# Patient Record
Sex: Female | Born: 1950 | Race: White | Hispanic: No | Marital: Married | Smoking: Former smoker
Health system: Southern US, Community
[De-identification: ages and names within clinical notes are randomized; demographics above are authoritative.]

## PROBLEM LIST (undated history)

## (undated) DIAGNOSIS — K219 Gastro-esophageal reflux disease without esophagitis: Secondary | ICD-10-CM

## (undated) DIAGNOSIS — H269 Unspecified cataract: Secondary | ICD-10-CM

## (undated) DIAGNOSIS — K635 Polyp of colon: Secondary | ICD-10-CM

## (undated) DIAGNOSIS — J329 Chronic sinusitis, unspecified: Secondary | ICD-10-CM

## (undated) DIAGNOSIS — N952 Postmenopausal atrophic vaginitis: Secondary | ICD-10-CM

## (undated) DIAGNOSIS — G5600 Carpal tunnel syndrome, unspecified upper limb: Secondary | ICD-10-CM

## (undated) DIAGNOSIS — T8859XA Other complications of anesthesia, initial encounter: Secondary | ICD-10-CM

## (undated) DIAGNOSIS — Z8719 Personal history of other diseases of the digestive system: Secondary | ICD-10-CM

## (undated) DIAGNOSIS — K439 Ventral hernia without obstruction or gangrene: Secondary | ICD-10-CM

## (undated) DIAGNOSIS — T753XXA Motion sickness, initial encounter: Secondary | ICD-10-CM

## (undated) DIAGNOSIS — E785 Hyperlipidemia, unspecified: Secondary | ICD-10-CM

## (undated) DIAGNOSIS — F419 Anxiety disorder, unspecified: Secondary | ICD-10-CM

## (undated) DIAGNOSIS — K5790 Diverticulosis of intestine, part unspecified, without perforation or abscess without bleeding: Secondary | ICD-10-CM

## (undated) DIAGNOSIS — Z9889 Other specified postprocedural states: Secondary | ICD-10-CM

## (undated) DIAGNOSIS — N6019 Diffuse cystic mastopathy of unspecified breast: Secondary | ICD-10-CM

## (undated) DIAGNOSIS — M1990 Unspecified osteoarthritis, unspecified site: Secondary | ICD-10-CM

## (undated) DIAGNOSIS — K2900 Acute gastritis without bleeding: Secondary | ICD-10-CM

## (undated) DIAGNOSIS — Z780 Asymptomatic menopausal state: Secondary | ICD-10-CM

## (undated) DIAGNOSIS — R112 Nausea with vomiting, unspecified: Secondary | ICD-10-CM

## (undated) DIAGNOSIS — F32A Depression, unspecified: Secondary | ICD-10-CM

## (undated) HISTORY — PX: COSMETIC SURGERY: SHX468

## (undated) HISTORY — DX: Ventral hernia without obstruction or gangrene: K43.9

## (undated) HISTORY — DX: Polyp of colon: K63.5

## (undated) HISTORY — PX: OTHER SURGICAL HISTORY: SHX169

## (undated) HISTORY — DX: Diffuse cystic mastopathy of unspecified breast: N60.19

## (undated) HISTORY — PX: TUBAL LIGATION: SHX77

## (undated) HISTORY — PX: APPENDECTOMY: SHX54

## (undated) HISTORY — DX: Unspecified cataract: H26.9

## (undated) HISTORY — DX: Postmenopausal atrophic vaginitis: N95.2

## (undated) HISTORY — PX: CATARACT EXTRACTION, BILATERAL: SHX1313

## (undated) HISTORY — DX: Anxiety disorder, unspecified: F41.9

## (undated) HISTORY — DX: Diverticulosis of intestine, part unspecified, without perforation or abscess without bleeding: K57.90

## (undated) HISTORY — DX: Chronic sinusitis, unspecified: J32.9

## (undated) HISTORY — PX: HERNIA REPAIR: SHX51

## (undated) HISTORY — DX: Gastro-esophageal reflux disease without esophagitis: K21.9

## (undated) HISTORY — PX: RENAL CYST EXCISION: SUR506

## (undated) HISTORY — DX: Hyperlipidemia, unspecified: E78.5

## (undated) HISTORY — DX: Asymptomatic menopausal state: Z78.0

## (undated) HISTORY — PX: BLEPHAROPLASTY: SUR158

## (undated) HISTORY — PX: EXCISION MORTON'S NEUROMA: SHX5013

## (undated) HISTORY — DX: Acute gastritis without bleeding: K29.00

## (undated) HISTORY — DX: Depression, unspecified: F32.A

## (undated) HISTORY — DX: Unspecified osteoarthritis, unspecified site: M19.90

---

## 2003-02-09 HISTORY — PX: ABDOMINAL HYSTERECTOMY: SHX81

## 2003-09-20 LAB — HM COLONOSCOPY: HM Colonoscopy: NORMAL

## 2004-03-12 ENCOUNTER — Ambulatory Visit: Payer: Self-pay | Admitting: Specialist

## 2006-08-24 ENCOUNTER — Ambulatory Visit: Payer: Self-pay | Admitting: Specialist

## 2008-09-19 ENCOUNTER — Ambulatory Visit: Payer: Self-pay | Admitting: Unknown Physician Specialty

## 2009-01-06 ENCOUNTER — Ambulatory Visit: Payer: Self-pay | Admitting: Unknown Physician Specialty

## 2010-12-08 LAB — HM MAMMOGRAPHY

## 2011-07-21 LAB — CBC AND DIFFERENTIAL
HCT: 42 % (ref 36–46)
Hemoglobin: 14.5 g/dL (ref 12.0–16.0)
Platelets: 226 10*3/uL (ref 150–399)
WBC: 7.3 10^3/mL

## 2011-07-21 LAB — LIPID PANEL: Triglycerides: 307 mg/dL — AB (ref 40–160)

## 2011-07-21 LAB — TSH: TSH: 2.39 u[IU]/mL (ref <=5.90)

## 2011-07-21 LAB — HEPATIC FUNCTION PANEL: AST: 49 U/L — AB (ref 13–35)

## 2011-07-30 LAB — HM PAP SMEAR

## 2011-11-16 ENCOUNTER — Other Ambulatory Visit: Payer: Self-pay | Admitting: *Deleted

## 2011-11-16 MED ORDER — ALPRAZOLAM 0.25 MG PO TABS: 0.2500 mg | ORAL_TABLET | Freq: Two times a day (BID) | ORAL | Status: DC | PRN

## 2011-11-16 NOTE — Telephone Encounter (Signed)
Rx was faxed on 11/12/2011 to pharmacy.

## 2011-11-18 NOTE — Telephone Encounter (Signed)
Pharmacy did not receive RX.  Verbal called to [facility] at [facility].

## 2012-01-05 ENCOUNTER — Telehealth: Payer: Self-pay | Admitting: Internal Medicine

## 2012-01-05 MED ORDER — ESTRADIOL 0.075 MG/24HR TD PTTW: 1.0000 | MEDICATED_PATCH | TRANSDERMAL | Status: DC

## 2012-01-05 NOTE — Telephone Encounter (Signed)
Refill request for vivelle dot 0.075 mg 24 hr. Pat #8 Sig: apply one patch twice a week as directed Patient has an appt on 02/29/12

## 2012-01-05 NOTE — Telephone Encounter (Signed)
Refilled script

## 2012-01-20 ENCOUNTER — Telehealth: Payer: Self-pay | Admitting: Internal Medicine

## 2012-01-20 NOTE — Telephone Encounter (Signed)
Refill request for burpropion xl 150 mg ter #90   Sig: take three tablets daily Patient has an appt. On 02/29/12

## 2012-01-20 NOTE — Telephone Encounter (Signed)
Please confirm with pt this is how she has been taking these meds and refill until her appt.

## 2012-01-20 NOTE — Telephone Encounter (Signed)
Refill request for alprazolam 0.25 mg tab #60 Sig: take 1 tablet twice a day as needed. Patient does have an appt. On 02/29/12

## 2012-01-21 NOTE — Telephone Encounter (Signed)
[facility] pharmacy . Patient confirmed those are the directions on he medication bottles.

## 2012-01-24 MED ORDER — ALPRAZOLAM 0.25 MG PO TABS: 0.2500 mg | ORAL_TABLET | Freq: Every day | ORAL | Status: DC | PRN

## 2012-01-24 MED ORDER — BUPROPION HCL ER (SR) 150 MG PO TB12: 150.0000 mg | ORAL_TABLET | Freq: Three times a day (TID) | ORAL | Status: DC

## 2012-01-24 NOTE — Telephone Encounter (Signed)
Sent in to pharmacy.  

## 2012-01-24 NOTE — Telephone Encounter (Signed)
Directions clarified with pharmacy.  (3/day on wellbutrin - not tid)

## 2012-01-27 ENCOUNTER — Other Ambulatory Visit: Payer: Self-pay | Admitting: *Deleted

## 2012-02-21 ENCOUNTER — Other Ambulatory Visit: Payer: Self-pay | Admitting: Internal Medicine

## 2012-02-21 MED ORDER — PANTOPRAZOLE SODIUM 40 MG PO TBEC: 40.0000 mg | DELAYED_RELEASE_TABLET | Freq: Every day | ORAL | Status: DC

## 2012-02-21 NOTE — Telephone Encounter (Signed)
Pantoprazole Sodium 40 mg  # 30

## 2012-02-21 NOTE — Telephone Encounter (Signed)
Sent in to pharmacy.  

## 2012-02-29 ENCOUNTER — Encounter: Payer: Self-pay | Admitting: Internal Medicine

## 2012-02-29 ENCOUNTER — Encounter: Payer: Self-pay | Admitting: *Deleted

## 2012-02-29 ENCOUNTER — Ambulatory Visit (INDEPENDENT_AMBULATORY_CARE_PROVIDER_SITE_OTHER): Payer: BC Managed Care – PPO | Admitting: Internal Medicine

## 2012-02-29 VITALS — BP 140/88 | HR 77 | Temp 98.4°F | Ht 61.0 in | Wt 151.5 lb

## 2012-02-29 DIAGNOSIS — R7989 Other specified abnormal findings of blood chemistry: Secondary | ICD-10-CM

## 2012-02-29 DIAGNOSIS — R945 Abnormal results of liver function studies: Secondary | ICD-10-CM

## 2012-02-29 DIAGNOSIS — E7800 Pure hypercholesterolemia, unspecified: Secondary | ICD-10-CM

## 2012-02-29 DIAGNOSIS — IMO0001 Reserved for inherently not codable concepts without codable children: Secondary | ICD-10-CM

## 2012-02-29 DIAGNOSIS — R030 Elevated blood-pressure reading, without diagnosis of hypertension: Secondary | ICD-10-CM

## 2012-03-05 ENCOUNTER — Encounter: Payer: Self-pay | Admitting: Internal Medicine

## 2012-03-05 DIAGNOSIS — R7989 Other specified abnormal findings of blood chemistry: Secondary | ICD-10-CM | POA: Insufficient documentation

## 2012-03-05 DIAGNOSIS — R945 Abnormal results of liver function studies: Secondary | ICD-10-CM | POA: Insufficient documentation

## 2012-03-05 DIAGNOSIS — E7800 Pure hypercholesterolemia, unspecified: Secondary | ICD-10-CM | POA: Insufficient documentation

## 2012-03-05 MED ORDER — ESTRADIOL 0.05 MG/24HR TD PTTW: 1.0000 | MEDICATED_PATCH | TRANSDERMAL | Status: DC

## 2012-03-05 NOTE — Assessment & Plan Note (Signed)
Recheck liver panel with next labs.   

## 2012-03-05 NOTE — Assessment & Plan Note (Signed)
Low cholesterol diet and exercise.  Check lipid panel.   

## 2012-03-05 NOTE — Progress Notes (Signed)
Subjective:    [name], female    DOB: 1950/04/17, 62 y.o.   MRN: [id number]  HPI 62 year old female with past history of abnormal liver function, diverticulosis and GERD who comes in today for a scheduled follow up.  She states she has been doing relatively well.  Has had problems with her left index finger.  Seeing Dr Deeann Saint.  Xray yesterday - ganglion joint, bone spur.  Has been on keflex and bactrim for two months.  Has been off abx x 1 week.  Still taking her wellbutrin.  Takes her xanax prn.  (averages 4-5x/week).  Seeing her therapist.  She has started exercising.  Working with a Psychologist, educational two days/week.  Has noticed some intermittent nipple soreness.  States this has occurred previously.  No breast tenderness.  No nipple discharge.  No palpable lumps or nodules.   Past Medical History  Diagnosis Date  . Atrophic vaginitis   . Postmenopausal   . Fibrocystic breast disease   . Sinusitis   . Abdominal wall hernia     secondary to large left renal cyst removal  . GERD (gastroesophageal reflux disease)   . Diverticulosis   . Hyperlipidemia   . Colon polyps     Current Outpatient Prescriptions on File Prior to Visit  Medication Sig Dispense Refill  . ALPRAZolam (XANAX) 0.25 MG tablet Take 0.25 mg by mouth 2 (two) times daily as needed.      Marland Kitchen buPROPion (WELLBUTRIN SR) 150 MG 12 hr tablet Take 1 tablet (150 mg total) by mouth 3 (three) times daily.  90 tablet  1  . estradiol (VIVELLE-DOT) 0.075 MG/24HR Place 1 patch onto the skin 2 (two) times a week.  8 patch  3  . fluticasone (FLONASE) 50 MCG/ACT nasal spray Place 2 sprays into the nose daily.      . pantoprazole (PROTONIX) 40 MG tablet Take 1 tablet (40 mg total) by mouth daily.  30 tablet  0    Review of Systems Patient denies any headache, lightheadedness or dizziness.  No significant sinus or allergy symptoms.   No chest pain, tightness or palpitations.  No increased shortness of breath, cough or congestion.   No nausea or vomiting.  No abdominal pain or cramping.  No bowel change, such as diarrhea, constipation, BRBPR or melana.  No urine change.        Objective:   Physical Exam Filed Vitals:   02/29/12 1534  BP: 140/88  Pulse: 77  Temp: 98.4 F (17.54 C)   62  year old female in no acute distress.   HEENT:  Nares- clear.  Oropharynx - without lesions. NECK:  Supple.  Nontender.  No audible bruit.  HEART:  Appears to be regular. LUNGS:  No crackles or wheezing audible.  Respirations even and unlabored.  RADIAL PULSE:  Equal bilaterally.    BREASTS:  She declined.   ABDOMEN:  Soft, nontender.  Bowel sounds present and normal.  No audible abdominal bruit.    EXTREMITIES:  No increased edema present.  DP pulses palpable and equal bilaterally.      FINGER:  Left index.  Increased fullness of the joint.  No evidence of infection.  Tender to palpation.      Assessment & Plan:  MSK.  Finger changes as outlined.  Seeing Dr Deeann Saint.  Follow.   With increased abx usage - probiotic.  Off abx now.  Follow.    NIPPLE SORENESS.  Discussed at length with  her today.  She declined breast exam.  States no change.  Will decrease the estrogen patch and try to wean her off.  Follow.  Last mammo - ok.   INCREASED PSYCHOSOCIAL STRESSORS.  Seeing Dr Dawayne Cirri.  On Wellbutrin and xanax prn.  Stable.  Follow.    ETOH ABUSE.  Discussed the need to decrease the amount.  Follow.  ELEVATED BLOOD PRESSURE WITHOUT DIAGNOSIS OF HYPERTENSION.  Blood pressure as outlined.  Have her spot check her readings.  Follow.   Check metabolic panel.    HEALTH MAINTENANCE.  Physical 06/09/11.  Colonoscopy 09/19/08 - internal hemorrhoids and diverticulosis.  Recommended follow up colonoscopy five years.  Obtain mammogram results for 2013.

## 2012-03-14 ENCOUNTER — Encounter: Payer: Self-pay | Admitting: Internal Medicine

## 2012-03-20 ENCOUNTER — Other Ambulatory Visit: Payer: Self-pay | Admitting: *Deleted

## 2012-03-21 MED ORDER — PANTOPRAZOLE SODIUM 40 MG PO TBEC: 40.0000 mg | DELAYED_RELEASE_TABLET | Freq: Every day | ORAL | Status: DC

## 2012-03-21 MED ORDER — BUPROPION HCL ER (SR) 150 MG PO TB12: 150.0000 mg | ORAL_TABLET | Freq: Three times a day (TID) | ORAL | Status: DC

## 2012-03-21 NOTE — Telephone Encounter (Signed)
Sent in to pharmacy.  

## 2012-03-22 MED ORDER — BUPROPION HCL ER (SR) 150 MG PO TB12: ORAL_TABLET | ORAL | Status: DC

## 2012-03-22 MED ORDER — ESTRADIOL 0.05 MG/24HR TD PTTW: MEDICATED_PATCH | TRANSDERMAL | Status: DC

## 2012-03-22 NOTE — Telephone Encounter (Signed)
Clarified wellbutrin directions.  Refill vivelle dot

## 2012-03-25 ENCOUNTER — Other Ambulatory Visit: Payer: Self-pay

## 2012-04-05 ENCOUNTER — Other Ambulatory Visit: Payer: Self-pay | Admitting: Internal Medicine

## 2012-04-05 MED ORDER — ALPRAZOLAM 0.25 MG PO TABS: 0.2500 mg | ORAL_TABLET | Freq: Two times a day (BID) | ORAL | Status: DC | PRN

## 2012-04-05 NOTE — Telephone Encounter (Signed)
Order placed for refill xanax x 1

## 2012-05-12 ENCOUNTER — Encounter: Payer: Self-pay | Admitting: Specialist

## 2012-05-19 ENCOUNTER — Other Ambulatory Visit: Payer: Self-pay | Admitting: *Deleted

## 2012-05-19 MED ORDER — BUPROPION HCL ER (SR) 150 MG PO TB12: ORAL_TABLET | ORAL | Status: DC

## 2012-05-19 NOTE — Telephone Encounter (Signed)
Please advise... Refill on Wellbutrin... Patient hasnt had labs done yet but is schedule for labs in May.

## 2012-05-23 ENCOUNTER — Other Ambulatory Visit: Payer: Self-pay | Admitting: Internal Medicine

## 2012-05-23 ENCOUNTER — Telehealth: Payer: Self-pay | Admitting: Internal Medicine

## 2012-05-23 MED ORDER — ALPRAZOLAM 0.25 MG PO TABS: 0.2500 mg | ORAL_TABLET | Freq: Two times a day (BID) | ORAL | Status: DC | PRN

## 2012-05-23 NOTE — Telephone Encounter (Signed)
Called in xanax .25mg  #60 with no refills and wellbutrin #90 with 3 refills.  To [facility].  Can notify pt.

## 2012-05-23 NOTE — Telephone Encounter (Signed)
Called pharmacy and clarified dosing and refills  See other phone message

## 2012-05-23 NOTE — Telephone Encounter (Signed)
Patient is leaving town tomorrow needing her prescriptions  buPROPion (WELLBUTRIN SR) 150 MG 12 hr tablet  #90  ALPRAZolam (XANAX) 0.25 MG tablet  #60

## 2012-05-23 NOTE — Telephone Encounter (Signed)
Please advise..... Patient wants refill on his prescriptions because he is leaving town tomorrow.  Bupropion (Wellbutrin SR) 150 mg 12 hr  tablet # 90  Alprazolom (Xanax) 0.25 mg tablet  [facility] in [place]

## 2012-05-23 NOTE — Telephone Encounter (Signed)
Pt notified that her prescriptions were to the pharmacy.

## 2012-06-23 ENCOUNTER — Other Ambulatory Visit (INDEPENDENT_AMBULATORY_CARE_PROVIDER_SITE_OTHER): Payer: BC Managed Care – PPO

## 2012-06-23 DIAGNOSIS — IMO0001 Reserved for inherently not codable concepts without codable children: Secondary | ICD-10-CM

## 2012-06-23 DIAGNOSIS — R030 Elevated blood-pressure reading, without diagnosis of hypertension: Secondary | ICD-10-CM

## 2012-06-23 DIAGNOSIS — E7800 Pure hypercholesterolemia, unspecified: Secondary | ICD-10-CM

## 2012-06-23 DIAGNOSIS — R7989 Other specified abnormal findings of blood chemistry: Secondary | ICD-10-CM

## 2012-06-23 DIAGNOSIS — R945 Abnormal results of liver function studies: Secondary | ICD-10-CM

## 2012-06-23 LAB — BASIC METABOLIC PANEL
BUN: 14 mg/dL (ref 6–23)
CO2: 23 mEq/L (ref 19–32)
GFR: 66.02 mL/min (ref >60.00)
Glucose, Bld: 94 mg/dL (ref 70–99)
Potassium: 4.3 mEq/L (ref 3.5–5.1)

## 2012-06-23 LAB — LIPID PANEL
Total CHOL/HDL Ratio: 4
VLDL: 53.2 mg/dL — ABNORMAL HIGH (ref 0.0–40.0)

## 2012-06-23 LAB — HEPATIC FUNCTION PANEL
Alkaline Phosphatase: 50 U/L (ref 39–117)
Bilirubin, Direct: 0.1 mg/dL (ref 0.0–0.3)
Total Bilirubin: 0.6 mg/dL (ref 0.3–1.2)

## 2012-06-27 ENCOUNTER — Ambulatory Visit (INDEPENDENT_AMBULATORY_CARE_PROVIDER_SITE_OTHER): Payer: BC Managed Care – PPO | Admitting: Internal Medicine

## 2012-06-27 ENCOUNTER — Encounter: Payer: Self-pay | Admitting: Internal Medicine

## 2012-06-27 VITALS — BP 110/70 | HR 79 | Temp 98.2°F | Ht 61.5 in | Wt 150.5 lb

## 2012-06-27 DIAGNOSIS — Z1211 Encounter for screening for malignant neoplasm of colon: Secondary | ICD-10-CM

## 2012-06-27 DIAGNOSIS — R945 Abnormal results of liver function studies: Secondary | ICD-10-CM

## 2012-06-27 DIAGNOSIS — Q619 Cystic kidney disease, unspecified: Secondary | ICD-10-CM

## 2012-06-27 DIAGNOSIS — E7800 Pure hypercholesterolemia, unspecified: Secondary | ICD-10-CM

## 2012-06-27 DIAGNOSIS — N281 Cyst of kidney, acquired: Secondary | ICD-10-CM

## 2012-06-27 DIAGNOSIS — Z0000 Encounter for general adult medical examination without abnormal findings: Secondary | ICD-10-CM

## 2012-06-27 DIAGNOSIS — R197 Diarrhea, unspecified: Secondary | ICD-10-CM

## 2012-06-27 DIAGNOSIS — R7989 Other specified abnormal findings of blood chemistry: Secondary | ICD-10-CM

## 2012-06-30 ENCOUNTER — Encounter: Payer: BC Managed Care – PPO | Admitting: Internal Medicine

## 2012-07-03 ENCOUNTER — Encounter: Payer: Self-pay | Admitting: Internal Medicine

## 2012-07-03 DIAGNOSIS — N281 Cyst of kidney, acquired: Secondary | ICD-10-CM | POA: Insufficient documentation

## 2012-07-03 NOTE — Assessment & Plan Note (Signed)
Low cholesterol diet and exercise.  Check lipid panel.   

## 2012-07-03 NOTE — Progress Notes (Signed)
Subjective:    [name], female    DOB: 07-22-1950, 62 y.o.   MRN: [id number]  HPI 62 year old female with past history of abnormal liver function, diverticulosis and GERD who comes in today to follow up on these issues as well as for a complete physical exam.  She states she has been doing relatively well.  Has had problems with her left index finger.  Has been seeing Dr Deeann Saint.  Told she had a ganglion joint and bone spur.  Was on keflex and bactrim for two months.  Just recently followed up with Dr Hyacinth Meeker.  He is referring her to a hand specialist and [facility] (Dr Victorino December).  Still taking her wellbutrin.  Takes her xanax prn.  (averages 4-5x/week).   Had noticed some intermittent nipple soreness last visit.  This has resolved.  Overall she feels things are stable.  Is having some persistent loose stool.     Past Medical History  Diagnosis Date  . Atrophic vaginitis   . Postmenopausal   . Fibrocystic breast disease   . Sinusitis   . Abdominal wall hernia     secondary to large left renal cyst removal  . GERD (gastroesophageal reflux disease)   . Diverticulosis   . Hyperlipidemia   . Colon polyps     Current Outpatient Prescriptions on File Prior to Visit  Medication Sig Dispense Refill  . ALPRAZolam (XANAX) 0.25 MG tablet Take 1 tablet (0.25 mg total) by mouth 2 (two) times daily as needed.  60 tablet  0  . buPROPion (WELLBUTRIN SR) 150 MG 12 hr tablet Take three tablets per day  90 tablet  1  . estradiol (VIVELLE-DOT) 0.05 MG/24HR Change one patch 2x/week as directed.  8 patch  3  . pantoprazole (PROTONIX) 40 MG tablet Take 1 tablet (40 mg total) by mouth daily.  30 tablet  5  . fluticasone (FLONASE) 50 MCG/ACT nasal spray Place 2 sprays into the nose daily.       No current facility-administered medications on file prior to visit.    Review of Systems Patient denies any headache, lightheadedness or dizziness.  No significant sinus or allergy symptoms.   No chest  pain, tightness or palpitations.  No increased shortness of breath, cough or congestion.  No nausea or vomiting.  No acid reflux.  No abdominal pain or cramping.  No bowel change such as constipation, BRBPR or melana.  Does report some persistent loose stool.  Had been on an increased amount of abx recently.  No urine change.  Nipple soreness has resolved.  Planning to f/u with urology 07/20/12.  Sees Dr Creed Copper at [facility]       Objective:   Physical Exam  Filed Vitals:   06/27/12 0833  BP: 110/70  Pulse: 79  Temp: 98.2 F (84.51 C)   62 year old female in no acute distress.   HEENT:  Nares- clear.  Oropharynx - without lesions. NECK:  Supple.  Nontender.  No audible bruit.  HEART:  Appears to be regular. LUNGS:  No crackles or wheezing audible.  Respirations even and unlabored.  RADIAL PULSE:  Equal bilaterally.    BREASTS:  No nipple discharge or nipple retraction present.  Could not appreciate any distinct nodules or axillary adenopathy.  ABDOMEN:  Soft, nontender.  Bowel sounds present and normal.  No audible abdominal bruit.  GU:  Normal external genitalia.  Vaginal vault without lesions.  S/p hysterectomy.  Could not appreciate any  adnexal masses or tenderness.   RECTAL:  Heme negative.   EXTREMITIES:  No increased edema present.  DP pulses palpable and equal bilaterally.          Assessment & Plan:  MSK.  Finger issues as outlined.  Has been seeing Dr Deeann Saint.  Planning to see a hand specialist at [facility].    NIPPLE SORENESS.  Resolved.     INCREASED PSYCHOSOCIAL STRESSORS.  Has been seeing Dr Dawayne Cirri.  On Wellbutrin and xanax prn.  Stable.  Follow.    ETOH ABUSE.  Again discussed the need to decrease the amount.  Follow.  ELEVATED BLOOD PRESSURE WITHOUT DIAGNOSIS OF HYPERTENSION.  Have her spot check her readings.  Follow.   Follow metabolic panel.    GI.  Persistent loose stool.  Given recent increased abx usage, will check stool for C. Diff.    HEALTH  MAINTENANCE.  Physical today.  Colonoscopy 09/19/08 - internal hemorrhoids and diverticulosis.  Recommended follow up colonoscopy five years.  IFOB today.  Mammogram 12/24/11 - Birads II.

## 2012-07-03 NOTE — Assessment & Plan Note (Signed)
Followed by urology.  Planning to f/u with urology [facility] - Dr Creed Copper) 6/121/4.  States will do ultrasound then.  Plans to get abdominal ultrasound as well so can evaluate kidney and liver - given increased LFTs.

## 2012-07-03 NOTE — Assessment & Plan Note (Signed)
Recheck liver panel with next labs.  Discussed with her my desire for further w/up including a f/u ultrasound.  Will obtain ultrasound from [facility].  She also informs me that her urologist will do an ultrasound at her f/u 07/20/12.  Will get them to also look at liver then.  Follow.

## 2012-08-12 ENCOUNTER — Other Ambulatory Visit: Payer: Self-pay | Admitting: Internal Medicine

## 2012-08-14 ENCOUNTER — Other Ambulatory Visit: Payer: Self-pay | Admitting: *Deleted

## 2012-08-14 NOTE — Telephone Encounter (Signed)
Okay to refill? (Pt aware to check with pharmacy on Wednesday)

## 2012-08-15 ENCOUNTER — Telehealth: Payer: Self-pay | Admitting: *Deleted

## 2012-08-15 MED ORDER — ALPRAZOLAM 0.25 MG PO TABS: 0.2500 mg | ORAL_TABLET | Freq: Two times a day (BID) | ORAL | Status: DC | PRN

## 2012-08-15 NOTE — Telephone Encounter (Signed)
**  Correction** Phoned in Xanax RX to [facility]

## 2012-08-15 NOTE — Telephone Encounter (Signed)
Refilled xanax #60 with no refills. Ok to call in.

## 2012-08-15 NOTE — Telephone Encounter (Signed)
Called in to [facility] 

## 2012-08-17 ENCOUNTER — Other Ambulatory Visit: Payer: Self-pay | Admitting: *Deleted

## 2012-09-15 ENCOUNTER — Other Ambulatory Visit: Payer: Self-pay | Admitting: *Deleted

## 2012-09-15 MED ORDER — BUPROPION HCL ER (SR) 150 MG PO TB12: ORAL_TABLET | ORAL | Status: DC

## 2012-09-21 ENCOUNTER — Other Ambulatory Visit: Payer: Self-pay | Admitting: *Deleted

## 2012-09-21 MED ORDER — PANTOPRAZOLE SODIUM 40 MG PO TBEC: 40.0000 mg | DELAYED_RELEASE_TABLET | Freq: Every day | ORAL | Status: DC

## 2012-10-17 ENCOUNTER — Other Ambulatory Visit: Payer: Self-pay | Admitting: *Deleted

## 2012-10-17 ENCOUNTER — Encounter: Payer: Self-pay | Admitting: Internal Medicine

## 2012-10-17 MED ORDER — ALPRAZOLAM 0.25 MG PO TABS: 0.2500 mg | ORAL_TABLET | Freq: Two times a day (BID) | ORAL | Status: DC | PRN

## 2012-10-17 MED ORDER — BUPROPION HCL ER (SR) 150 MG PO TB12: ORAL_TABLET | ORAL | Status: DC

## 2012-10-17 NOTE — Telephone Encounter (Signed)
Okay to refill? Has appt later this month (9/26) Can sign controlled substance contract at that time

## 2012-10-17 NOTE — Telephone Encounter (Signed)
Refilled wellbutrin #90 with no refills and xanax #69 with no refills.

## 2012-10-30 ENCOUNTER — Telehealth: Payer: Self-pay | Admitting: *Deleted

## 2012-10-30 ENCOUNTER — Other Ambulatory Visit (INDEPENDENT_AMBULATORY_CARE_PROVIDER_SITE_OTHER): Payer: BC Managed Care – PPO

## 2012-10-30 DIAGNOSIS — N281 Cyst of kidney, acquired: Secondary | ICD-10-CM

## 2012-10-30 DIAGNOSIS — R945 Abnormal results of liver function studies: Secondary | ICD-10-CM

## 2012-10-30 DIAGNOSIS — R7989 Other specified abnormal findings of blood chemistry: Secondary | ICD-10-CM

## 2012-10-30 DIAGNOSIS — E7800 Pure hypercholesterolemia, unspecified: Secondary | ICD-10-CM

## 2012-10-30 DIAGNOSIS — Q619 Cystic kidney disease, unspecified: Secondary | ICD-10-CM

## 2012-10-30 LAB — HEPATIC FUNCTION PANEL
ALT: 87 U/L — ABNORMAL HIGH (ref 0–35)
AST: 63 U/L — ABNORMAL HIGH (ref 0–37)
Alkaline Phosphatase: 57 U/L (ref 39–117)
Bilirubin, Direct: 0.1 mg/dL (ref 0.0–0.3)
Total Bilirubin: 1 mg/dL (ref 0.3–1.2)
Total Protein: 7.1 g/dL (ref 6.0–8.3)

## 2012-10-30 LAB — CBC WITH DIFFERENTIAL/PLATELET
Basophils Relative: 0.6 % (ref 0.0–3.0)
Eosinophils Relative: 2.1 % (ref 0.0–5.0)
Hemoglobin: 13.9 g/dL (ref 12.0–15.0)
Lymphocytes Relative: 20.5 % (ref 12.0–46.0)
MCHC: 33.8 g/dL (ref 30.0–36.0)
MCV: 97.8 fl (ref 78.0–100.0)
Neutro Abs: 5.2 10*3/uL (ref 1.4–7.7)
Neutrophils Relative %: 69.4 % (ref 43.0–77.0)
RBC: 4.2 Mil/uL (ref 3.87–5.11)
WBC: 7.4 10*3/uL (ref 4.5–10.5)

## 2012-10-30 LAB — LIPID PANEL
Total CHOL/HDL Ratio: 4
VLDL: 77.8 mg/dL — ABNORMAL HIGH (ref 0.0–40.0)

## 2012-10-30 LAB — BASIC METABOLIC PANEL
BUN: 14 mg/dL (ref 6–23)
Chloride: 102 mEq/L (ref 96–112)
Creatinine, Ser: 0.9 mg/dL (ref 0.4–1.2)
Glucose, Bld: 92 mg/dL (ref 70–99)
Potassium: 4.3 mEq/L (ref 3.5–5.1)

## 2012-10-30 LAB — LDL CHOLESTEROL, DIRECT: Direct LDL: 116.1 mg/dL

## 2012-10-30 NOTE — Telephone Encounter (Signed)
Orders placed for labs

## 2012-10-30 NOTE — Telephone Encounter (Signed)
What labs and dx?  

## 2012-10-31 ENCOUNTER — Encounter: Payer: Self-pay | Admitting: Internal Medicine

## 2012-11-02 ENCOUNTER — Encounter: Payer: Self-pay | Admitting: *Deleted

## 2012-11-03 ENCOUNTER — Encounter: Payer: Self-pay | Admitting: Internal Medicine

## 2012-11-03 ENCOUNTER — Ambulatory Visit (INDEPENDENT_AMBULATORY_CARE_PROVIDER_SITE_OTHER): Payer: BC Managed Care – PPO | Admitting: Internal Medicine

## 2012-11-03 VITALS — BP 118/78 | HR 80 | Temp 98.4°F | Ht 61.5 in | Wt 152.5 lb

## 2012-11-03 DIAGNOSIS — M7989 Other specified soft tissue disorders: Secondary | ICD-10-CM

## 2012-11-03 DIAGNOSIS — Q619 Cystic kidney disease, unspecified: Secondary | ICD-10-CM

## 2012-11-03 DIAGNOSIS — N951 Menopausal and female climacteric states: Secondary | ICD-10-CM

## 2012-11-03 DIAGNOSIS — R7989 Other specified abnormal findings of blood chemistry: Secondary | ICD-10-CM

## 2012-11-03 DIAGNOSIS — Z23 Encounter for immunization: Secondary | ICD-10-CM

## 2012-11-03 DIAGNOSIS — R945 Abnormal results of liver function studies: Secondary | ICD-10-CM

## 2012-11-03 DIAGNOSIS — E7800 Pure hypercholesterolemia, unspecified: Secondary | ICD-10-CM

## 2012-11-03 DIAGNOSIS — N281 Cyst of kidney, acquired: Secondary | ICD-10-CM

## 2012-11-03 DIAGNOSIS — R232 Flushing: Secondary | ICD-10-CM

## 2012-11-03 LAB — URINALYSIS, ROUTINE W REFLEX MICROSCOPIC
Bilirubin Urine: NEGATIVE
Ketones, ur: NEGATIVE
Nitrite: NEGATIVE
Specific Gravity, Urine: 1.015 (ref 1.000–1.030)
Total Protein, Urine: NEGATIVE
Urine Glucose: NEGATIVE
pH: 6 (ref 5.0–8.0)

## 2012-11-03 MED ORDER — ALPRAZOLAM 0.25 MG PO TABS: 0.2500 mg | ORAL_TABLET | Freq: Two times a day (BID) | ORAL | Status: DC | PRN

## 2012-11-05 ENCOUNTER — Encounter: Payer: Self-pay | Admitting: Internal Medicine

## 2012-11-05 DIAGNOSIS — R232 Flushing: Secondary | ICD-10-CM | POA: Insufficient documentation

## 2012-11-05 NOTE — Progress Notes (Signed)
Subjective:    [name], female    DOB: 1950/03/29, 62 y.o.   MRN: [id number]  HPI 62 year old female with past history of abnormal liver function, diverticulosis and GERD who comes in today for a scheduled follow up.  She states she has been doing relatively well.  Has had problems with her left index finger.  Has been seeing Dr Deeann Saint.  Told she had a ganglion joint and bone spur.  Was on keflex and bactrim for two months.  She was referred to a hand specialist at [facility] (Dr Victorino December).  He felt no further w/up or intervention warranted.  She has noticed that she has started getting a lesion on her third finger as well.  She feels related to arthritis.  Request referral to rheumatology for evaluation.  Still taking her wellbutrin.  Takes her xanax prn.  (averages 4-5x/week).   Overall she feels things are stable.  Is having some persistent loose stool.  She feels related to nerves.  Stools more formed now.  Completely off estrogen.  Some hot flashes.  Discussed vitamin E supplements.  Sees urology regularly.  They do periodic ultrasounds.  Fatty liver found on ultrasounds. Liver function elevated.  Discussed further w/up.  Discussed referral to GI.  She declined at this time.     Past Medical History  Diagnosis Date  . Atrophic vaginitis   . Postmenopausal   . Fibrocystic breast disease   . Sinusitis   . Abdominal wall hernia     secondary to large left renal cyst removal  . GERD (gastroesophageal reflux disease)   . Diverticulosis   . Hyperlipidemia   . Colon polyps     Current Outpatient Prescriptions on File Prior to Visit  Medication Sig Dispense Refill  . buPROPion (WELLBUTRIN SR) 150 MG 12 hr tablet Take three tablets per day  90 tablet  0  . pantoprazole (PROTONIX) 40 MG tablet Take 1 tablet (40 mg total) by mouth daily.  30 tablet  5   No current facility-administered medications on file prior to visit.    Review of Systems Patient denies any headache,  lightheadedness or dizziness.  No significant sinus or allergy symptoms.   No chest pain, tightness or palpitations.  No increased shortness of breath, cough or congestion.  No nausea or vomiting.  No acid reflux.  No abdominal pain or cramping.  No bowel change such as constipation, BRBPR or melana.  Does report some persistent loose stool.  More formed now.  She feels related to nerves.   No urine change.   Sees Dr Creed Copper at [facility].  Need ultrasound results.  Off estrogen.  Hot flashes.       Objective:   Physical Exam  Filed Vitals:   11/03/12 0936  BP: 118/78  Pulse: 80  Temp: 98.4 F (58.23 C)   62 year old female in no acute distress.   HEENT:  Nares- clear.  Oropharynx - without lesions. NECK:  Supple.  Nontender.  No audible bruit.  HEART:  Appears to be regular. LUNGS:  No crackles or wheezing audible.  Respirations even and unlabored.  RADIAL PULSE:  Equal bilaterally.   ABDOMEN:  Soft, nontender.  Bowel sounds present and normal.  No audible abdominal bruit.    EXTREMITIES:  No increased edema present.  DP pulses palpable and equal bilaterally.          Assessment & Plan:  MSK.  Finger issues as outlined.  Had  been seeing Dr Deeann Saint.  Saw a hand specialist at [facility].  No further w/up felt warranted by the surgeon.  She request referral to rheumatology for evaluation.    INCREASED PSYCHOSOCIAL STRESSORS.  Has been seeing Dr Dawayne Cirri.  On Wellbutrin and xanax prn.  Stable.  Follow.    ETOH ABUSE.  Again discussed the need to decrease the amount.  Follow.  She has decreased her intake.    ELEVATED BLOOD PRESSURE WITHOUT DIAGNOSIS OF HYPERTENSION. Blood pressures ok. Have her to continue to spot check her readings.  Follow.   Follow metabolic panel.    GI.  Persistent loose stool.  More formed now. She desires no further w/up at this time.  Probiotic daily.     HEALTH MAINTENANCE.  Physical 06/27/12.   Colonoscopy 09/19/08 - internal hemorrhoids and diverticulosis.   Recommended follow up colonoscopy five years.   Mammogram 12/24/11 - Birads II.

## 2012-11-05 NOTE — Assessment & Plan Note (Signed)
Vitamin E as directed.  Follow.

## 2012-11-05 NOTE — Assessment & Plan Note (Signed)
Low cholesterol diet and exercise.  Follow lipid panel.   

## 2012-11-05 NOTE — Assessment & Plan Note (Signed)
Recheck liver panel with next labs.  Recent ast 63 and alt 87.  Obtain results of abdominal ultrasound from [facility].  She states have reported fatty liver on multiple ultrasounds she has had at [facility].  In reviewing her 2010 ultrasound from [facility] - no fatty liver noted.  Wanted to refer to GI for further w/up and evaluation.  She declined.  Will follow.  Wants to work on diet and exercise.  Start vitamin E.

## 2012-11-05 NOTE — Assessment & Plan Note (Signed)
Followed by urology.  Is evaluated at [facility] - Dr Creed Copper.  6/121/4.  Felt things were stable regarding her renal cysts.  Obtain ultrasound results.

## 2012-11-20 ENCOUNTER — Encounter: Payer: Self-pay | Admitting: Internal Medicine

## 2012-12-21 ENCOUNTER — Other Ambulatory Visit: Payer: Self-pay | Admitting: Internal Medicine

## 2012-12-29 ENCOUNTER — Encounter: Payer: Self-pay | Admitting: Internal Medicine

## 2013-03-02 ENCOUNTER — Telehealth: Payer: Self-pay | Admitting: Emergency Medicine

## 2013-03-02 NOTE — Telephone Encounter (Signed)
Pt.notified

## 2013-03-02 NOTE — Telephone Encounter (Signed)
Patient was around friend earlier part of the week. Patients friend now has the flu. Pt is not feeling well herself, sore throat, nasal congestion, no fever, no chills. The patient is wondering if she should start tamiflu or be seen? Please advise

## 2013-03-02 NOTE — Telephone Encounter (Signed)
Duplicate- see other message

## 2013-03-02 NOTE — Telephone Encounter (Signed)
With no fever and typical flu symptoms - I would not just assume influenza.  I would recommend an evaluation to determine etiology and then can treat appropriately.  Either acute care today or [facility] tomorrow.

## 2013-03-02 NOTE — Telephone Encounter (Signed)
Please advise 

## 2013-03-05 ENCOUNTER — Other Ambulatory Visit: Payer: Self-pay | Admitting: Internal Medicine

## 2013-03-05 ENCOUNTER — Ambulatory Visit (INDEPENDENT_AMBULATORY_CARE_PROVIDER_SITE_OTHER): Payer: BC Managed Care – PPO | Admitting: Internal Medicine

## 2013-03-05 ENCOUNTER — Encounter: Payer: Self-pay | Admitting: Internal Medicine

## 2013-03-05 VITALS — BP 130/80 | HR 84 | Temp 98.1°F | Ht 61.5 in | Wt 152.5 lb

## 2013-03-05 DIAGNOSIS — R7989 Other specified abnormal findings of blood chemistry: Secondary | ICD-10-CM

## 2013-03-05 DIAGNOSIS — N281 Cyst of kidney, acquired: Secondary | ICD-10-CM

## 2013-03-05 DIAGNOSIS — Q619 Cystic kidney disease, unspecified: Secondary | ICD-10-CM

## 2013-03-05 DIAGNOSIS — N951 Menopausal and female climacteric states: Secondary | ICD-10-CM

## 2013-03-05 DIAGNOSIS — E7800 Pure hypercholesterolemia, unspecified: Secondary | ICD-10-CM

## 2013-03-05 DIAGNOSIS — R945 Abnormal results of liver function studies: Secondary | ICD-10-CM

## 2013-03-05 DIAGNOSIS — R232 Flushing: Secondary | ICD-10-CM

## 2013-03-05 DIAGNOSIS — F439 Reaction to severe stress, unspecified: Secondary | ICD-10-CM | POA: Insufficient documentation

## 2013-03-05 DIAGNOSIS — J329 Chronic sinusitis, unspecified: Secondary | ICD-10-CM

## 2013-03-05 MED ORDER — PANTOPRAZOLE SODIUM 40 MG PO TBEC: 40.0000 mg | DELAYED_RELEASE_TABLET | Freq: Every day | ORAL | Status: DC

## 2013-03-05 MED ORDER — ALPRAZOLAM 0.25 MG PO TABS: 0.2500 mg | ORAL_TABLET | Freq: Two times a day (BID) | ORAL | Status: DC | PRN

## 2013-03-05 MED ORDER — BUPROPION HCL ER (XL) 150 MG PO TB24
ORAL_TABLET | ORAL | Status: DC
Start: 2013-03-05 — End: 2013-07-16

## 2013-03-05 MED ORDER — FLUTICASONE PROPIONATE 50 MCG/ACT NA SUSP: 2.0000 | Freq: Every day | NASAL | Status: DC

## 2013-03-05 NOTE — Assessment & Plan Note (Signed)
Recheck liver panel with next labs. Has abdominal ultrasounds at [facility].  She states have reported fatty liver on multiple ultrasounds she has had at [facility].  In reviewing her 2010 ultrasound from [facility] - no fatty liver noted.  Wanted to refer to GI for further w/up and evaluation.  She declined.  Will follow.  Wants to work on diet and exercise.  On vitamin E.   Recheck liver panel.

## 2013-03-05 NOTE — Progress Notes (Signed)
Pre-visit discussion using our clinic review tool. No additional management support is needed unless otherwise documented below in the visit note.  

## 2013-03-05 NOTE — Assessment & Plan Note (Signed)
Low cholesterol diet and exercise.  Follow lipid panel.   

## 2013-03-05 NOTE — Patient Instructions (Signed)
Saline nasal spray - flush nose at least 2-3x/day.  Flonase nasal spray - 2 sprays each nostril one time per day.  (do this in the evening).  Mucinex DM in the am and Robitussin DM in the evening.

## 2013-03-05 NOTE — Progress Notes (Signed)
Order placed for f/u labs.  

## 2013-03-05 NOTE — Assessment & Plan Note (Signed)
Followed by urology.  Is evaluated at [facility] - Dr Hemel.  6/121/4.  Felt things were stable regarding her renal cysts.  Continues f/u at [facility].    

## 2013-03-05 NOTE — Assessment & Plan Note (Signed)
On amoxicillin and prednisone taper.  flonase nasal spray and saline nasal spray as outlined.  mucinex DM and robitussin DM as outlined.  Follow.

## 2013-03-05 NOTE — Assessment & Plan Note (Signed)
Discussed at length with her today.  Discussed treatment options.  She will monitor for now.  Follow.  On vitamin E.

## 2013-03-05 NOTE — Progress Notes (Signed)
Subjective:    [name], female    DOB: October 31, 1950, 63 y.o.   MRN: [id number]  HPI 63 year old female with past history of abnormal liver function, diverticulosis and GERD who comes in today for a scheduled follow up.  She states she has been doing relatively well.   Still taking her wellbutrin.  Takes her xanax prn.  (averages 4-5x/week).   Overall she feels things are stable.  Still seeing her therapist.  [name] this is going well.  Is completely off estrogen.  Some hot flashes.  Taking vitamin E supplements.  Still with persistent hot flashes.  discussed other treatment options.  She will follow for now.  Sees urology regularly.  They do periodic ultrasounds.  Fatty liver found on ultrasounds. Liver function previously elevated.  Last check wnl.  Will follow.   Have discussed further w/up.  Have discussed referral to GI.  She declined.  She is currently being treated for sinus infection.  Was seen two days ago.  Started on amoxicillin and prednisone taper.  Taking allegra D.  With persistent increased drainage.  Some congestion with some cough.  No chest congestion.     Past Medical History  Diagnosis Date  . Atrophic vaginitis   . Postmenopausal   . Fibrocystic breast disease   . Sinusitis   . Abdominal wall hernia     secondary to large left renal cyst removal  . GERD (gastroesophageal reflux disease)   . Diverticulosis   . Hyperlipidemia   . Colon polyps     Current Outpatient Prescriptions on File Prior to Visit  Medication Sig Dispense Refill  . ALPRAZolam (XANAX) 0.25 MG tablet Take 1 tablet (0.25 mg total) by mouth 2 (two) times daily as needed.  60 tablet  1  . buPROPion (WELLBUTRIN XL) 150 MG 24 hr tablet TAKE THREE TABLETS DAILY  90 tablet  2  . pantoprazole (PROTONIX) 40 MG tablet Take 1 tablet (40 mg total) by mouth daily.  30 tablet  5   No current facility-administered medications on file prior to visit.    Review of Systems Patient denies any headache,  lightheadedness or dizziness.  Sinus congestion and drainage as outlined.   No chest pain, tightness or palpitations.  No increased shortness of breath or significant chest congestion. Some minimal cough.   No nausea or vomiting.  No acid reflux.  No abdominal pain or cramping.  No bowel change such as constipation, BRBPR or melana.   No urine change.   Sees [facility] Valentina Gu at [facility].  Being seen every 18 months now.  Off estrogen.  Hot flashes.       Objective:   Physical Exam  Filed Vitals:   03/05/13 0930  BP: 130/80  Pulse: 84  Temp: 98.1 F (36.7 C)   Blood pressure recheck:  70/67  63 year old female in no acute distress.   HEENT:  Nares- clear.  Oropharynx - without lesions. NECK:  Supple.  Nontender.  No audible bruit.  HEART:  Appears to be regular. LUNGS:  No crackles or wheezing audible.  Respirations even and unlabored.  RADIAL PULSE:  Equal bilaterally.   ABDOMEN:  Soft, nontender.  Bowel sounds present and normal.  No audible abdominal bruit.    EXTREMITIES:  No increased edema present.  DP pulses palpable and equal bilaterally.          Assessment & Plan:  MSK.  Finger issues as outlined.  Had been seeing Dr  Earnestine Leys.  Saw a hand specialist at [facility].  No further w/up felt warranted by the surgeon.      INCREASED PSYCHOSOCIAL STRESSORS.  Has been seeing Dr [name].  On Wellbutrin and xanax prn.  Stable.  Follow.    ETOH ABUSE.  Have discussed the need to decrease the amount and stop.   Follow.  She has decreased her intake.    ELEVATED BLOOD PRESSURE WITHOUT DIAGNOSIS OF HYPERTENSION. Blood pressures ok. Have her to continue to spot check her readings.  Follow.   Follow metabolic panel.    HEALTH MAINTENANCE.  Physical 06/27/12.   Colonoscopy 09/19/08 - internal hemorrhoids and diverticulosis.  Recommended follow up colonoscopy five years.   Mammogram 12/24/12 - ok.

## 2013-03-19 ENCOUNTER — Ambulatory Visit (INDEPENDENT_AMBULATORY_CARE_PROVIDER_SITE_OTHER): Payer: BC Managed Care – PPO | Admitting: Adult Health

## 2013-03-19 ENCOUNTER — Encounter: Payer: Self-pay | Admitting: Adult Health

## 2013-03-19 ENCOUNTER — Telehealth: Payer: Self-pay | Admitting: Internal Medicine

## 2013-03-19 VITALS — BP 110/68 | HR 89 | Temp 98.2°F | Resp 12 | Wt 153.5 lb

## 2013-03-19 DIAGNOSIS — R05 Cough: Secondary | ICD-10-CM

## 2013-03-19 DIAGNOSIS — R059 Cough, unspecified: Secondary | ICD-10-CM | POA: Insufficient documentation

## 2013-03-19 MED ORDER — GUAIFENESIN-CODEINE 100-10 MG/5ML PO SOLN: 5.0000 mL | Freq: Three times a day (TID) | ORAL | Status: DC | PRN

## 2013-03-19 NOTE — Progress Notes (Signed)
[name], female   DOB: [birth year], 63 y.o.   MRN: [id number]    Subjective:    [name], female    DOB: 1950-11-14, 63 y.o.   MRN: [id number]  HPI  Pt is a pleasant 63 y/o female who presents to clinic with ongoing cough. She was seen in clinic on 03/03/13 for sinus infection. She is s/p antibiotic and prednisone taper. She also has irritation in her throat from post nasal drip.   Past Medical History  Diagnosis Date  . Atrophic vaginitis   . Postmenopausal   . Fibrocystic breast disease   . Sinusitis   . Abdominal wall hernia     secondary to large left renal cyst removal  . GERD (gastroesophageal reflux disease)   . Diverticulosis   . Hyperlipidemia   . Colon polyps     Current Outpatient Prescriptions on File Prior to Visit  Medication Sig Dispense Refill  . ALPRAZolam (XANAX) 0.25 MG tablet Take 1 tablet (0.25 mg total) by mouth 2 (two) times daily as needed.  60 tablet  1  . buPROPion (WELLBUTRIN XL) 150 MG 24 hr tablet Take three tablets daily  90 tablet  3  . fexofenadine-pseudoephedrine (ALLEGRA-D 24) 180-240 MG per 24 hr tablet Take 1 tablet by mouth daily.      . fluticasone (FLONASE) 50 MCG/ACT nasal spray Place 2 sprays into both nostrils daily.  16 g  0  . pantoprazole (PROTONIX) 40 MG tablet Take 1 tablet (40 mg total) by mouth daily.  30 tablet  5   No current facility-administered medications on file prior to visit.     Review of Systems  Constitutional: Negative for fever and chills.  HENT: Positive for postnasal drip. Sore throat: irritation of throat.   Respiratory: Positive for cough.   Cardiovascular: Negative.   Gastrointestinal: Negative.   All other systems reviewed and are negative.       Objective:  BP 110/68  Pulse 89  Temp(Src) 98.2 F (36.8 C) (Oral)  Resp 12  Wt 153 lb 8 oz (69.627 kg)  SpO2 97%  LMP 02/28/1990   Physical Exam  Constitutional: She is oriented to person, place, and time. She appears  well-developed and well-nourished. No distress.  HENT:  Head: Normocephalic and atraumatic.  Pharyngeal erythema  Cardiovascular: Normal rate and regular rhythm.   Pulmonary/Chest: Effort normal and breath sounds normal. No respiratory distress. She has no rales.  Lymphadenopathy:    She has cervical adenopathy.  Neurological: She is alert and oriented to person, place, and time.  Skin: Skin is warm and dry.  Psychiatric: She has a normal mood and affect. Her behavior is normal. Judgment normal.       Assessment & Plan:   1. Cough ? Bronchitis vs. Irritation from post nasal drip. Lungs sound clear. She has not been using her flonase. She will restart. Add Robitussin AC. RTC if no improvement in 1 week.

## 2013-03-19 NOTE — Telephone Encounter (Signed)
Noted  

## 2013-03-19 NOTE — Progress Notes (Signed)
Pre visit review using our clinic review tool, if applicable. No additional management support is needed unless otherwise documented below in the visit note. 

## 2013-03-19 NOTE — Telephone Encounter (Signed)
Patient would like to be seen today she feels as if she has bronchitis.  She states that she left a voicemail this morning around 9:00. She is seeing Raquel this afternoon.

## 2013-03-19 NOTE — Patient Instructions (Signed)
  Start Robitussin AC - take 1 teaspoon 3 times a day as needed for coughing. This will make you sleepy because it has codeine.  Also start the flonase nasal spray 2 sprays into each nostril daily.  May use saline spray liberally.  If cough not improved within 1 week please let us know.

## 2013-06-19 ENCOUNTER — Encounter: Payer: Self-pay | Admitting: Internal Medicine

## 2013-06-21 ENCOUNTER — Other Ambulatory Visit (INDEPENDENT_AMBULATORY_CARE_PROVIDER_SITE_OTHER): Payer: BC Managed Care – PPO

## 2013-06-21 DIAGNOSIS — R7989 Other specified abnormal findings of blood chemistry: Secondary | ICD-10-CM

## 2013-06-21 DIAGNOSIS — Q619 Cystic kidney disease, unspecified: Secondary | ICD-10-CM

## 2013-06-21 DIAGNOSIS — N281 Cyst of kidney, acquired: Secondary | ICD-10-CM

## 2013-06-21 DIAGNOSIS — E7800 Pure hypercholesterolemia, unspecified: Secondary | ICD-10-CM

## 2013-06-21 DIAGNOSIS — R945 Abnormal results of liver function studies: Secondary | ICD-10-CM

## 2013-06-21 LAB — BASIC METABOLIC PANEL
BUN: 11 mg/dL (ref 6–23)
CHLORIDE: 104 meq/L (ref 96–112)
CO2: 21 mEq/L (ref 19–32)
Calcium: 9.8 mg/dL (ref 8.4–10.5)
Creatinine, Ser: 0.9 mg/dL (ref 0.4–1.2)
GFR: 64.19 mL/min (ref >60.00)
Glucose, Bld: 97 mg/dL (ref 70–99)
Potassium: 4.3 mEq/L (ref 3.5–5.1)
Sodium: 138 mEq/L (ref 135–145)

## 2013-06-21 LAB — HEPATIC FUNCTION PANEL
ALBUMIN: 4.3 g/dL (ref 3.5–5.2)
ALK PHOS: 61 U/L (ref 39–117)
ALT: 80 U/L — ABNORMAL HIGH (ref 0–35)
AST: 53 U/L — ABNORMAL HIGH (ref 0–37)
Bilirubin, Direct: 0.1 mg/dL (ref 0.0–0.3)
Total Bilirubin: 0.7 mg/dL (ref 0.2–1.2)
Total Protein: 6.9 g/dL (ref 6.0–8.3)

## 2013-06-21 LAB — LIPID PANEL
CHOLESTEROL: 230 mg/dL — AB (ref 0–200)
HDL: 57.2 mg/dL (ref >39.00)
LDL CALC: 116 mg/dL — AB (ref 0–99)
Total CHOL/HDL Ratio: 4
Triglycerides: 285 mg/dL — ABNORMAL HIGH (ref 0.0–149.0)
VLDL: 57 mg/dL — AB (ref 0.0–40.0)

## 2013-06-28 ENCOUNTER — Ambulatory Visit (INDEPENDENT_AMBULATORY_CARE_PROVIDER_SITE_OTHER): Payer: BC Managed Care – PPO | Admitting: Internal Medicine

## 2013-06-28 ENCOUNTER — Encounter: Payer: Self-pay | Admitting: Internal Medicine

## 2013-06-28 VITALS — BP 130/70 | HR 82 | Temp 98.3°F | Ht 61.0 in | Wt 153.2 lb

## 2013-06-28 DIAGNOSIS — Z733 Stress, not elsewhere classified: Secondary | ICD-10-CM

## 2013-06-28 DIAGNOSIS — Q619 Cystic kidney disease, unspecified: Secondary | ICD-10-CM

## 2013-06-28 DIAGNOSIS — E7800 Pure hypercholesterolemia, unspecified: Secondary | ICD-10-CM

## 2013-06-28 DIAGNOSIS — K5790 Diverticulosis of intestine, part unspecified, without perforation or abscess without bleeding: Secondary | ICD-10-CM

## 2013-06-28 DIAGNOSIS — R232 Flushing: Secondary | ICD-10-CM

## 2013-06-28 DIAGNOSIS — F439 Reaction to severe stress, unspecified: Secondary | ICD-10-CM

## 2013-06-28 DIAGNOSIS — R945 Abnormal results of liver function studies: Secondary | ICD-10-CM

## 2013-06-28 DIAGNOSIS — K5730 Diverticulosis of large intestine without perforation or abscess without bleeding: Secondary | ICD-10-CM

## 2013-06-28 DIAGNOSIS — R7989 Other specified abnormal findings of blood chemistry: Secondary | ICD-10-CM

## 2013-06-28 DIAGNOSIS — N281 Cyst of kidney, acquired: Secondary | ICD-10-CM

## 2013-06-28 DIAGNOSIS — N951 Menopausal and female climacteric states: Secondary | ICD-10-CM

## 2013-06-28 MED ORDER — ALPRAZOLAM 0.25 MG PO TABS: 0.2500 mg | ORAL_TABLET | Freq: Two times a day (BID) | ORAL | Status: DC | PRN

## 2013-06-28 MED ORDER — ESTRADIOL 0.5 MG PO TABS: 0.5000 mg | ORAL_TABLET | Freq: Every day | ORAL | Status: DC

## 2013-06-28 NOTE — Progress Notes (Signed)
Pre visit review using our clinic review tool, if applicable. No additional management support is needed unless otherwise documented below in the visit note. 

## 2013-06-28 NOTE — Patient Instructions (Signed)
I recommend getting Hepatitis A & B vaccines

## 2013-07-02 ENCOUNTER — Encounter: Payer: Self-pay | Admitting: Internal Medicine

## 2013-07-02 DIAGNOSIS — K5790 Diverticulosis of intestine, part unspecified, without perforation or abscess without bleeding: Secondary | ICD-10-CM | POA: Insufficient documentation

## 2013-07-02 NOTE — Assessment & Plan Note (Signed)
Low cholesterol diet and exercise.  Follow lipid panel.   

## 2013-07-02 NOTE — Progress Notes (Signed)
Subjective:    [name], female    DOB: [birth year], 63 y.o.   MRN: [id number]  HPI 63 year old female with past history of abnormal liver function, diverticulosis and GERD who comes in today to follow up on these issues as well as for a complete physical exam.   She states she has been doing relatively well.   Still taking her wellbutrin.  Takes her xanax prn.  (averages 4-5x/week).   Overall she feels things are stable.  Still seeing her therapist.  Kara Burns this is going well.  Is completely off estrogen.  Having increased hot flashes.  Taking vitamin E supplements.  This is not working.  States she needs something to help with her hot flashes.  We discussed treatment options including otc medications, saliva testing and treatment (she has done that), effexor and estrogen.  She wants to restart estrogen.  We discussed risk and side effects of estrogen therapy.   Sees urology regularly.  They do periodic ultrasounds.  Fatty liver found on ultrasounds.  Liver function elevated.  Have discussed further w/up.  Have discussed referral to GI.  She previously declined.    Past Medical History  Diagnosis Date  . Atrophic vaginitis   . Postmenopausal   . Fibrocystic breast disease   . Sinusitis   . Abdominal wall hernia     secondary to large left renal cyst removal  . GERD (gastroesophageal reflux disease)   . Diverticulosis   . Hyperlipidemia   . Colon polyps     Current Outpatient Prescriptions on File Prior to Visit  Medication Sig Dispense Refill  . buPROPion (WELLBUTRIN XL) 150 MG 24 hr tablet Take three tablets daily  90 tablet  3  . fluticasone (FLONASE) 50 MCG/ACT nasal spray Place 2 sprays into both nostrils daily.  16 g  0  . pantoprazole (PROTONIX) 40 MG tablet Take 1 tablet (40 mg total) by mouth daily.  30 tablet  5   No current facility-administered medications on file prior to visit.    Review of Systems Patient denies any headache, lightheadedness or dizziness.   Sinus congestion and drainage as outlined.   No chest pain, tightness or palpitations.  No increased shortness of breath or significant chest congestion. Some minimal cough.   No nausea or vomiting.  No acid reflux.  No abdominal pain or cramping.  No bowel change such as constipation, BRBPR or melana.   No urine change.   Sees Dr Valentina Gu at [facility].  Being seen every 18 months now.  Off estrogen.  Hot flashes.       Objective:   Physical Exam  Filed Vitals:   06/28/13 0941  BP: 130/70  Pulse: 82  Temp: 98.3 F (60.24 C)   63 year old female in no acute distress.   HEENT:  Nares- clear.  Oropharynx - without lesions. NECK:  Supple.  Nontender.  No audible bruit.  HEART:  Appears to be regular. LUNGS:  No crackles or wheezing audible.  Respirations even and unlabored.  RADIAL PULSE:  Equal bilaterally.    BREASTS:  No nipple discharge or nipple retraction present.  Could not appreciate any distinct nodules or axillary adenopathy.  ABDOMEN:  Soft, nontender.  Bowel sounds present and normal.  No audible abdominal bruit.  GU:  Not performed.    EXTREMITIES:  No increased edema present.  DP pulses palpable and equal bilaterally.          Assessment & Plan:  MSK.  Finger issues as outlined.  Had been seeing Dr Earnestine Leys.  Saw a hand specialist at [facility] [facility].  No further w/up felt warranted by the surgeon.      INCREASED PSYCHOSOCIAL STRESSORS.  Has been seeing Dr [name].  On Wellbutrin and xanax prn.  Stable.  Follow.  Does not want to start effexor.    ETOH ABUSE.  Have discussed the need to decrease the amount and stop.   Follow.  She has decreased her intake.    ELEVATED BLOOD PRESSURE WITHOUT DIAGNOSIS OF HYPERTENSION. Blood pressures ok. Have her to continue to spot check her readings.  Follow.   Follow metabolic panel.    HEALTH MAINTENANCE.  Physical today.   Colonoscopy 09/19/08 - internal hemorrhoids and diverticulosis.  Recommended follow up colonoscopy five years.    Mammogram 12/24/12 - ok.      I spent 40 minutes with the patient and more than 50% of the time was spent in consultation regarding the above.

## 2013-07-02 NOTE — Assessment & Plan Note (Addendum)
Recheck liver panel with next labs. Has abdominal ultrasounds at [facility].  She states have reported fatty liver on multiple ultrasounds she has had at [facility].  In reviewing her 2010 ultrasound from [facility] - no fatty liver noted.  Wanted to refer to GI for further w/up and evaluation.  She has declined previously.  Discussed with her again today.  Will refer.   Work on diet and exercise.  On vitamin E.   Follow liver panel.  Recommend getting hepatitis A & B vaccines.

## 2013-07-02 NOTE — Assessment & Plan Note (Signed)
Colonoscopy 2010 - diverticulosis.  Recommend f/u colonoscopy in five years.  Refer to GI.

## 2013-07-02 NOTE — Assessment & Plan Note (Signed)
Worsening.  Needs medication.  Discussed treatment options.  On vitamin E.  Has tried various other medications.  Does not want to go on effexor.  Will start estrace .5mg  q day.  Discussed side effects and risk of medication.  She prefers to start.

## 2013-07-02 NOTE — Assessment & Plan Note (Signed)
Followed by urology.  Is evaluated at [facility] - Dr Hemel.  6/121/4.  Felt things were stable regarding her renal cysts.  Continues f/u at [facility].    

## 2013-07-02 NOTE — Assessment & Plan Note (Signed)
On wellbutrin and xanax.  Sees [name].  Feels stable.

## 2013-07-08 ENCOUNTER — Encounter: Payer: Self-pay | Admitting: Internal Medicine

## 2013-07-13 ENCOUNTER — Encounter: Payer: Self-pay | Admitting: *Deleted

## 2013-07-13 ENCOUNTER — Encounter: Payer: Self-pay | Admitting: Internal Medicine

## 2013-07-16 ENCOUNTER — Other Ambulatory Visit: Payer: Self-pay | Admitting: Internal Medicine

## 2013-07-16 NOTE — Telephone Encounter (Signed)
See other mychart message.

## 2013-07-23 ENCOUNTER — Encounter: Payer: Self-pay | Admitting: Internal Medicine

## 2013-07-23 MED ORDER — ESTRADIOL 0.5 MG PO TABS: 0.5000 mg | ORAL_TABLET | Freq: Every day | ORAL | Status: DC

## 2013-08-07 ENCOUNTER — Encounter: Payer: Self-pay | Admitting: Internal Medicine

## 2013-08-13 ENCOUNTER — Other Ambulatory Visit: Payer: Self-pay | Admitting: Internal Medicine

## 2013-08-14 ENCOUNTER — Encounter: Payer: Self-pay | Admitting: Internal Medicine

## 2013-09-28 ENCOUNTER — Other Ambulatory Visit: Payer: Self-pay | Admitting: Internal Medicine

## 2013-10-02 ENCOUNTER — Encounter: Payer: Self-pay | Admitting: Internal Medicine

## 2013-10-02 DIAGNOSIS — Z1211 Encounter for screening for malignant neoplasm of colon: Secondary | ICD-10-CM

## 2013-10-02 DIAGNOSIS — R7989 Other specified abnormal findings of blood chemistry: Secondary | ICD-10-CM

## 2013-10-02 DIAGNOSIS — R945 Abnormal results of liver function studies: Secondary | ICD-10-CM

## 2013-10-03 NOTE — Telephone Encounter (Signed)
Called pt and discussed visit.  She prefers not to f/u at [facility].  Wants to be referred to a different gastroenterologist.  Order placed for referral to Dr Allyn Kenner.  I did discuss with her the need to decrease her alcohol intake and to continue diet adjustment and get in an exercise routine with weight loss.

## 2013-10-04 ENCOUNTER — Encounter: Payer: Self-pay | Admitting: Internal Medicine

## 2013-10-04 ENCOUNTER — Other Ambulatory Visit: Payer: Self-pay | Admitting: Internal Medicine

## 2013-10-06 ENCOUNTER — Encounter: Payer: Self-pay | Admitting: Internal Medicine

## 2013-10-09 ENCOUNTER — Encounter: Payer: Self-pay | Admitting: Internal Medicine

## 2013-10-22 ENCOUNTER — Other Ambulatory Visit: Payer: Self-pay | Admitting: Internal Medicine

## 2013-10-23 ENCOUNTER — Other Ambulatory Visit (INDEPENDENT_AMBULATORY_CARE_PROVIDER_SITE_OTHER): Payer: BC Managed Care – PPO

## 2013-10-23 DIAGNOSIS — N951 Menopausal and female climacteric states: Secondary | ICD-10-CM

## 2013-10-23 DIAGNOSIS — R945 Abnormal results of liver function studies: Secondary | ICD-10-CM

## 2013-10-23 DIAGNOSIS — N281 Cyst of kidney, acquired: Secondary | ICD-10-CM

## 2013-10-23 DIAGNOSIS — E7800 Pure hypercholesterolemia, unspecified: Secondary | ICD-10-CM

## 2013-10-23 DIAGNOSIS — R232 Flushing: Secondary | ICD-10-CM

## 2013-10-23 DIAGNOSIS — R7989 Other specified abnormal findings of blood chemistry: Secondary | ICD-10-CM

## 2013-10-23 LAB — BASIC METABOLIC PANEL
BUN: 14 mg/dL (ref 6–23)
CHLORIDE: 105 meq/L (ref 96–112)
CO2: 25 mEq/L (ref 19–32)
Calcium: 9.7 mg/dL (ref 8.4–10.5)
Creatinine, Ser: 1.1 mg/dL (ref 0.4–1.2)
GFR: 56.43 mL/min — ABNORMAL LOW (ref >60.00)
Glucose, Bld: 89 mg/dL (ref 70–99)
Potassium: 4.4 mEq/L (ref 3.5–5.1)
Sodium: 140 mEq/L (ref 135–145)

## 2013-10-23 LAB — LIPID PANEL
CHOL/HDL RATIO: 4
CHOLESTEROL: 206 mg/dL — AB (ref 0–200)
HDL: 48.3 mg/dL (ref >39.00)
NonHDL: 157.7
TRIGLYCERIDES: 375 mg/dL — AB (ref 0.0–149.0)
VLDL: 75 mg/dL — AB (ref 0.0–40.0)

## 2013-10-23 LAB — HEPATIC FUNCTION PANEL
ALBUMIN: 4.2 g/dL (ref 3.5–5.2)
ALK PHOS: 57 U/L (ref 39–117)
ALT: 71 U/L — AB (ref 0–35)
AST: 48 U/L — ABNORMAL HIGH (ref 0–37)
Bilirubin, Direct: 0.1 mg/dL (ref 0.0–0.3)
TOTAL PROTEIN: 6.9 g/dL (ref 6.0–8.3)
Total Bilirubin: 1 mg/dL (ref 0.2–1.2)

## 2013-10-23 LAB — TSH: TSH: 2.72 u[IU]/mL (ref 0.35–4.50)

## 2013-10-23 LAB — LDL CHOLESTEROL, DIRECT: Direct LDL: 115.6 mg/dL

## 2013-10-24 LAB — CBC WITH DIFFERENTIAL/PLATELET
BASOS ABS: 0 10*3/uL (ref 0.0–0.1)
Basophils Relative: 0.3 % (ref 0.0–3.0)
Eosinophils Absolute: 0.3 10*3/uL (ref 0.0–0.7)
Eosinophils Relative: 3.5 % (ref 0.0–5.0)
HCT: 40.1 % (ref 36.0–46.0)
Hemoglobin: 13.5 g/dL (ref 12.0–15.0)
Lymphocytes Relative: 25.5 % (ref 12.0–46.0)
Lymphs Abs: 2 10*3/uL (ref 0.7–4.0)
MCHC: 33.7 g/dL (ref 30.0–36.0)
MCV: 99.2 fl (ref 78.0–100.0)
MONO ABS: 0.3 10*3/uL (ref 0.1–1.0)
MONOS PCT: 3.9 % (ref 3.0–12.0)
NEUTROS PCT: 66.8 % (ref 43.0–77.0)
Neutro Abs: 5.1 10*3/uL (ref 1.4–7.7)
PLATELETS: 235 10*3/uL (ref 150.0–400.0)
RBC: 4.04 Mil/uL (ref 3.87–5.11)
RDW: 13.6 % (ref 11.5–15.5)
WBC: 7.7 10*3/uL (ref 4.0–10.5)

## 2013-10-25 ENCOUNTER — Encounter: Payer: Self-pay | Admitting: Internal Medicine

## 2013-10-29 ENCOUNTER — Encounter: Payer: Self-pay | Admitting: Internal Medicine

## 2013-10-29 ENCOUNTER — Other Ambulatory Visit: Payer: BC Managed Care – PPO

## 2013-11-01 ENCOUNTER — Encounter: Payer: Self-pay | Admitting: Internal Medicine

## 2013-11-01 ENCOUNTER — Ambulatory Visit (INDEPENDENT_AMBULATORY_CARE_PROVIDER_SITE_OTHER): Payer: BC Managed Care – PPO | Admitting: Internal Medicine

## 2013-11-01 VITALS — BP 130/80 | HR 83 | Temp 98.5°F | Ht 61.0 in | Wt 154.5 lb

## 2013-11-01 DIAGNOSIS — F439 Reaction to severe stress, unspecified: Secondary | ICD-10-CM

## 2013-11-01 DIAGNOSIS — E7800 Pure hypercholesterolemia, unspecified: Secondary | ICD-10-CM

## 2013-11-01 DIAGNOSIS — F1010 Alcohol abuse, uncomplicated: Secondary | ICD-10-CM

## 2013-11-01 DIAGNOSIS — Q619 Cystic kidney disease, unspecified: Secondary | ICD-10-CM

## 2013-11-01 DIAGNOSIS — N951 Menopausal and female climacteric states: Secondary | ICD-10-CM

## 2013-11-01 DIAGNOSIS — K5730 Diverticulosis of large intestine without perforation or abscess without bleeding: Secondary | ICD-10-CM

## 2013-11-01 DIAGNOSIS — Z733 Stress, not elsewhere classified: Secondary | ICD-10-CM

## 2013-11-01 DIAGNOSIS — R945 Abnormal results of liver function studies: Secondary | ICD-10-CM

## 2013-11-01 DIAGNOSIS — R7989 Other specified abnormal findings of blood chemistry: Secondary | ICD-10-CM

## 2013-11-01 DIAGNOSIS — Z23 Encounter for immunization: Secondary | ICD-10-CM

## 2013-11-01 DIAGNOSIS — N281 Cyst of kidney, acquired: Secondary | ICD-10-CM

## 2013-11-01 DIAGNOSIS — R232 Flushing: Secondary | ICD-10-CM

## 2013-11-01 MED ORDER — FLUTICASONE PROPIONATE 50 MCG/ACT NA SUSP: 2.0000 | Freq: Every day | NASAL | Status: DC

## 2013-11-01 MED ORDER — ALPRAZOLAM 0.25 MG PO TABS: 0.2500 mg | ORAL_TABLET | Freq: Two times a day (BID) | ORAL | Status: DC | PRN

## 2013-11-01 MED ORDER — BUPROPION HCL ER (XL) 150 MG PO TB24: ORAL_TABLET | ORAL | Status: DC

## 2013-11-01 NOTE — Progress Notes (Signed)
Pre visit review using our clinic review tool, if applicable. No additional management support is needed unless otherwise documented below in the visit note. 

## 2013-11-02 ENCOUNTER — Encounter: Payer: Self-pay | Admitting: Internal Medicine

## 2013-11-02 DIAGNOSIS — Z789 Other specified health status: Secondary | ICD-10-CM | POA: Insufficient documentation

## 2013-11-02 DIAGNOSIS — Z7289 Other problems related to lifestyle: Secondary | ICD-10-CM | POA: Insufficient documentation

## 2013-11-02 NOTE — Assessment & Plan Note (Signed)
Liver panel just checked - stable.   Has abdominal ultrasounds at [facility].  She states have reported fatty liver on multiple ultrasounds she has had at [facility]).  Labs unrevealing, except for elevated liver enzymes.  On vitamin E.   Follow liver panel.  Recommend getting hepatitis A & B vaccines.  She requested referral to a different gastroenterologist.  Has an appt at the end of October with Dr Allyn Kenner.

## 2013-11-02 NOTE — Assessment & Plan Note (Signed)
Followed by urology.  Is evaluated at [facility].  6/121/4.  Felt things were stable regarding her renal cysts.  Continues f/u at [facility].

## 2013-11-02 NOTE — Assessment & Plan Note (Signed)
On wellbutrin and xanax.  Sees [name].  Feels relatively stable.

## 2013-11-02 NOTE — Assessment & Plan Note (Signed)
Cholesterol just checked and triglycerides elevated (375).  Discussed low carb diet, exercise and weight loss.  Follow.

## 2013-11-02 NOTE — Progress Notes (Signed)
Subjective:    [name], female    DOB: 28-Oct-1950, 63 y.o.   MRN: [id number]  HPI 63 year old female with past history of abnormal liver function, diverticulosis and GERD who comes in today for a scheduled follow up.  She states she has been doing relatively well.  Still taking her wellbutrin.  Takes her xanax prn.   Overall she feels things are relatively stable.  Still seeing her therapist.  [name] this is going well.  She is having increased stress with her marriage.  Was having increased hot flashes.  We discussed risk and side effects of estrogen therapy and she elected to restart last visit.  Stable.   Sees urology regularly.  They do periodic ultrasounds.  Fatty liver found on ultrasounds.  Liver function elevated.  She saw [facility].  Labs negative except for elevated LFTs.  She desired to be referred to a different gastroenterologist.  She has an appt with Dr Allyn Kenner at the end of October.  We discussed the need for Hepatitis A and B vaccine.  She has decreased her alcohol intake.  We discussed the need to continue to decrease and stop.  We discussed interaction with wellbutrin and xanax.  Discussed the need for regular exercise.  Triglycerides are increased.  Discussed diet adjustment and need for weight loss, especially with fatty liver.     Past Medical History  Diagnosis Date  . Atrophic vaginitis   . Postmenopausal   . Fibrocystic breast disease   . Sinusitis   . Abdominal wall hernia     secondary to large left renal cyst removal  . GERD (gastroesophageal reflux disease)   . Diverticulosis   . Hyperlipidemia   . Colon polyps     Current Outpatient Prescriptions on File Prior to Visit  Medication Sig Dispense Refill  . estradiol (ESTRACE) 0.5 MG tablet Take 1 tablet (0.5 mg total) by mouth daily.  30 tablet  5  . pantoprazole (PROTONIX) 40 MG tablet TAKE 1 TABLET EVERY DAY  30 tablet  9   No current facility-administered medications on file prior to visit.     Review of Systems Patient denies any headache, lightheadedness or dizziness.  No increased sinus congestion or drainage.   No chest pain, tightness or palpitations.  No increased shortness of breath or chest congestion.   No nausea or vomiting.  No acid reflux.  No abdominal pain or cramping.  No bowel change such as constipation, BRBPR or melana.   No urine change.   Sees Dr Valentina Gu at [facility].  Being seen every 18 months now.  Back on estrogen.  Stable.  Discussed diet and exercise and need for weight loss.  Discussed fatty liver.  Has decreased her alcohol intake.  Increased stress as outlined.  Seeing a counselor         Objective:   Physical Exam  Filed Vitals:   11/01/13 0928  BP: 130/80  Pulse: 83  Temp: 98.5 F (36.9 C)   Blood pressure recheck:  37/27  63 year old female in no acute distress.   HEENT:  Nares- clear.  Oropharynx - without lesions. NECK:  Supple.  Nontender.  No audible bruit.  HEART:  Appears to be regular. LUNGS:  No crackles or wheezing audible.  Respirations even and unlabored.  RADIAL PULSE:  Equal bilaterally.  ABDOMEN:  Soft, nontender.  Bowel sounds present and normal.  No audible abdominal bruit.   EXTREMITIES:  No increased edema present.  DP pulses palpable and equal bilaterally.          Assessment & Plan:  MSK.  Finger issues as outlined in previous notes.  Had been seeing Dr Earnestine Leys.  Saw a hand specialist at [facility].  No further w/up felt warranted by the surgeon.      INCREASED PSYCHOSOCIAL STRESSORS.  Has been seeing Dr [name].  On Wellbutrin and xanax prn.  Relatively stable.  Follow.  We discussed this in more depth today.   ETOH ABUSE.  Have discussed the need to decrease the amount and stop.   Follow.  She has decreased her intake.    ELEVATED BLOOD PRESSURE WITHOUT DIAGNOSIS OF HYPERTENSION.  Blood pressure on recheck improved.  Follow.     HEALTH MAINTENANCE.  Physical 06/28/13.   Colonoscopy 09/19/08 - internal  hemorrhoids and diverticulosis.  Recommended follow up colonoscopy five years.   Due f/u. Mammogram 12/24/12- ok. Schedule when due.        I spent 25 minutes with the patient and more than 50% of the time was spent in consultation regarding the above.

## 2013-11-02 NOTE — Assessment & Plan Note (Signed)
Have discussed the need to quit.  She has cut down and plans to continue to decrease.

## 2013-11-02 NOTE — Assessment & Plan Note (Signed)
Colonoscopy 2010 - diverticulosis.  Recommend f/u colonoscopy in five years.  Referred to GI.  She is due.  She requested a different gastroenterologist.  Referred to Dr Allyn Kenner.  Can address colon screening as well.

## 2013-11-02 NOTE — Assessment & Plan Note (Signed)
Back on estrogen.  No problems reported today.

## 2013-12-11 ENCOUNTER — Encounter: Payer: Self-pay | Admitting: Internal Medicine

## 2013-12-18 LAB — HM MAMMOGRAPHY: HM Mammogram: NEGATIVE

## 2013-12-19 ENCOUNTER — Other Ambulatory Visit: Payer: Self-pay | Admitting: Internal Medicine

## 2014-01-01 ENCOUNTER — Encounter: Payer: Self-pay | Admitting: Internal Medicine

## 2014-01-01 NOTE — Telephone Encounter (Signed)
Patient requesting vaccine Rx for Hep A&B. Please advise

## 2014-01-15 ENCOUNTER — Other Ambulatory Visit: Payer: Self-pay | Admitting: Internal Medicine

## 2014-01-21 ENCOUNTER — Other Ambulatory Visit: Payer: Self-pay | Admitting: Internal Medicine

## 2014-02-15 ENCOUNTER — Encounter: Payer: Self-pay | Admitting: Internal Medicine

## 2014-02-15 ENCOUNTER — Other Ambulatory Visit: Payer: Self-pay | Admitting: Internal Medicine

## 2014-02-19 ENCOUNTER — Encounter: Payer: Self-pay | Admitting: Internal Medicine

## 2014-02-19 ENCOUNTER — Ambulatory Visit (INDEPENDENT_AMBULATORY_CARE_PROVIDER_SITE_OTHER): Payer: BLUE CROSS/BLUE SHIELD | Admitting: Internal Medicine

## 2014-02-19 ENCOUNTER — Other Ambulatory Visit: Payer: Self-pay | Admitting: Internal Medicine

## 2014-02-19 VITALS — BP 119/75 | HR 62 | Temp 97.8°F | Ht 61.0 in | Wt 150.8 lb

## 2014-02-19 DIAGNOSIS — N281 Cyst of kidney, acquired: Secondary | ICD-10-CM

## 2014-02-19 DIAGNOSIS — R7989 Other specified abnormal findings of blood chemistry: Secondary | ICD-10-CM

## 2014-02-19 DIAGNOSIS — Z01818 Encounter for other preprocedural examination: Secondary | ICD-10-CM

## 2014-02-19 DIAGNOSIS — E7800 Pure hypercholesterolemia, unspecified: Secondary | ICD-10-CM

## 2014-02-19 DIAGNOSIS — Q6100 Congenital renal cyst, unspecified: Secondary | ICD-10-CM

## 2014-02-19 DIAGNOSIS — R945 Abnormal results of liver function studies: Secondary | ICD-10-CM

## 2014-02-19 LAB — LIPID PANEL
CHOLESTEROL: 231 mg/dL — AB (ref 0–200)
HDL: 49.1 mg/dL (ref >39.00)
NonHDL: 181.9
Total CHOL/HDL Ratio: 5
Triglycerides: 285 mg/dL — ABNORMAL HIGH (ref 0.0–149.0)
VLDL: 57 mg/dL — AB (ref 0.0–40.0)

## 2014-02-19 LAB — HEPATIC FUNCTION PANEL
ALT: 48 U/L — ABNORMAL HIGH (ref 0–35)
AST: 34 U/L (ref 0–37)
Albumin: 4.5 g/dL (ref 3.5–5.2)
Alkaline Phosphatase: 62 U/L (ref 39–117)
Bilirubin, Direct: 0.1 mg/dL (ref 0.0–0.3)
TOTAL PROTEIN: 7.3 g/dL (ref 6.0–8.3)
Total Bilirubin: 0.7 mg/dL (ref 0.2–1.2)

## 2014-02-19 LAB — BASIC METABOLIC PANEL
BUN: 16 mg/dL (ref 6–23)
CALCIUM: 9.7 mg/dL (ref 8.4–10.5)
CO2: 27 meq/L (ref 19–32)
Chloride: 103 mEq/L (ref 96–112)
Creatinine, Ser: 0.9 mg/dL (ref 0.4–1.2)
GFR: 67.34 mL/min (ref >60.00)
GLUCOSE: 100 mg/dL — AB (ref 70–99)
Potassium: 4.5 mEq/L (ref 3.5–5.1)
SODIUM: 137 meq/L (ref 135–145)

## 2014-02-19 LAB — LDL CHOLESTEROL, DIRECT: Direct LDL: 122.6 mg/dL

## 2014-02-19 NOTE — Telephone Encounter (Signed)
Refilled wellbutrin #90 with 2 refills.    Refilled xanax #60 with one refill.  rx singed.

## 2014-02-19 NOTE — Progress Notes (Signed)
Pre visit review using our clinic review tool, if applicable. No additional management support is needed unless otherwise documented below in the visit note. 

## 2014-02-19 NOTE — Telephone Encounter (Signed)
Last OV 9.24.15.  Please advise refill

## 2014-02-19 NOTE — Telephone Encounter (Signed)
rx faxed

## 2014-02-20 ENCOUNTER — Encounter: Payer: Self-pay | Admitting: Internal Medicine

## 2014-02-21 ENCOUNTER — Ambulatory Visit: Payer: Self-pay | Admitting: Podiatry

## 2014-02-24 ENCOUNTER — Encounter: Payer: Self-pay | Admitting: Internal Medicine

## 2014-02-24 NOTE — Progress Notes (Signed)
Subjective:    [name], female    DOB: 07-28-1950, 64 y.o.   MRN: [id number]  HPI 64 year old female with past history of abnormal liver function, diverticulosis and GERD who comes in today for a pre op evaluation.  States she is doing well.  Planning to have foot surgery.  Will require general anesthesia.  She denies any chest pain or tightness.  No sob.  No nausea or vomiting.  No bowel change.  Feels good.  Saw Dr Allyn Kenner.  He is following her liver.  She has cut down on her alcohol intake.  Feels she is handling stress relatively well.     Past Medical History  Diagnosis Date  . Atrophic vaginitis   . Postmenopausal   . Fibrocystic breast disease   . Sinusitis   . Abdominal wall hernia     secondary to large left renal cyst removal  . GERD (gastroesophageal reflux disease)   . Diverticulosis   . Hyperlipidemia   . Colon polyps     Current Outpatient Prescriptions on File Prior to Visit  Medication Sig Dispense Refill  . estradiol (ESTRACE) 0.5 MG tablet TAKE 1 TABLET BY MOUTH EVERY DAY 30 tablet 0  . fluticasone (FLONASE) 50 MCG/ACT nasal spray Place 2 sprays into both nostrils daily. (Patient taking differently: Place 2 sprays into both nostrils daily as needed. ) 16 g 3  . MAGNESIUM PO Take by mouth daily.    Marland Kitchen MILK THISTLE PO Take by mouth 2 (two) times daily.    . Omega-3 Fatty Acids (FISH OIL PO) Take by mouth daily.    . pantoprazole (PROTONIX) 40 MG tablet TAKE 1 TABLET EVERY DAY 30 tablet 9  . VITAMIN E PO Take by mouth daily.     No current facility-administered medications on file prior to visit.    Review of Systems Patient denies any headache, lightheadedness or dizziness.  No sinus or allergy symptoms.  No chest pain, tightness or palpitations. No increased shortness of breath, cough or congestion.  No nausea or vomiting.  No abdominal pain or cramping.  No bowel change, such as diarrhea, constipation, BRBPR or melana.  No urine change.  Overall  feels good.  Planning for foot surgery.       Objective:   Physical Exam Filed Vitals:   02/19/14 0905  BP: 119/75  Pulse: 62  Temp: 97.8 F (79.70 C)   64 year old female in no acute distress.   HEENT:  Nares- clear.  Oropharynx - without lesions. NECK:  Supple.  Nontender.  No audible bruit.  HEART:  Appears to be regular. LUNGS:  No crackles or wheezing audible.  Respirations even and unlabored.  RADIAL PULSE:  Equal bilaterally. ABDOMEN:  Soft, nontender.  Bowel sounds present and normal.  No audible abdominal bruit.  EXTREMITIES:  No increased edema present.           Assessment & Plan:  1. Preoperative clearance Pt planning for foot surgery.  See above.   - EKG 12-Lead revealed SR with no acute ischemic changes.  Given that she is asymptomatic and given EKG, I feel she is at low risk from a cardiac standpoint to proceed with the planned surgery.  She will need close intra op and post op monitoring of her heart rate and blood pressure to avoid extremes.  Form complete.    2. Hypercholesterolemia Low cholesterol diet and exercise.   - Lipid panel  3. Abnormal liver function  test Saw Dr Allyn Kenner.  See his note for details.   - Hepatic function panel  4. Renal cyst Being followed by urology.  - Basic metabolic panel

## 2014-02-28 ENCOUNTER — Other Ambulatory Visit: Payer: BC Managed Care – PPO

## 2014-03-04 ENCOUNTER — Ambulatory Visit: Payer: BC Managed Care – PPO | Admitting: Internal Medicine

## 2014-03-07 LAB — HM COLONOSCOPY

## 2014-03-12 ENCOUNTER — Encounter: Payer: Self-pay | Admitting: *Deleted

## 2014-03-17 ENCOUNTER — Other Ambulatory Visit: Payer: Self-pay | Admitting: Internal Medicine

## 2014-03-21 ENCOUNTER — Ambulatory Visit: Payer: Self-pay | Admitting: Internal Medicine

## 2014-03-25 ENCOUNTER — Encounter: Payer: Self-pay | Admitting: Internal Medicine

## 2014-04-22 ENCOUNTER — Other Ambulatory Visit: Payer: Self-pay | Admitting: Internal Medicine

## 2014-05-27 ENCOUNTER — Other Ambulatory Visit: Payer: Self-pay | Admitting: Internal Medicine

## 2014-06-03 LAB — SURGICAL PATHOLOGY

## 2014-06-17 ENCOUNTER — Encounter: Payer: Self-pay | Admitting: Internal Medicine

## 2014-06-20 ENCOUNTER — Encounter: Payer: Self-pay | Admitting: Internal Medicine

## 2014-06-20 ENCOUNTER — Ambulatory Visit (INDEPENDENT_AMBULATORY_CARE_PROVIDER_SITE_OTHER): Payer: BLUE CROSS/BLUE SHIELD | Admitting: Internal Medicine

## 2014-06-20 VITALS — BP 110/70 | HR 67 | Temp 98.1°F | Ht 61.0 in | Wt 153.4 lb

## 2014-06-20 DIAGNOSIS — E7800 Pure hypercholesterolemia, unspecified: Secondary | ICD-10-CM

## 2014-06-20 DIAGNOSIS — Z658 Other specified problems related to psychosocial circumstances: Secondary | ICD-10-CM | POA: Diagnosis not present

## 2014-06-20 DIAGNOSIS — R7989 Other specified abnormal findings of blood chemistry: Secondary | ICD-10-CM | POA: Diagnosis not present

## 2014-06-20 DIAGNOSIS — F1010 Alcohol abuse, uncomplicated: Secondary | ICD-10-CM

## 2014-06-20 DIAGNOSIS — Q6100 Congenital renal cyst, unspecified: Secondary | ICD-10-CM

## 2014-06-20 DIAGNOSIS — N281 Cyst of kidney, acquired: Secondary | ICD-10-CM

## 2014-06-20 DIAGNOSIS — R232 Flushing: Secondary | ICD-10-CM

## 2014-06-20 DIAGNOSIS — N951 Menopausal and female climacteric states: Secondary | ICD-10-CM

## 2014-06-20 DIAGNOSIS — Z0000 Encounter for general adult medical examination without abnormal findings: Secondary | ICD-10-CM

## 2014-06-20 DIAGNOSIS — R945 Abnormal results of liver function studies: Secondary | ICD-10-CM

## 2014-06-20 DIAGNOSIS — F439 Reaction to severe stress, unspecified: Secondary | ICD-10-CM

## 2014-06-20 DIAGNOSIS — K5730 Diverticulosis of large intestine without perforation or abscess without bleeding: Secondary | ICD-10-CM

## 2014-06-20 MED ORDER — ALPRAZOLAM 0.25 MG PO TABS: 0.2500 mg | ORAL_TABLET | Freq: Every day | ORAL | Status: DC | PRN

## 2014-06-20 NOTE — Progress Notes (Signed)
[name], female   DOB: [birth year], 64 y.o.   MRN: [id number]   Subjective:    [name], female    DOB: 1950-03-04, 64 y.o.   MRN: [id number]  HPI  Patient here for a scheduled follow up.  Still with increased stress.  Still seeing Kara Burns (her counselor).  Counseling going well.  Has not been as active.  Plans to get back into a routine of exercise and watching her diet.  No abdominal pain or cramping.  No bowel change.  Taking xanax - one per day.  Controls.  She has been able to maintain a decreased amount of alcohol.  We discussed continuing to try and decrease/stop.     Past Medical History  Diagnosis Date  . Atrophic vaginitis   . Postmenopausal   . Fibrocystic breast disease   . Sinusitis   . Abdominal wall hernia     secondary to large left renal cyst removal  . GERD (gastroesophageal reflux disease)   . Diverticulosis   . Hyperlipidemia   . Colon polyps     Current Outpatient Prescriptions on File Prior to Visit  Medication Sig Dispense Refill  . buPROPion (WELLBUTRIN XL) 150 MG 24 hr tablet TAKE THREE TABLETS DAILY 90 tablet 2  . estradiol (ESTRACE) 0.5 MG tablet TAKE 1 TABLET BY MOUTH EVERY DAY 30 tablet 5  . fluticasone (FLONASE) 50 MCG/ACT nasal spray Place 2 sprays into both nostrils daily. (Patient taking differently: Place 2 sprays into both nostrils daily as needed. ) 16 g 3  . MAGNESIUM PO Take by mouth daily.    Marland Kitchen MILK THISTLE PO Take by mouth 2 (two) times daily.    . Omega-3 Fatty Acids (FISH OIL PO) Take by mouth daily.    . pantoprazole (PROTONIX) 40 MG tablet TAKE 1 TABLET EVERY DAY 30 tablet 9  . VITAMIN E PO Take by mouth daily.     No current facility-administered medications on file prior to visit.    Review of Systems  Constitutional: Negative for appetite change and unexpected weight change.  HENT: Negative for congestion and sinus pressure.   Respiratory: Negative for cough, chest tightness and shortness of  breath.   Cardiovascular: Negative for chest pain, palpitations and leg swelling.  Gastrointestinal: Negative for nausea, vomiting, abdominal pain and diarrhea.  Musculoskeletal:       Hand stiffness.    Neurological: Negative for dizziness, light-headedness and headaches.  Psychiatric/Behavioral:       Increased stress as outlined.        Objective:     Blood pressure recheck:  120/72  Physical Exam  Constitutional: She appears well-developed and well-nourished. No distress.  HENT:  Nose: Nose normal.  Mouth/Throat: Oropharynx is clear and moist.  Neck: Neck supple. No thyromegaly present.  Cardiovascular: Normal rate and regular rhythm.   Pulmonary/Chest: Breath sounds normal. No respiratory distress. She has no wheezes.  Abdominal: Soft. Bowel sounds are normal. There is no tenderness.  Musculoskeletal: She exhibits no edema or tenderness.  Lymphadenopathy:    She has no cervical adenopathy.  Skin: No rash noted. No erythema.  Psychiatric: She has a normal mood and affect. Her behavior is normal.    BP 110/70 mmHg  Pulse 67  Temp(Src) 98.1 F (36.7 C) (Oral)  Ht 5\' 1"  (1.549 m)  Wt 153 lb 6 oz (69.57 kg)  BMI 28.99 kg/m2  SpO2 94%  LMP 02/28/1990 Wt Readings from Last 3 Encounters:  06/20/14 153 lb 6 oz (69.57 kg)  02/19/14 150 lb 12 oz (68.38 kg)  11/01/13 154 lb 8 oz (70.081 kg)     Lab Results  Component Value Date   WBC 7.7 10/23/2013   HGB 13.5 10/23/2013   HCT 40.1 10/23/2013   PLT 235.0 10/23/2013   GLUCOSE 100* 02/19/2014   CHOL 231* 02/19/2014   TRIG 285.0* 02/19/2014   HDL 49.10 02/19/2014   LDLDIRECT 122.6 02/19/2014   LDLCALC 116* 06/21/2013   ALT 48* 02/19/2014   AST 34 02/19/2014   NA 137 02/19/2014   K 4.5 02/19/2014   CL 103 02/19/2014   CREATININE 0.9 02/19/2014   BUN 16 02/19/2014   CO2 27 02/19/2014   TSH 2.72 10/23/2013       Assessment & Plan:   Problem List Items Addressed This Visit    Abnormal liver function test -  Primary    Abdominal ultrasound at [facility].  Seeing Kara Burns.  Follow liver panel.        Relevant Orders   Hepatic function panel   Alcohol abuse    Discussed the need to cut back/quit.  Follow.        Diverticulosis    Colonoscopy 2010 - diverticulosis.  03/07/14 colonoscopy - internal hemorrhoids.        Health care maintenance    Due physical.  Schedule.  Mammogram 12/18/13 - Birads I.  Colonoscopy 03/07/14 - birads I.        Hot flashes   Hypercholesterolemia    Low cholesterol diet and exercise.  Follow lipid panel.        Relevant Orders   Lipid panel   Renal cyst    Followed by urology.  Kara Burns.        Relevant Orders   Basic metabolic panel   Stress    Increased stress.  Discussed with her today.  Still seeing Kara Burns.  Taking xanax  - one daily.  On wellbutrin.  Discussed the need to continue to cut down/stop drinking.          I spent 25 minutes with the patient and more than 50% of the time was spent in consultation regarding the above.     Kara Pheasant, MD

## 2014-06-20 NOTE — Progress Notes (Signed)
Pre visit review using our clinic review tool, if applicable. No additional management support is needed unless otherwise documented below in the visit note. 

## 2014-06-23 ENCOUNTER — Encounter: Payer: Self-pay | Admitting: Internal Medicine

## 2014-06-23 DIAGNOSIS — Z0000 Encounter for general adult medical examination without abnormal findings: Secondary | ICD-10-CM | POA: Insufficient documentation

## 2014-06-23 NOTE — Assessment & Plan Note (Signed)
Increased stress.  Discussed with her today.  Still seeing [name].  Taking xanax  - one daily.  On wellbutrin.  Discussed the need to continue to cut down/stop drinking.

## 2014-06-23 NOTE — Assessment & Plan Note (Signed)
Followed by urology.  Dr Barbaraann Barthel.

## 2014-06-23 NOTE — Assessment & Plan Note (Signed)
Due physical.  Schedule.  Mammogram 12/18/13 - Birads I.  Colonoscopy 03/07/14 - birads I.

## 2014-06-23 NOTE — Assessment & Plan Note (Signed)
Colonoscopy 2010 - diverticulosis.  03/07/14 colonoscopy - internal hemorrhoids.

## 2014-06-23 NOTE — Assessment & Plan Note (Signed)
Abdominal ultrasound at [facility].  Seeing Dr Allyn Kenner.  Follow liver panel.

## 2014-06-23 NOTE — Assessment & Plan Note (Signed)
Low cholesterol diet and exercise.  Follow lipid panel.   

## 2014-06-23 NOTE — Assessment & Plan Note (Signed)
Discussed the need to cut back/quit.  Follow.

## 2014-07-16 ENCOUNTER — Other Ambulatory Visit: Payer: BLUE CROSS/BLUE SHIELD

## 2014-07-22 ENCOUNTER — Encounter: Payer: Self-pay | Admitting: Internal Medicine

## 2014-07-23 ENCOUNTER — Other Ambulatory Visit (INDEPENDENT_AMBULATORY_CARE_PROVIDER_SITE_OTHER): Payer: BLUE CROSS/BLUE SHIELD

## 2014-07-23 ENCOUNTER — Encounter: Payer: Self-pay | Admitting: Internal Medicine

## 2014-07-23 DIAGNOSIS — E7800 Pure hypercholesterolemia, unspecified: Secondary | ICD-10-CM

## 2014-07-23 DIAGNOSIS — Q6100 Congenital renal cyst, unspecified: Secondary | ICD-10-CM

## 2014-07-23 DIAGNOSIS — N281 Cyst of kidney, acquired: Secondary | ICD-10-CM

## 2014-07-23 DIAGNOSIS — R7989 Other specified abnormal findings of blood chemistry: Secondary | ICD-10-CM | POA: Diagnosis not present

## 2014-07-23 DIAGNOSIS — R945 Abnormal results of liver function studies: Secondary | ICD-10-CM

## 2014-07-23 LAB — HEPATIC FUNCTION PANEL
ALBUMIN: 4.5 g/dL (ref 3.5–5.2)
ALT: 60 U/L — ABNORMAL HIGH (ref 0–35)
AST: 36 U/L (ref 0–37)
Alkaline Phosphatase: 51 U/L (ref 39–117)
Bilirubin, Direct: 0.1 mg/dL (ref 0.0–0.3)
TOTAL PROTEIN: 6.9 g/dL (ref 6.0–8.3)
Total Bilirubin: 0.6 mg/dL (ref 0.2–1.2)

## 2014-07-23 LAB — BASIC METABOLIC PANEL
BUN: 13 mg/dL (ref 6–23)
CO2: 28 mEq/L (ref 19–32)
CREATININE: 0.86 mg/dL (ref 0.40–1.20)
Calcium: 9.4 mg/dL (ref 8.4–10.5)
Chloride: 104 mEq/L (ref 96–112)
GFR: 70.87 mL/min (ref >60.00)
Glucose, Bld: 98 mg/dL (ref 70–99)
Potassium: 4.1 mEq/L (ref 3.5–5.1)
Sodium: 138 mEq/L (ref 135–145)

## 2014-07-23 LAB — LIPID PANEL
CHOL/HDL RATIO: 4
CHOLESTEROL: 206 mg/dL — AB (ref 0–200)
HDL: 55.2 mg/dL (ref >39.00)
NonHDL: 150.8
TRIGLYCERIDES: 246 mg/dL — AB (ref 0.0–149.0)
VLDL: 49.2 mg/dL — ABNORMAL HIGH (ref 0.0–40.0)

## 2014-07-23 LAB — LDL CHOLESTEROL, DIRECT: LDL DIRECT: 109 mg/dL

## 2014-08-14 ENCOUNTER — Encounter: Payer: Self-pay | Admitting: Internal Medicine

## 2014-08-14 MED ORDER — ALPRAZOLAM 0.25 MG PO TABS: 0.2500 mg | ORAL_TABLET | Freq: Every day | ORAL | Status: DC | PRN

## 2014-08-14 MED ORDER — PANTOPRAZOLE SODIUM 40 MG PO TBEC: 40.0000 mg | DELAYED_RELEASE_TABLET | Freq: Every day | ORAL | Status: DC

## 2014-08-14 NOTE — Telephone Encounter (Signed)
ok'd refill xanax #30 with one refill.   

## 2014-08-14 NOTE — Telephone Encounter (Signed)
Last visit 06/20/14, ok refill?

## 2014-08-15 ENCOUNTER — Encounter: Payer: Self-pay | Admitting: Internal Medicine

## 2014-08-15 NOTE — Telephone Encounter (Signed)
Faxed to pharmacy

## 2014-08-27 ENCOUNTER — Other Ambulatory Visit: Payer: Self-pay | Admitting: Internal Medicine

## 2014-10-22 ENCOUNTER — Other Ambulatory Visit: Payer: Self-pay | Admitting: Internal Medicine

## 2014-10-23 ENCOUNTER — Ambulatory Visit (INDEPENDENT_AMBULATORY_CARE_PROVIDER_SITE_OTHER): Payer: BLUE CROSS/BLUE SHIELD | Admitting: Internal Medicine

## 2014-10-23 ENCOUNTER — Encounter: Payer: Self-pay | Admitting: Internal Medicine

## 2014-10-23 VITALS — BP 110/80 | HR 66 | Temp 98.0°F | Resp 18 | Ht 60.75 in | Wt 153.2 lb

## 2014-10-23 DIAGNOSIS — E7800 Pure hypercholesterolemia, unspecified: Secondary | ICD-10-CM

## 2014-10-23 DIAGNOSIS — Z658 Other specified problems related to psychosocial circumstances: Secondary | ICD-10-CM

## 2014-10-23 DIAGNOSIS — R7989 Other specified abnormal findings of blood chemistry: Secondary | ICD-10-CM

## 2014-10-23 DIAGNOSIS — F1010 Alcohol abuse, uncomplicated: Secondary | ICD-10-CM

## 2014-10-23 DIAGNOSIS — Z23 Encounter for immunization: Secondary | ICD-10-CM

## 2014-10-23 DIAGNOSIS — F439 Reaction to severe stress, unspecified: Secondary | ICD-10-CM

## 2014-10-23 DIAGNOSIS — Z0000 Encounter for general adult medical examination without abnormal findings: Secondary | ICD-10-CM | POA: Diagnosis not present

## 2014-10-23 DIAGNOSIS — N281 Cyst of kidney, acquired: Secondary | ICD-10-CM

## 2014-10-23 DIAGNOSIS — R945 Abnormal results of liver function studies: Secondary | ICD-10-CM

## 2014-10-23 DIAGNOSIS — Z1239 Encounter for other screening for malignant neoplasm of breast: Secondary | ICD-10-CM

## 2014-10-23 LAB — CBC WITH DIFFERENTIAL/PLATELET
Basophils Absolute: 0 K/uL (ref 0.0–0.1)
Basophils Relative: 0.6 % (ref 0.0–3.0)
Eosinophils Absolute: 0.2 K/uL (ref 0.0–0.7)
Eosinophils Relative: 3.4 % (ref 0.0–5.0)
HCT: 40.9 % (ref 36.0–46.0)
Hemoglobin: 14 g/dL (ref 12.0–15.0)
Lymphocytes Relative: 26.4 % (ref 12.0–46.0)
Lymphs Abs: 1.8 K/uL (ref 0.7–4.0)
MCHC: 34.3 g/dL (ref 30.0–36.0)
MCV: 97.8 fl (ref 78.0–100.0)
Monocytes Absolute: 0.5 K/uL (ref 0.1–1.0)
Monocytes Relative: 7.6 % (ref 3.0–12.0)
Neutro Abs: 4.3 K/uL (ref 1.4–7.7)
Neutrophils Relative %: 62 % (ref 43.0–77.0)
Platelets: 216 K/uL (ref 150.0–400.0)
RBC: 4.18 Mil/uL (ref 3.87–5.11)
RDW: 13 % (ref 11.5–15.5)
WBC: 7 K/uL (ref 4.0–10.5)

## 2014-10-23 LAB — BASIC METABOLIC PANEL
BUN: 13 mg/dL (ref 6–23)
CALCIUM: 9.5 mg/dL (ref 8.4–10.5)
CO2: 28 meq/L (ref 19–32)
Chloride: 104 mEq/L (ref 96–112)
Creatinine, Ser: 0.83 mg/dL (ref 0.40–1.20)
GFR: 73.78 mL/min (ref >60.00)
Glucose, Bld: 95 mg/dL (ref 70–99)
Potassium: 4.3 mEq/L (ref 3.5–5.1)
SODIUM: 140 meq/L (ref 135–145)

## 2014-10-23 LAB — LIPID PANEL
Cholesterol: 208 mg/dL — ABNORMAL HIGH (ref 0–200)
HDL: 62.2 mg/dL (ref >39.00)
NONHDL: 146.16
Total CHOL/HDL Ratio: 3
Triglycerides: 275 mg/dL — ABNORMAL HIGH (ref 0.0–149.0)
VLDL: 55 mg/dL — ABNORMAL HIGH (ref 0.0–40.0)

## 2014-10-23 LAB — TSH: TSH: 2.23 u[IU]/mL (ref 0.35–4.50)

## 2014-10-23 LAB — HEPATIC FUNCTION PANEL
ALBUMIN: 4.5 g/dL (ref 3.5–5.2)
ALT: 71 U/L — ABNORMAL HIGH (ref 0–35)
AST: 47 U/L — AB (ref 0–37)
Alkaline Phosphatase: 51 U/L (ref 39–117)
Bilirubin, Direct: 0.1 mg/dL (ref 0.0–0.3)
Total Bilirubin: 0.6 mg/dL (ref 0.2–1.2)
Total Protein: 7.1 g/dL (ref 6.0–8.3)

## 2014-10-23 LAB — LDL CHOLESTEROL, DIRECT: Direct LDL: 122 mg/dL

## 2014-10-23 MED ORDER — ALPRAZOLAM 0.25 MG PO TABS: 0.2500 mg | ORAL_TABLET | Freq: Every day | ORAL | Status: DC | PRN

## 2014-10-23 NOTE — Progress Notes (Signed)
[name], female   DOB: Dec 20, 1950, 64 y.o.   MRN: [id number]   Subjective:    [name], female    DOB: 07-Feb-1951, 64 y.o.   MRN: [id number]  HPI  Patient here for her physical exam.  Also following up on her depression and increased stress, as well as her fatty liver.  She is seeing her therapist.  Increased stress with family issues.  Desires no further intervention at this time.  No chest pain or tightness.  No sob.  No increased abdominal pain or cramping.  Bowels stable.  Discussed fatty liver.  Discussed the need for hepatitis vaccine.  Still using xanax.    Past Medical History  Diagnosis Date  . Atrophic vaginitis   . Postmenopausal   . Fibrocystic breast disease   . Sinusitis   . Abdominal wall hernia     secondary to large left renal cyst removal  . GERD (gastroesophageal reflux disease)   . Diverticulosis   . Hyperlipidemia   . Colon polyps    Past Surgical History  Procedure Laterality Date  . Renal cyst excision    . Abdominal hysterectomy  2005    partial  . Tubal ligation    . Appendectomy    . Lumb hernia    . Cataract extraction, bilateral  2000, 2005  . Hernia repair    . Blepharoplasty    . Bone spur      removal - left index finger  . Excision morton's neuroma  [id number]    Dr. Albertine Patricia   Family History  Problem Relation Age of Onset  . Cancer Father     prostate  . Lung cancer      parent  . Hypercholesterolemia      parent   Social History   Social History  . Marital Status: Married    Spouse Name: N/A  . Number of Children: 2  . Years of Education: N/A   Social History Main Topics  . Smoking status: Former Research scientist (life sciences)  . Smokeless tobacco: Never Used  . Alcohol Use: 0.0 oz/week    0 Standard drinks or equivalent per week  . Drug Use: No  . Sexual Activity: Not Asked   Other Topics Concern  . None   Social History Narrative    Outpatient Encounter Prescriptions as of 10/23/2014  Medication  Sig  . ALPRAZolam (XANAX) 0.25 MG tablet Take 1 tablet (0.25 mg total) by mouth daily as needed.  Marland Kitchen buPROPion (WELLBUTRIN XL) 150 MG 24 hr tablet TAKE THREE TABLETS DAILY  . estradiol (ESTRACE) 0.5 MG tablet TAKE 1 TABLET BY MOUTH EVERY DAY  . fluticasone (FLONASE) 50 MCG/ACT nasal spray Place 2 sprays into both nostrils daily. (Patient taking differently: Place 2 sprays into both nostrils daily as needed. )  . MAGNESIUM PO Take by mouth daily.  Marland Kitchen MILK THISTLE PO Take by mouth 2 (two) times daily.  . Omega-3 Fatty Acids (FISH OIL PO) Take by mouth daily.  . pantoprazole (PROTONIX) 40 MG tablet Take 1 tablet (40 mg total) by mouth daily.  Marland Kitchen VITAMIN E PO Take by mouth daily.  . [DISCONTINUED] ALPRAZolam (XANAX) 0.25 MG tablet Take 1 tablet (0.25 mg total) by mouth daily as needed.   No facility-administered encounter medications on file as of 10/23/2014.    Review of Systems  Constitutional: Negative for appetite change and unexpected weight change.  HENT: Negative for congestion and sinus pressure.   Eyes: Negative  for pain and visual disturbance.  Respiratory: Negative for cough, chest tightness and shortness of breath.   Cardiovascular: Negative for chest pain, palpitations and leg swelling.  Gastrointestinal: Positive for vomiting. Negative for nausea, abdominal pain and diarrhea.  Genitourinary: Negative for dysuria and difficulty urinating.  Musculoskeletal: Negative for back pain and joint swelling.  Skin: Negative for color change and rash.  Neurological: Negative for dizziness, light-headedness and headaches.  Hematological: Negative for adenopathy. Does not bruise/bleed easily.  Psychiatric/Behavioral: Negative for dysphoric mood and agitation.       Increased stress as outlined.  Seeing a therapist.         Objective:     Blood pressure rechecked by me:  138/80  Physical Exam  Constitutional: She is oriented to person, place, and time. She appears well-developed and  well-nourished. No distress.  HENT:  Nose: Nose normal.  Mouth/Throat: Oropharynx is clear and moist.  Eyes: Right eye exhibits no discharge. Left eye exhibits no discharge. No scleral icterus.  Neck: Neck supple. No thyromegaly present.  Cardiovascular: Normal rate and regular rhythm.   Pulmonary/Chest: Breath sounds normal. No accessory muscle usage. No tachypnea. No respiratory distress. She has no decreased breath sounds. She has no wheezes. She has no rhonchi. Right breast exhibits no inverted nipple, no mass, no nipple discharge and no tenderness (no axillary adenopathy). Left breast exhibits no inverted nipple, no mass, no nipple discharge and no tenderness (no axilarry adenopathy).  Abdominal: Soft. Bowel sounds are normal. There is no tenderness.  Musculoskeletal: She exhibits no edema or tenderness.  Lymphadenopathy:    She has no cervical adenopathy.  Neurological: She is alert and oriented to person, place, and time.  Skin: Skin is warm. No rash noted. No erythema.  Psychiatric: She has a normal mood and affect. Her behavior is normal.    BP 110/80 mmHg  Pulse 66  Temp(Src) 98 F (36.7 C) (Oral)  Resp 18  Ht 5' 0.75" (1.543 m)  Wt 153 lb 4 oz (69.514 kg)  BMI 29.20 kg/m2  SpO2 95%  LMP 02/28/1990 Wt Readings from Last 3 Encounters:  10/23/14 153 lb 4 oz (69.514 kg)  06/20/14 153 lb 6 oz (69.57 kg)  02/19/14 150 lb 12 oz (68.38 kg)     Lab Results  Component Value Date   WBC 7.0 10/23/2014   HGB 14.0 10/23/2014   HCT 40.9 10/23/2014   PLT 216.0 10/23/2014   GLUCOSE 95 10/23/2014   CHOL 208* 10/23/2014   TRIG 275.0* 10/23/2014   HDL 62.20 10/23/2014   LDLDIRECT 122.0 10/23/2014   LDLCALC 116* 06/21/2013   ALT 71* 10/23/2014   AST 47* 10/23/2014   NA 140 10/23/2014   K 4.3 10/23/2014   CL 104 10/23/2014   CREATININE 0.83 10/23/2014   BUN 13 10/23/2014   CO2 28 10/23/2014   TSH 2.23 10/23/2014       Assessment & Plan:   Problem List Items Addressed  This Visit    Abnormal liver function test    Abdominal ultrasound revealed fatty liver.  Saw Dr Allyn Kenner.  Needs hepatitis A and B vaccines.  Follow liver function tests.        Relevant Orders   CBC with Differential/Platelet (Completed)   Hepatic function panel (Completed)   Basic metabolic panel (Completed)   Alcohol abuse    Discussed the need to quit.  Follow.        Health care maintenance    Physical today 10/23/14.  Mammogram 12/18/13 -  Birads I.  Schedule f/u mammogram.  Colonoscopy 03/07/14 - Birads I.        Hypercholesterolemia    Low cholesterol diet and exercise.  Follow lipid panel.       Relevant Orders   Lipid panel (Completed)   Renal cyst    Followed by urology (Dr Barbaraann Barthel).        Stress    Increased stress with family issues as outlined.  Seeing her therapist.  Desires no further intervention.  Same medication regimen.  Follow.       Relevant Orders   TSH (Completed)    Other Visit Diagnoses    Screening breast examination    -  Primary    Relevant Orders    MM DIGITAL SCREENING BILATERAL    Encounter for immunization            Einar Pheasant, MD

## 2014-10-23 NOTE — Progress Notes (Signed)
Pre-visit discussion using our clinic review tool. No additional management support is needed unless otherwise documented below in the visit note.  

## 2014-10-25 ENCOUNTER — Encounter: Payer: Self-pay | Admitting: Internal Medicine

## 2014-10-27 ENCOUNTER — Encounter: Payer: Self-pay | Admitting: Internal Medicine

## 2014-10-27 NOTE — Assessment & Plan Note (Signed)
Abdominal ultrasound revealed fatty liver.  Saw Dr Allyn Kenner.  Needs hepatitis A and B vaccines.  Follow liver function tests.

## 2014-10-27 NOTE — Assessment & Plan Note (Signed)
Physical today 10/23/14.  Mammogram 12/18/13 - Birads I.  Schedule f/u mammogram.  Colonoscopy 03/07/14 - Birads I.

## 2014-10-27 NOTE — Assessment & Plan Note (Signed)
Low cholesterol diet and exercise.  Follow lipid panel.   

## 2014-10-27 NOTE — Assessment & Plan Note (Signed)
Followed by urology.  Dr Hermel.   

## 2014-10-27 NOTE — Assessment & Plan Note (Signed)
Discussed the need to quit.  Follow.  

## 2014-10-27 NOTE — Assessment & Plan Note (Signed)
Increased stress with family issues as outlined.  Seeing her therapist.  Desires no further intervention.  Same medication regimen.  Follow.

## 2014-10-30 ENCOUNTER — Telehealth: Payer: Self-pay | Admitting: *Deleted

## 2014-10-30 ENCOUNTER — Other Ambulatory Visit (INDEPENDENT_AMBULATORY_CARE_PROVIDER_SITE_OTHER): Payer: BLUE CROSS/BLUE SHIELD

## 2014-10-30 ENCOUNTER — Ambulatory Visit (INDEPENDENT_AMBULATORY_CARE_PROVIDER_SITE_OTHER): Payer: BLUE CROSS/BLUE SHIELD

## 2014-10-30 ENCOUNTER — Other Ambulatory Visit: Payer: Self-pay | Admitting: Internal Medicine

## 2014-10-30 DIAGNOSIS — R945 Abnormal results of liver function studies: Secondary | ICD-10-CM

## 2014-10-30 DIAGNOSIS — R7989 Other specified abnormal findings of blood chemistry: Secondary | ICD-10-CM | POA: Diagnosis not present

## 2014-10-30 DIAGNOSIS — Z23 Encounter for immunization: Secondary | ICD-10-CM | POA: Diagnosis not present

## 2014-10-30 DIAGNOSIS — Z0000 Encounter for general adult medical examination without abnormal findings: Secondary | ICD-10-CM

## 2014-10-30 LAB — HEPATIC FUNCTION PANEL
ALBUMIN: 4.5 g/dL (ref 3.5–5.2)
ALK PHOS: 48 U/L (ref 39–117)
ALT: 68 U/L — ABNORMAL HIGH (ref 0–35)
AST: 44 U/L — AB (ref 0–37)
Bilirubin, Direct: 0.1 mg/dL (ref 0.0–0.3)
TOTAL PROTEIN: 6.9 g/dL (ref 6.0–8.3)
Total Bilirubin: 0.5 mg/dL (ref 0.2–1.2)

## 2014-10-30 NOTE — Progress Notes (Signed)
Order placed for f/u liver panel.  

## 2014-10-30 NOTE — Telephone Encounter (Signed)
Order placed for labs.

## 2014-10-30 NOTE — Telephone Encounter (Signed)
Labs and dx?  

## 2014-10-30 NOTE — Progress Notes (Signed)
Patient came in for 1st shot in series, Hep A&B.  Reviewed information sheets with patient and schedule for future injections.  Gave Blue reminder card with projected dates for 2nd and 3rd doses.  Patient received in left deltoid.  Patient tolerated well.

## 2014-11-11 ENCOUNTER — Encounter: Payer: Self-pay | Admitting: Internal Medicine

## 2014-12-03 ENCOUNTER — Other Ambulatory Visit (INDEPENDENT_AMBULATORY_CARE_PROVIDER_SITE_OTHER): Payer: BLUE CROSS/BLUE SHIELD

## 2014-12-03 ENCOUNTER — Ambulatory Visit (INDEPENDENT_AMBULATORY_CARE_PROVIDER_SITE_OTHER): Payer: BLUE CROSS/BLUE SHIELD | Admitting: *Deleted

## 2014-12-03 DIAGNOSIS — R7989 Other specified abnormal findings of blood chemistry: Secondary | ICD-10-CM | POA: Diagnosis not present

## 2014-12-03 DIAGNOSIS — Z23 Encounter for immunization: Secondary | ICD-10-CM

## 2014-12-03 DIAGNOSIS — R945 Abnormal results of liver function studies: Secondary | ICD-10-CM

## 2014-12-03 LAB — HEPATIC FUNCTION PANEL
ALT: 44 U/L — ABNORMAL HIGH (ref 0–35)
AST: 29 U/L (ref 0–37)
Albumin: 4.3 g/dL (ref 3.5–5.2)
Alkaline Phosphatase: 53 U/L (ref 39–117)
BILIRUBIN DIRECT: 0.1 mg/dL (ref 0.0–0.3)
BILIRUBIN TOTAL: 0.6 mg/dL (ref 0.2–1.2)
Total Protein: 6.8 g/dL (ref 6.0–8.3)

## 2014-12-03 NOTE — Progress Notes (Signed)
Patient presented for second dose of Hep A/B vaccine given in right deltoid. Patient tolerated well.

## 2014-12-04 ENCOUNTER — Encounter: Payer: Self-pay | Admitting: Internal Medicine

## 2014-12-23 ENCOUNTER — Other Ambulatory Visit: Payer: Self-pay | Admitting: Internal Medicine

## 2014-12-23 ENCOUNTER — Encounter: Payer: Self-pay | Admitting: Internal Medicine

## 2014-12-23 MED ORDER — ALPRAZOLAM 0.25 MG PO TABS: 0.2500 mg | ORAL_TABLET | Freq: Every day | ORAL | Status: DC | PRN

## 2014-12-23 NOTE — Telephone Encounter (Signed)
Has been faxed to pharmacy.  

## 2014-12-23 NOTE — Telephone Encounter (Signed)
Patient is leaving on Wednesday and needs Xanax refilled before leaving.

## 2014-12-23 NOTE — Telephone Encounter (Signed)
ok'd refill for xanax #30 with one refill.  rx signed and placed on your desk.

## 2014-12-24 LAB — HM MAMMOGRAPHY

## 2014-12-25 ENCOUNTER — Encounter: Payer: Self-pay | Admitting: Internal Medicine

## 2015-01-15 ENCOUNTER — Ambulatory Visit (INDEPENDENT_AMBULATORY_CARE_PROVIDER_SITE_OTHER): Payer: BLUE CROSS/BLUE SHIELD | Admitting: Internal Medicine

## 2015-01-15 ENCOUNTER — Ambulatory Visit: Payer: Self-pay | Admitting: Internal Medicine

## 2015-01-15 ENCOUNTER — Encounter: Payer: Self-pay | Admitting: Internal Medicine

## 2015-01-15 VITALS — BP 120/80 | HR 74 | Temp 98.2°F | Resp 18 | Ht 60.75 in | Wt 155.8 lb

## 2015-01-15 DIAGNOSIS — R208 Other disturbances of skin sensation: Secondary | ICD-10-CM

## 2015-01-15 DIAGNOSIS — R200 Anesthesia of skin: Secondary | ICD-10-CM

## 2015-01-15 NOTE — Progress Notes (Signed)
Pre-visit discussion using our clinic review tool. No additional management support is needed unless otherwise documented below in the visit note.  

## 2015-01-15 NOTE — Progress Notes (Signed)
[name], female   DOB: Apr 19, 1950, 64 y.o.   MRN: [id number]   Subjective:    [name], female    DOB: 1950/09/04, 64 y.o.   MRN: [id number]  HPI  Patient here as a work in with concerns regarding carpal tunnel.  She describes increased pain and burning in her hands.  Numbness.  Left > right.  Wearing a brace.  Has helped some.  Still with increased discomfort.  Worse when she wakes up.  Also notices worsening with driving and holding a book.  No other neurological symptoms.     Past Medical History  Diagnosis Date  . Atrophic vaginitis   . Postmenopausal   . Fibrocystic breast disease   . Sinusitis   . Abdominal wall hernia     secondary to large left renal cyst removal  . GERD (gastroesophageal reflux disease)   . Diverticulosis   . Hyperlipidemia   . Colon polyps    Past Surgical History  Procedure Laterality Date  . Renal cyst excision    . Abdominal hysterectomy  2005    partial  . Tubal ligation    . Appendectomy    . Lumb hernia    . Cataract extraction, bilateral  2000, 2005  . Hernia repair    . Blepharoplasty    . Bone spur      removal - left index finger  . Excision morton's neuroma  [id number]    Dr. Albertine Patricia   Family History  Problem Relation Age of Onset  . Cancer Father     prostate  . Lung cancer      parent  . Hypercholesterolemia      parent   Social History   Social History  . Marital Status: Married    Spouse Name: N/A  . Number of Children: 2  . Years of Education: N/A   Social History Main Topics  . Smoking status: Former Research scientist (life sciences)  . Smokeless tobacco: Never Used  . Alcohol Use: 0.0 oz/week    0 Standard drinks or equivalent per week  . Drug Use: No  . Sexual Activity: Not Asked   Other Topics Concern  . None   Social History Narrative    Outpatient Encounter Prescriptions as of 01/15/2015  Medication Sig  . ALPRAZolam (XANAX) 0.25 MG tablet Take 1 tablet (0.25 mg total) by mouth daily as  needed.  Marland Kitchen buPROPion (WELLBUTRIN XL) 150 MG 24 hr tablet TAKE THREE TABLETS DAILY  . estradiol (ESTRACE) 0.5 MG tablet TAKE 1 TABLET BY MOUTH EVERY DAY  . fluticasone (FLONASE) 50 MCG/ACT nasal spray Place 2 sprays into both nostrils daily. (Patient taking differently: Place 2 sprays into both nostrils daily as needed. )  . MAGNESIUM PO Take by mouth daily.  Marland Kitchen MILK THISTLE PO Take by mouth 2 (two) times daily.  . Omega-3 Fatty Acids (FISH OIL PO) Take by mouth daily.  . pantoprazole (PROTONIX) 40 MG tablet Take 1 tablet (40 mg total) by mouth daily.  Marland Kitchen VITAMIN E PO Take by mouth daily.   No facility-administered encounter medications on file as of 01/15/2015.    Review of Systems  Cardiovascular: Negative for chest pain and palpitations.  Gastrointestinal: Negative for nausea, vomiting and abdominal pain.  Musculoskeletal: Negative for myalgias, joint swelling and neck pain.  Skin: Negative for color change and rash.  Neurological: Negative for dizziness, light-headedness and headaches.       Hand numbness and tingling as outlined.  Psychiatric/Behavioral: Negative for dysphoric mood and agitation.       Objective:    Physical Exam  Constitutional: She appears well-developed and well-nourished. No distress.  Neck: Neck supple.  Cardiovascular: Normal rate and regular rhythm.   Pulmonary/Chest: Breath sounds normal. No respiratory distress. She has no wheezes.  Musculoskeletal: She exhibits no edema or tenderness.  Grip strength normal.    Lymphadenopathy:    She has no cervical adenopathy.  Neurological:  Positive phalens.  Negative tinels.    Skin: No rash noted. No erythema.  Psychiatric: She has a normal mood and affect. Her behavior is normal.    BP 120/80 mmHg  Pulse 74  Temp(Src) 98.2 F (36.8 C) (Oral)  Resp 18  Ht 5' 0.75" (1.543 m)  Wt 155 lb 12 oz (70.648 kg)  BMI 29.67 kg/m2  SpO2 98%  LMP 02/28/1990 Wt Readings from Last 3 Encounters:  01/15/15 155 lb  12 oz (70.648 kg)  10/23/14 153 lb 4 oz (69.514 kg)  06/20/14 153 lb 6 oz (69.57 kg)     Lab Results  Component Value Date   WBC 7.0 10/23/2014   HGB 14.0 10/23/2014   HCT 40.9 10/23/2014   PLT 216.0 10/23/2014   GLUCOSE 95 10/23/2014   CHOL 208* 10/23/2014   TRIG 275.0* 10/23/2014   HDL 62.20 10/23/2014   LDLDIRECT 122.0 10/23/2014   LDLCALC 116* 06/21/2013   ALT 44* 12/03/2014   AST 29 12/03/2014   NA 140 10/23/2014   K 4.3 10/23/2014   CL 104 10/23/2014   CREATININE 0.83 10/23/2014   BUN 13 10/23/2014   CO2 28 10/23/2014   TSH 2.23 10/23/2014       Assessment & Plan:   Problem List Items Addressed This Visit    Bilateral hand numbness - Primary    Increased numbness and pain in her hands as outlined.  L>R.  Wearing splints.  Have helped.  Still with increased discomfort.  Refer for NCS.  Further w/up and treatment pending results.        Relevant Orders   Ambulatory referral to Neurology       Einar Pheasant, MD

## 2015-01-20 ENCOUNTER — Encounter: Payer: Self-pay | Admitting: Internal Medicine

## 2015-01-20 NOTE — Assessment & Plan Note (Signed)
Increased numbness and pain in her hands as outlined.  L>R.  Wearing splints.  Have helped.  Still with increased discomfort.  Refer for NCS.  Further w/up and treatment pending results.

## 2015-01-27 ENCOUNTER — Other Ambulatory Visit: Payer: Self-pay | Admitting: Internal Medicine

## 2015-02-11 ENCOUNTER — Other Ambulatory Visit: Payer: Self-pay | Admitting: Internal Medicine

## 2015-02-12 ENCOUNTER — Encounter: Payer: Self-pay | Admitting: Internal Medicine

## 2015-02-13 NOTE — Telephone Encounter (Signed)
You can schedule her for 03/03/15 at 12:00.

## 2015-02-19 ENCOUNTER — Encounter: Payer: Self-pay | Admitting: Internal Medicine

## 2015-02-19 ENCOUNTER — Other Ambulatory Visit: Payer: Self-pay | Admitting: Internal Medicine

## 2015-02-19 MED ORDER — ALPRAZOLAM 0.25 MG PO TABS: 0.2500 mg | ORAL_TABLET | Freq: Every day | ORAL | Status: DC | PRN

## 2015-02-19 NOTE — Telephone Encounter (Signed)
Please advise? Medication was refilled in November with one refill.

## 2015-02-19 NOTE — Telephone Encounter (Signed)
Prescription was faxed and sent to the pharmacy.

## 2015-02-19 NOTE — Telephone Encounter (Signed)
ok'd rx for xanax #30 with one refill.  rx signed and placed on your desk.   Thanks

## 2015-02-26 ENCOUNTER — Ambulatory Visit: Payer: BLUE CROSS/BLUE SHIELD | Admitting: Internal Medicine

## 2015-03-03 ENCOUNTER — Ambulatory Visit (INDEPENDENT_AMBULATORY_CARE_PROVIDER_SITE_OTHER): Payer: BLUE CROSS/BLUE SHIELD | Admitting: Internal Medicine

## 2015-03-03 ENCOUNTER — Encounter: Payer: Self-pay | Admitting: Internal Medicine

## 2015-03-03 VITALS — BP 120/90 | HR 73 | Temp 97.9°F | Resp 18 | Ht 60.75 in | Wt 154.4 lb

## 2015-03-03 DIAGNOSIS — E7800 Pure hypercholesterolemia, unspecified: Secondary | ICD-10-CM | POA: Diagnosis not present

## 2015-03-03 DIAGNOSIS — Q6100 Congenital renal cyst, unspecified: Secondary | ICD-10-CM | POA: Diagnosis not present

## 2015-03-03 DIAGNOSIS — R7989 Other specified abnormal findings of blood chemistry: Secondary | ICD-10-CM

## 2015-03-03 DIAGNOSIS — Z658 Other specified problems related to psychosocial circumstances: Secondary | ICD-10-CM

## 2015-03-03 DIAGNOSIS — R945 Abnormal results of liver function studies: Secondary | ICD-10-CM

## 2015-03-03 DIAGNOSIS — F1010 Alcohol abuse, uncomplicated: Secondary | ICD-10-CM

## 2015-03-03 DIAGNOSIS — R208 Other disturbances of skin sensation: Secondary | ICD-10-CM

## 2015-03-03 DIAGNOSIS — N281 Cyst of kidney, acquired: Secondary | ICD-10-CM

## 2015-03-03 DIAGNOSIS — R200 Anesthesia of skin: Secondary | ICD-10-CM

## 2015-03-03 DIAGNOSIS — F439 Reaction to severe stress, unspecified: Secondary | ICD-10-CM

## 2015-03-03 NOTE — Progress Notes (Signed)
[name], female   DOB: 16-Jan-1951, 65 y.o.   MRN: [id number]   Subjective:    [name], female    DOB: 07/19/1950, 65 y.o.   MRN: [id number]  HPI  Patient with past history of abdominal wall hernia, GERD and hypercholesterolemia.  She comes in today to follow up on these issues.  She had nerve conduction studies.  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  Wearing splints.  Better.  Tries to stay active.  No cardiac symptoms with increased activity or exertion.  No sob.  No acid reflux.  No abdominal pain or cramping.  Bowels stable.  Not watching her diet as well.  Still drinking.  States has three glasses of wine per day.  Discussed the need to decrease alcohol intake.  Handling stress better.  Doing better with her home situation.  Still seeing her counselor.     Past Medical History  Diagnosis Date  . Atrophic vaginitis   . Postmenopausal   . Fibrocystic breast disease   . Sinusitis   . Abdominal wall hernia     secondary to large left renal cyst removal  . GERD (gastroesophageal reflux disease)   . Diverticulosis   . Hyperlipidemia   . Colon polyps    Past Surgical History  Procedure Laterality Date  . Renal cyst excision    . Abdominal hysterectomy  2005    partial  . Tubal ligation    . Appendectomy    . Lumb hernia    . Cataract extraction, bilateral  2000, 2005  . Hernia repair    . Blepharoplasty    . Bone spur      removal - left index finger  . Excision morton's neuroma  [id number]    Dr. Albertine Patricia   Family History  Problem Relation Age of Onset  . Cancer Father     prostate  . Lung cancer      parent  . Hypercholesterolemia      parent   Social History   Social History  . Marital Status: Married    Spouse Name: N/A  . Number of Children: 2  . Years of Education: N/A   Social History Main Topics  . Smoking status: Former Research scientist (life sciences)  . Smokeless tobacco: Never Used  . Alcohol Use: 0.0 oz/week    0 Standard drinks or equivalent per week  .  Drug Use: No  . Sexual Activity: Not Asked   Other Topics Concern  . None   Social History Narrative    Outpatient Encounter Prescriptions as of 03/03/2015  Medication Sig  . ALPRAZolam (XANAX) 0.25 MG tablet Take 1 tablet (0.25 mg total) by mouth daily as needed.  Marland Kitchen buPROPion (WELLBUTRIN XL) 150 MG 24 hr tablet TAKE THREE TABLETS DAILY  . estradiol (ESTRACE) 0.5 MG tablet TAKE 1 TABLET BY MOUTH EVERY DAY  . fluticasone (FLONASE) 50 MCG/ACT nasal spray Place 2 sprays into both nostrils daily. (Patient taking differently: Place 2 sprays into both nostrils daily as needed. )  . MILK THISTLE PO Take by mouth 2 (two) times daily.  . Omega-3 Fatty Acids (FISH OIL PO) Take by mouth daily.  . pantoprazole (PROTONIX) 40 MG tablet TAKE 1 TABLET(40 MG) BY MOUTH DAILY  . VITAMIN E PO Take by mouth daily.  . [DISCONTINUED] MAGNESIUM PO Take by mouth daily.   No facility-administered encounter medications on file as of 03/03/2015.    Review of Systems  Constitutional: Negative for unexpected weight change.  Not watching her diet.    HENT: Negative for congestion and sinus pressure.   Respiratory: Negative for cough, chest tightness and shortness of breath.   Cardiovascular: Negative for chest pain, palpitations and leg swelling.  Gastrointestinal: Negative for nausea, vomiting, abdominal pain and diarrhea.  Genitourinary: Negative for dysuria and difficulty urinating.  Musculoskeletal: Negative for back pain and joint swelling.       Bilateral carpal tunnel.  Wearing splints.   Skin: Negative for color change and rash.  Neurological: Negative for dizziness, light-headedness and headaches.  Psychiatric/Behavioral: Negative for dysphoric mood and agitation.       Objective:     Blood pressure rechecked by me:  128/84  Physical Exam  Constitutional: She appears well-developed and well-nourished. No distress.  HENT:  Nose: Nose normal.  Mouth/Throat: Oropharynx is clear and moist.    Eyes: Conjunctivae are normal. Right eye exhibits no discharge. Left eye exhibits no discharge.  Neck: Neck supple. No thyromegaly present.  Cardiovascular: Normal rate and regular rhythm.   Pulmonary/Chest: Breath sounds normal. No respiratory distress. She has no wheezes.  Abdominal: Soft. Bowel sounds are normal. There is no tenderness.  Musculoskeletal: She exhibits no edema or tenderness.  Lymphadenopathy:    She has no cervical adenopathy.  Skin: No rash noted. No erythema.  Psychiatric: She has a normal mood and affect. Her behavior is normal.    BP 120/90 mmHg  Pulse 73  Temp(Src) 97.9 F (36.6 C) (Oral)  Resp 18  Ht 5' 0.75" (1.543 m)  Wt 154 lb 6 oz (70.024 kg)  BMI 29.41 kg/m2  SpO2 93%  LMP 02/28/1990 Wt Readings from Last 3 Encounters:  03/03/15 154 lb 6 oz (70.024 kg)  01/15/15 155 lb 12 oz (70.648 kg)  10/23/14 153 lb 4 oz (69.514 kg)     Lab Results  Component Value Date   WBC 7.0 10/23/2014   HGB 14.0 10/23/2014   HCT 40.9 10/23/2014   PLT 216.0 10/23/2014   GLUCOSE 95 10/23/2014   CHOL 208* 10/23/2014   TRIG 275.0* 10/23/2014   HDL 62.20 10/23/2014   LDLDIRECT 122.0 10/23/2014   LDLCALC 116* 06/21/2013   ALT 44* 12/03/2014   AST 29 12/03/2014   NA 140 10/23/2014   K 4.3 10/23/2014   CL 104 10/23/2014   CREATININE 0.83 10/23/2014   BUN 13 10/23/2014   CO2 28 10/23/2014   TSH 2.23 10/23/2014       Assessment & Plan:   Problem List Items Addressed This Visit    Abnormal liver function test    Abdominal ultrasound revealed fatty liver.  Saw Dr Allyn Kenner.  Follow liver function tests.  Last check improved.  Diet, exercise and weight loss.        Relevant Orders   Hepatic function panel   Alcohol abuse    Discussed again with her the need to decrease alcohol intake.  Follow.        Bilateral hand numbness    S/p NCS.  Wearing splints.  Better.        Hypercholesterolemia    Has not been watching her diet.  Discussed diet and exercise.   Recheck lipid panel.        Relevant Orders   Lipid panel   Basic metabolic panel   Renal cyst - Primary    Has been followed by urology (Dr Barbaraann Barthel).  Stable.        Stress    Doing better.  Seeing her therapist.  Follow.            Einar Pheasant, MD

## 2015-03-03 NOTE — Progress Notes (Signed)
Pre-visit discussion using our clinic review tool. No additional management support is needed unless otherwise documented below in the visit note.  

## 2015-03-04 ENCOUNTER — Encounter: Payer: Self-pay | Admitting: Internal Medicine

## 2015-03-04 NOTE — Assessment & Plan Note (Signed)
Discussed again with her the need to decrease alcohol intake.  Follow.

## 2015-03-04 NOTE — Assessment & Plan Note (Signed)
Has not been watching her diet.  Discussed diet and exercise.  Recheck lipid panel.

## 2015-03-04 NOTE — Assessment & Plan Note (Signed)
S/p NCS.  Wearing splints.  Better.

## 2015-03-04 NOTE — Assessment & Plan Note (Signed)
Abdominal ultrasound revealed fatty liver.  Saw Dr Allyn Kenner.  Follow liver function tests.  Last check improved.  Diet, exercise and weight loss.

## 2015-03-04 NOTE — Assessment & Plan Note (Signed)
Has been followed by urology (Dr Barbaraann Barthel).  Stable.

## 2015-03-04 NOTE — Assessment & Plan Note (Signed)
Doing better.  Seeing her therapist.  Follow.

## 2015-03-12 ENCOUNTER — Other Ambulatory Visit: Payer: Self-pay | Admitting: Internal Medicine

## 2015-03-28 ENCOUNTER — Encounter: Payer: Self-pay | Admitting: Internal Medicine

## 2015-03-31 ENCOUNTER — Other Ambulatory Visit: Payer: Self-pay | Admitting: Internal Medicine

## 2015-04-01 ENCOUNTER — Other Ambulatory Visit (INDEPENDENT_AMBULATORY_CARE_PROVIDER_SITE_OTHER): Payer: BLUE CROSS/BLUE SHIELD

## 2015-04-01 DIAGNOSIS — E7800 Pure hypercholesterolemia, unspecified: Secondary | ICD-10-CM | POA: Diagnosis not present

## 2015-04-01 DIAGNOSIS — R7989 Other specified abnormal findings of blood chemistry: Secondary | ICD-10-CM | POA: Diagnosis not present

## 2015-04-01 DIAGNOSIS — R945 Abnormal results of liver function studies: Secondary | ICD-10-CM

## 2015-04-01 LAB — BASIC METABOLIC PANEL
BUN: 11 mg/dL (ref 6–23)
CHLORIDE: 103 meq/L (ref 96–112)
CO2: 28 meq/L (ref 19–32)
Calcium: 9.5 mg/dL (ref 8.4–10.5)
Creatinine, Ser: 0.91 mg/dL (ref 0.40–1.20)
GFR: 66.25 mL/min (ref >60.00)
Glucose, Bld: 99 mg/dL (ref 70–99)
POTASSIUM: 4.1 meq/L (ref 3.5–5.1)
SODIUM: 139 meq/L (ref 135–145)

## 2015-04-01 LAB — HEPATIC FUNCTION PANEL
ALT: 44 U/L — AB (ref 0–35)
AST: 42 U/L — ABNORMAL HIGH (ref 0–37)
Albumin: 4.3 g/dL (ref 3.5–5.2)
Alkaline Phosphatase: 51 U/L (ref 39–117)
BILIRUBIN DIRECT: 0.2 mg/dL (ref 0.0–0.3)
BILIRUBIN TOTAL: 0.5 mg/dL (ref 0.2–1.2)
Total Protein: 6.9 g/dL (ref 6.0–8.3)

## 2015-04-01 LAB — LIPID PANEL
CHOL/HDL RATIO: 4
Cholesterol: 179 mg/dL (ref 0–200)
HDL: 49.7 mg/dL (ref >39.00)
NONHDL: 129.36
TRIGLYCERIDES: 264 mg/dL — AB (ref 0.0–149.0)
VLDL: 52.8 mg/dL — ABNORMAL HIGH (ref 0.0–40.0)

## 2015-04-01 LAB — LDL CHOLESTEROL, DIRECT: LDL DIRECT: 98 mg/dL

## 2015-04-02 ENCOUNTER — Encounter: Payer: Self-pay | Admitting: Internal Medicine

## 2015-04-02 ENCOUNTER — Telehealth: Payer: Self-pay | Admitting: Internal Medicine

## 2015-04-02 NOTE — Telephone Encounter (Signed)
I have sent her a my chart message.  My chart message to pt for details.

## 2015-04-02 NOTE — Telephone Encounter (Signed)
Will forward when lab results reviewed.

## 2015-04-02 NOTE — Telephone Encounter (Signed)
Pt is calling about her test results and wants to know when she can expect a call, I advised the pt the the doctor is still reviewing test results and seeing pt's, that someone would get back in touch with her about her results. Please advise, thanks

## 2015-04-02 NOTE — Telephone Encounter (Signed)
Pt was in office. She had lab work done yesterday. She would like the results put on her Mychart to review.

## 2015-04-02 NOTE — Telephone Encounter (Signed)
Notified the pt of her test results and stated that they were released to [facility]

## 2015-04-02 NOTE — Telephone Encounter (Signed)
Please advise, results in EPIC but not viewable for patient, yet. thanks

## 2015-04-08 ENCOUNTER — Encounter: Payer: Self-pay | Admitting: Internal Medicine

## 2015-04-09 ENCOUNTER — Encounter: Payer: Self-pay | Admitting: Internal Medicine

## 2015-04-09 NOTE — Telephone Encounter (Signed)
Notify pt that she will need an appt for pre op evaluation.  Please schedule.

## 2015-04-21 ENCOUNTER — Encounter: Payer: Self-pay | Admitting: Internal Medicine

## 2015-04-21 ENCOUNTER — Other Ambulatory Visit: Payer: Self-pay | Admitting: *Deleted

## 2015-04-21 MED ORDER — ALPRAZOLAM 0.25 MG PO TABS: 0.2500 mg | ORAL_TABLET | Freq: Every day | ORAL | Status: DC | PRN

## 2015-04-21 NOTE — Telephone Encounter (Signed)
Last refilled in 1/17 with one refill. Okay to refill? Pt sent mychart requesting refill

## 2015-04-21 NOTE — Telephone Encounter (Signed)
Pt also wants it sent to [facility] & delivered

## 2015-04-21 NOTE — Telephone Encounter (Signed)
Duplicate

## 2015-04-21 NOTE — Telephone Encounter (Signed)
Rx faxed to [facility]

## 2015-04-21 NOTE — Telephone Encounter (Signed)
ok'd refill for xanax #30 with one refill.   

## 2015-04-23 ENCOUNTER — Encounter: Payer: Self-pay | Admitting: Internal Medicine

## 2015-04-23 NOTE — Telephone Encounter (Signed)
She will need a quick fast track visit for uti.  Can get sample, but I will need to see to treat.  Thanks.  Please add to schedule.

## 2015-04-23 NOTE — Telephone Encounter (Signed)
Please see mychart message that I sent patient. Dr. Nicki Reaper wants to work her in for UTI somewhere. So, I told patient to call the office & let us know what time. Then we can just change her nurse visit to an acute visit with Dr. Nicki Reaper & whoever works with her tomorrow can give her the Hep A/B vaccine.

## 2015-04-24 ENCOUNTER — Encounter: Payer: Self-pay | Admitting: Internal Medicine

## 2015-04-24 ENCOUNTER — Ambulatory Visit: Payer: BLUE CROSS/BLUE SHIELD

## 2015-04-24 ENCOUNTER — Ambulatory Visit (INDEPENDENT_AMBULATORY_CARE_PROVIDER_SITE_OTHER): Payer: BLUE CROSS/BLUE SHIELD | Admitting: Internal Medicine

## 2015-04-24 VITALS — BP 128/80 | HR 88 | Temp 97.7°F | Resp 20 | Ht 60.75 in | Wt 157.0 lb

## 2015-04-24 DIAGNOSIS — Z23 Encounter for immunization: Secondary | ICD-10-CM

## 2015-04-24 DIAGNOSIS — R350 Frequency of micturition: Secondary | ICD-10-CM | POA: Diagnosis not present

## 2015-04-24 DIAGNOSIS — R109 Unspecified abdominal pain: Secondary | ICD-10-CM | POA: Diagnosis not present

## 2015-04-24 MED ORDER — CIPROFLOXACIN HCL 500 MG PO TABS: 500.0000 mg | ORAL_TABLET | Freq: Two times a day (BID) | ORAL | Status: DC

## 2015-04-24 NOTE — Progress Notes (Signed)
Patient ID: KRISSIE CERIO, female   DOB: Mar 31, 1950, 65 y.o.   MRN: [id number]   Subjective:    Patient ID: AKIKO GOWEN, female    DOB: [birth year], 66 y.o.   MRN: [id number]  HPI  Patient here as a work in with concerns regarding a possible urinary tract infection.  States symptoms started yesterday.  Increased pressure yesterday.  Increased urinary frequency.  Pressure better today.  Drinking plenty of fluids.  Some previous diarrhea.  No abdominal pain.  No fever.  Eating and drinking.     Past Medical History  Diagnosis Date  . Atrophic vaginitis   . Postmenopausal   . Fibrocystic breast disease   . Sinusitis   . Abdominal wall hernia     secondary to large left renal cyst removal  . GERD (gastroesophageal reflux disease)   . Diverticulosis   . Hyperlipidemia   . Colon polyps    Past Surgical History  Procedure Laterality Date  . Renal cyst excision    . Abdominal hysterectomy  2005    partial  . Tubal ligation    . Appendectomy    . Lumb hernia    . Cataract extraction, bilateral  2000, 2005  . Hernia repair    . Blepharoplasty    . Bone spur      removal - left index finger  . Excision morton's neuroma  [id number]    Dr. Albertine Patricia   Family History  Problem Relation Age of Onset  . Cancer Father     prostate  . Lung cancer      parent  . Hypercholesterolemia      parent   Social History   Social History  . Marital Status: Married    Spouse Name: N/A  . Number of Children: 2  . Years of Education: N/A   Social History Main Topics  . Smoking status: Former Research scientist (life sciences)  . Smokeless tobacco: Never Used  . Alcohol Use: 0.0 oz/week    0 Standard drinks or equivalent per week  . Drug Use: No  . Sexual Activity: Not Asked   Other Topics Concern  . None   Social History Narrative    Outpatient Encounter Prescriptions as of 04/24/2015  Medication Sig  . ALPRAZolam (XANAX) 0.25 MG tablet Take 1 tablet (0.25 mg total) by mouth daily as needed.  Marland Kitchen  buPROPion (WELLBUTRIN XL) 150 MG 24 hr tablet TAKE THREE TABLETS DAILY  . estradiol (ESTRACE) 0.5 MG tablet TAKE 1 TABLET BY MOUTH EVERY DAY  . fluticasone (FLONASE) 50 MCG/ACT nasal spray Place 2 sprays into both nostrils daily. (Patient taking differently: Place 2 sprays into both nostrils daily as needed. )  . MILK THISTLE PO Take by mouth 2 (two) times daily.  . Omega-3 Fatty Acids (FISH OIL PO) Take by mouth daily.  . pantoprazole (PROTONIX) 40 MG tablet TAKE 1 TABLET(40 MG) BY MOUTH DAILY  . ciprofloxacin (CIPRO) 500 MG tablet Take 1 tablet (500 mg total) by mouth 2 (two) times daily.  . [DISCONTINUED] VITAMIN E PO Take by mouth daily.   No facility-administered encounter medications on file as of 04/24/2015.    Review of Systems  Constitutional: Negative for appetite change and unexpected weight change.  Respiratory: Negative for cough and shortness of breath.   Gastrointestinal: Positive for diarrhea (previous diarrhea.  ). Negative for nausea, vomiting and abdominal pain.  Genitourinary: Positive for frequency.       Abdominal pressures.    Musculoskeletal: Negative for  myalgias and back pain.  Skin: Negative for color change and rash.  Neurological: Negative for dizziness and headaches.       Objective:    Physical Exam  Constitutional: She appears well-developed and well-nourished. No distress.  Cardiovascular: Normal rate and regular rhythm.   Pulmonary/Chest: Breath sounds normal. No respiratory distress. She has no wheezes.  Abdominal: Soft. Bowel sounds are normal. There is no tenderness.  Musculoskeletal:  No CVA tenderness.     BP 128/80 mmHg  Pulse 88  Temp(Src) 97.7 F (36.5 C) (Oral)  Resp 20  Ht 5' 0.75" (1.543 m)  Wt 157 lb (71.215 kg)  BMI 29.91 kg/m2  SpO2 97%  LMP 02/28/1990 Wt Readings from Last 3 Encounters:  04/24/15 157 lb (71.215 kg)  03/03/15 154 lb 6 oz (70.024 kg)  01/15/15 155 lb 12 oz (70.648 kg)     Lab Results  Component Value  Date   WBC 7.0 10/23/2014   HGB 14.0 10/23/2014   HCT 40.9 10/23/2014   PLT 216.0 10/23/2014   GLUCOSE 99 04/01/2015   CHOL 179 04/01/2015   TRIG 264.0* 04/01/2015   HDL 49.70 04/01/2015   LDLDIRECT 98.0 04/01/2015   LDLCALC 116* 06/21/2013   ALT 44* 04/01/2015   AST 42* 04/01/2015   NA 139 04/01/2015   K 4.1 04/01/2015   CL 103 04/01/2015   CREATININE 0.91 04/01/2015   BUN 11 04/01/2015   CO2 28 04/01/2015   TSH 2.23 10/23/2014       Assessment & Plan:   Problem List Items Addressed This Visit    Urinary frequency    Urine dip as outlined.  Reviewed.  Symptoms as outlined.  Have improved.  Await culture results.  cipro rx given in case symptoms worsen.  Follow.        Other Visit Diagnoses    Need for prophylactic vaccination and inoculation against viral hepatitis    -  Primary    Relevant Orders    Hepatitis A hepatitis B combined vaccine IM (Completed)    Frequency of urination        Relevant Orders    POCT Urinalysis Dipstick (Automated)    CULTURE, URINE COMPREHENSIVE    Abdominal pressure        Relevant Orders    POCT Urinalysis Dipstick (Automated)        Einar Pheasant, MD

## 2015-04-24 NOTE — Progress Notes (Signed)
Pre visit review using our clinic review tool, if applicable. No additional management support is needed unless otherwise documented below in the visit note. 

## 2015-04-27 ENCOUNTER — Encounter: Payer: Self-pay | Admitting: Internal Medicine

## 2015-04-27 DIAGNOSIS — R350 Frequency of micturition: Secondary | ICD-10-CM | POA: Insufficient documentation

## 2015-04-27 NOTE — Assessment & Plan Note (Signed)
Urine dip as outlined.  Reviewed.  Symptoms as outlined.  Have improved.  Await culture results.  cipro rx given in case symptoms worsen.  Follow.

## 2015-04-30 ENCOUNTER — Ambulatory Visit: Payer: BLUE CROSS/BLUE SHIELD

## 2015-06-02 ENCOUNTER — Other Ambulatory Visit: Payer: Self-pay | Admitting: Internal Medicine

## 2015-06-03 ENCOUNTER — Other Ambulatory Visit: Payer: Self-pay | Admitting: Internal Medicine

## 2015-06-06 ENCOUNTER — Encounter: Payer: Self-pay | Admitting: Internal Medicine

## 2015-06-07 NOTE — Telephone Encounter (Signed)
She is not due a physical yet.  Had a physical 10/2014.  Ok to reschedule her appt from 07/02/15.  Block that appt slot.  I may need to be off part of this day.  I will let you know.

## 2015-06-29 ENCOUNTER — Encounter: Payer: Self-pay | Admitting: Internal Medicine

## 2015-06-30 MED ORDER — ALPRAZOLAM 0.25 MG PO TABS: 0.2500 mg | ORAL_TABLET | Freq: Every day | ORAL | Status: DC | PRN

## 2015-06-30 NOTE — Telephone Encounter (Signed)
rx ok'd for xanax #30 with 1 refill.  See note.  Wants sent to [facility] and delivered.

## 2015-06-30 NOTE — Telephone Encounter (Signed)
Xanax refill request.  Last seen 04/24/2015, last filled 04/21/2015.  Please advise.

## 2015-07-01 ENCOUNTER — Encounter: Payer: Self-pay | Admitting: Internal Medicine

## 2015-07-02 ENCOUNTER — Ambulatory Visit: Payer: BLUE CROSS/BLUE SHIELD | Admitting: Internal Medicine

## 2015-08-25 ENCOUNTER — Other Ambulatory Visit: Payer: Self-pay

## 2015-08-25 NOTE — Telephone Encounter (Signed)
Please advise refill in Dr. Bary Leriche absence, thanks

## 2015-08-26 MED ORDER — ALPRAZOLAM 0.25 MG PO TABS: 0.2500 mg | ORAL_TABLET | Freq: Every day | ORAL | Status: DC | PRN

## 2015-08-26 NOTE — Telephone Encounter (Signed)
Chart reviewed. Last refill was 2 months ago for 2 month supply. One month refill given to cover until patients PCP returns.

## 2015-10-03 ENCOUNTER — Encounter: Payer: Self-pay | Admitting: Internal Medicine

## 2015-10-03 ENCOUNTER — Other Ambulatory Visit: Payer: Self-pay | Admitting: Family Medicine

## 2015-10-03 ENCOUNTER — Other Ambulatory Visit: Payer: Self-pay

## 2015-10-03 ENCOUNTER — Other Ambulatory Visit: Payer: Self-pay | Admitting: Internal Medicine

## 2015-10-03 MED ORDER — ALPRAZOLAM 0.25 MG PO TABS: 0.2500 mg | ORAL_TABLET | Freq: Every day | ORAL | 0 refills | Status: DC | PRN

## 2015-10-03 NOTE — Telephone Encounter (Signed)
Last fill sent in 08/26/15. Last seen by Dr. Nicki Reaper for anxiety/stress related OV on 03/03/15.

## 2015-10-03 NOTE — Telephone Encounter (Signed)
Rx faxed to pharmacy  

## 2015-10-21 ENCOUNTER — Encounter: Payer: Self-pay | Admitting: Internal Medicine

## 2015-10-21 DIAGNOSIS — E7800 Pure hypercholesterolemia, unspecified: Secondary | ICD-10-CM

## 2015-10-21 DIAGNOSIS — R7989 Other specified abnormal findings of blood chemistry: Secondary | ICD-10-CM

## 2015-10-21 DIAGNOSIS — R945 Abnormal results of liver function studies: Secondary | ICD-10-CM

## 2015-10-21 NOTE — Telephone Encounter (Signed)
I have placed the order for fasting labs.  Ok to schedule prior to appt.

## 2015-10-23 ENCOUNTER — Other Ambulatory Visit (INDEPENDENT_AMBULATORY_CARE_PROVIDER_SITE_OTHER): Payer: BLUE CROSS/BLUE SHIELD

## 2015-10-23 DIAGNOSIS — R7989 Other specified abnormal findings of blood chemistry: Secondary | ICD-10-CM | POA: Diagnosis not present

## 2015-10-23 DIAGNOSIS — E7800 Pure hypercholesterolemia, unspecified: Secondary | ICD-10-CM

## 2015-10-23 DIAGNOSIS — R945 Abnormal results of liver function studies: Secondary | ICD-10-CM

## 2015-10-23 LAB — BASIC METABOLIC PANEL
BUN: 13 mg/dL (ref 6–23)
CHLORIDE: 106 meq/L (ref 96–112)
CO2: 27 meq/L (ref 19–32)
CREATININE: 0.9 mg/dL (ref 0.40–1.20)
Calcium: 9 mg/dL (ref 8.4–10.5)
GFR: 66.98 mL/min (ref >60.00)
Glucose, Bld: 99 mg/dL (ref 70–99)
Potassium: 4.2 mEq/L (ref 3.5–5.1)
Sodium: 139 mEq/L (ref 135–145)

## 2015-10-23 LAB — CBC WITH DIFFERENTIAL/PLATELET
BASOS PCT: 0.7 % (ref 0.0–3.0)
Basophils Absolute: 0.1 10*3/uL (ref 0.0–0.1)
EOS ABS: 0.3 10*3/uL (ref 0.0–0.7)
EOS PCT: 3.8 % (ref 0.0–5.0)
HCT: 40.2 % (ref 36.0–46.0)
Hemoglobin: 13.8 g/dL (ref 12.0–15.0)
LYMPHS ABS: 1.9 10*3/uL (ref 0.7–4.0)
Lymphocytes Relative: 24.1 % (ref 12.0–46.0)
MCHC: 34.4 g/dL (ref 30.0–36.0)
MCV: 98.8 fl (ref 78.0–100.0)
MONO ABS: 0.5 10*3/uL (ref 0.1–1.0)
Monocytes Relative: 7 % (ref 3.0–12.0)
NEUTROS ABS: 5.1 10*3/uL (ref 1.4–7.7)
NEUTROS PCT: 64.4 % (ref 43.0–77.0)
PLATELETS: 231 10*3/uL (ref 150.0–400.0)
RBC: 4.06 Mil/uL (ref 3.87–5.11)
RDW: 13.3 % (ref 11.5–15.5)
WBC: 7.9 10*3/uL (ref 4.0–10.5)

## 2015-10-23 LAB — LIPID PANEL
CHOL/HDL RATIO: 4
Cholesterol: 197 mg/dL (ref 0–200)
HDL: 50.9 mg/dL (ref >39.00)
NonHDL: 145.68
Triglycerides: 333 mg/dL — ABNORMAL HIGH (ref 0.0–149.0)
VLDL: 66.6 mg/dL — ABNORMAL HIGH (ref 0.0–40.0)

## 2015-10-23 LAB — HEPATIC FUNCTION PANEL
ALK PHOS: 50 U/L (ref 39–117)
ALT: 50 U/L — ABNORMAL HIGH (ref 0–35)
AST: 31 U/L (ref 0–37)
Albumin: 4.2 g/dL (ref 3.5–5.2)
BILIRUBIN DIRECT: 0.1 mg/dL (ref 0.0–0.3)
TOTAL PROTEIN: 6.8 g/dL (ref 6.0–8.3)
Total Bilirubin: 0.5 mg/dL (ref 0.2–1.2)

## 2015-10-23 LAB — LDL CHOLESTEROL, DIRECT: Direct LDL: 103 mg/dL

## 2015-10-23 LAB — TSH: TSH: 3.59 u[IU]/mL (ref 0.35–4.50)

## 2015-10-25 ENCOUNTER — Other Ambulatory Visit: Payer: Self-pay | Admitting: Internal Medicine

## 2015-10-25 ENCOUNTER — Encounter: Payer: Self-pay | Admitting: Internal Medicine

## 2015-10-28 ENCOUNTER — Ambulatory Visit (INDEPENDENT_AMBULATORY_CARE_PROVIDER_SITE_OTHER): Payer: BLUE CROSS/BLUE SHIELD | Admitting: Internal Medicine

## 2015-10-28 ENCOUNTER — Encounter: Payer: Self-pay | Admitting: Internal Medicine

## 2015-10-28 VITALS — BP 102/70 | HR 89 | Temp 98.0°F | Ht 61.0 in | Wt 156.2 lb

## 2015-10-28 DIAGNOSIS — E7800 Pure hypercholesterolemia, unspecified: Secondary | ICD-10-CM

## 2015-10-28 DIAGNOSIS — R7989 Other specified abnormal findings of blood chemistry: Secondary | ICD-10-CM

## 2015-10-28 DIAGNOSIS — F439 Reaction to severe stress, unspecified: Secondary | ICD-10-CM

## 2015-10-28 DIAGNOSIS — Z658 Other specified problems related to psychosocial circumstances: Secondary | ICD-10-CM

## 2015-10-28 DIAGNOSIS — N281 Cyst of kidney, acquired: Secondary | ICD-10-CM

## 2015-10-28 DIAGNOSIS — Q6100 Congenital renal cyst, unspecified: Secondary | ICD-10-CM

## 2015-10-28 DIAGNOSIS — R945 Abnormal results of liver function studies: Secondary | ICD-10-CM

## 2015-10-28 DIAGNOSIS — Z0000 Encounter for general adult medical examination without abnormal findings: Secondary | ICD-10-CM | POA: Diagnosis not present

## 2015-10-28 DIAGNOSIS — Z23 Encounter for immunization: Secondary | ICD-10-CM

## 2015-10-28 DIAGNOSIS — Z7289 Other problems related to lifestyle: Secondary | ICD-10-CM

## 2015-10-28 DIAGNOSIS — Z789 Other specified health status: Secondary | ICD-10-CM

## 2015-10-28 NOTE — Progress Notes (Signed)
Pre visit review using our clinic review tool, if applicable. No additional management support is needed unless otherwise documented below in the visit note. 

## 2015-10-28 NOTE — Progress Notes (Signed)
[name], female   DOB: [birth year], 65 y.o.   MRN: [id number]   Subjective:    [name], female    DOB: [birth year], 65 y.o.   MRN: [id number]  HPI  Patient here for her physical exam.  Is followed by Kara Burns - s/p facelift and perioral laser 05/05/15.   Doing well. Still with increased stress.  She is still seeing a Social worker.  Overall feels things are stable.  Tries to stay active.  No chest pain.  No sob.  No acid reflux.  Bowels stable.  ALT slightly elevated - 50.  Discussed diet, exercise and weight loss.     Past Medical History:  Diagnosis Date  . Abdominal wall hernia    secondary to large left renal cyst removal  . Atrophic vaginitis   . Colon polyps   . Diverticulosis   . Fibrocystic breast disease   . GERD (gastroesophageal reflux disease)   . Hyperlipidemia   . Postmenopausal   . Sinusitis    Past Surgical History:  Procedure Laterality Date  . ABDOMINAL HYSTERECTOMY  2005   partial  . APPENDECTOMY    . BLEPHAROPLASTY    . Bone Spur     removal - left index finger  . CATARACT EXTRACTION, BILATERAL  2000, 2005  . EXCISION MORTON'S NEUROMA  [id number]   Kara Burns  . HERNIA REPAIR    . lumb hernia    . RENAL CYST EXCISION    . TUBAL LIGATION     Family History  Problem Relation Age of Onset  . Cancer Father     prostate  . Lung cancer      parent  . Hypercholesterolemia      parent   Social History   Social History  . Marital status: Married    Spouse name: N/A  . Number of children: 2  . Years of education: N/A   Social History Main Topics  . Smoking status: Former Research scientist (life sciences)  . Smokeless tobacco: Never Used  . Alcohol use 0.0 oz/week  . Drug use: No  . Sexual activity: Not Asked   Other Topics Concern  . None   Social History Narrative  . None    Outpatient Encounter Prescriptions as of 10/28/2015  Medication Sig  . ALPRAZolam (XANAX) 0.25 MG tablet Take 1 tablet (0.25 mg total) by mouth daily as  needed.  Marland Kitchen buPROPion (WELLBUTRIN XL) 150 MG 24 hr tablet TAKE THREE TABLETS DAILY  . estradiol (ESTRACE) 0.5 MG tablet TAKE 1 TABLET BY MOUTH EVERY DAY  . fluticasone (FLONASE) 50 MCG/ACT nasal spray Place 2 sprays into both nostrils daily. (Patient taking differently: Place 2 sprays into both nostrils daily as needed. )  . MILK THISTLE PO Take by mouth 2 (two) times daily.  . Omega-3 Fatty Acids (FISH OIL PO) Take by mouth daily.  . pantoprazole (PROTONIX) 40 MG tablet TAKE 1 TABLET(40 MG) BY MOUTH DAILY  . [DISCONTINUED] ciprofloxacin (CIPRO) 500 MG tablet Take 1 tablet (500 mg total) by mouth 2 (two) times daily.  . [DISCONTINUED] ALPRAZolam (XANAX) 0.25 MG tablet Take 1 tablet (0.25 mg total) by mouth daily as needed.  . [DISCONTINUED] buPROPion (WELLBUTRIN XL) 150 MG 24 hr tablet TAKE THREE TABLETS DAILY   No facility-administered encounter medications on file as of 10/28/2015.     Review of Systems  Constitutional: Negative for appetite change and unexpected weight change.  HENT: Negative for congestion and sinus pressure.  Eyes: Negative for pain and visual disturbance.  Respiratory: Negative for cough, chest tightness and shortness of breath.   Cardiovascular: Negative for chest pain, palpitations and leg swelling.  Gastrointestinal: Negative for abdominal pain, diarrhea, nausea and vomiting.  Genitourinary: Negative for difficulty urinating and dysuria.  Musculoskeletal: Negative for back pain and joint swelling.  Skin: Negative for color change and rash.  Neurological: Negative for dizziness, light-headedness and headaches.  Hematological: Negative for adenopathy. Does not bruise/bleed easily.  Psychiatric/Behavioral: Negative for agitation and dysphoric mood.       Objective:    Physical Exam  Constitutional: She is oriented to person, place, and time. She appears well-developed and well-nourished. No distress.  HENT:  Nose: Nose normal.  Mouth/Throat: Oropharynx is  clear and moist.  Eyes: Right eye exhibits no discharge. Left eye exhibits no discharge. No scleral icterus.  Neck: Neck supple. No thyromegaly present.  Cardiovascular: Normal rate and regular rhythm.   Pulmonary/Chest: Breath sounds normal. No accessory muscle usage. No tachypnea. No respiratory distress. She has no decreased breath sounds. She has no wheezes. She has no rhonchi. Right breast exhibits no inverted nipple, no mass, no nipple discharge and no tenderness (no axillary adenopathy). Left breast exhibits no inverted nipple, no mass, no nipple discharge and no tenderness (no axilarry adenopathy).  Abdominal: Soft. Bowel sounds are normal. There is no tenderness.  Musculoskeletal: She exhibits no edema or tenderness.  Lymphadenopathy:    She has no cervical adenopathy.  Neurological: She is alert and oriented to person, place, and time.  Skin: Skin is warm. No rash noted. No erythema.  Psychiatric: She has a normal mood and affect. Her behavior is normal.    BP 102/70   Pulse 89   Temp 98 F (36.7 C) (Oral)   Ht 5\' 1"  (1.549 m)   Wt 156 lb 3.2 oz (70.9 kg)   LMP 02/28/1990   SpO2 95%   BMI 29.51 kg/m  Wt Readings from Last 3 Encounters:  10/28/15 156 lb 3.2 oz (70.9 kg)  04/24/15 157 lb (71.2 kg)  03/03/15 154 lb 6 oz (70 kg)     Lab Results  Component Value Date   WBC 7.9 10/23/2015   HGB 13.8 10/23/2015   HCT 40.2 10/23/2015   PLT 231.0 10/23/2015   GLUCOSE 99 10/23/2015   CHOL 197 10/23/2015   TRIG 333.0 (H) 10/23/2015   HDL 50.90 10/23/2015   LDLDIRECT 103.0 10/23/2015   LDLCALC 116 (H) 06/21/2013   ALT 50 (H) 10/23/2015   AST 31 10/23/2015   NA 139 10/23/2015   K 4.2 10/23/2015   CL 106 10/23/2015   CREATININE 0.90 10/23/2015   BUN 13 10/23/2015   CO2 27 10/23/2015   TSH 3.59 10/23/2015       Assessment & Plan:   Problem List Items Addressed This Visit    Abnormal liver function test    Abdominal ultrasound revealed fatty liver.  Saw Kara Burns.  Follow liver function tests.  Diet, exercise and weight loss.        Relevant Orders   Hepatic function panel   Alcohol use ([place])    Discussed the need to decrease alcohol intake.  States she has decreased.  Follow.        Health care maintenance    Physical today 10/28/15.  Mammogram 12/24/14 - Birads II.  Colonoscopy 03/07/14.        Hypercholesterolemia    Low cholesterol diet and exercise.  Follow lipid panel.  Relevant Orders   Lipid panel   Basic metabolic panel   Renal cyst    Followed by urology (Kara Barbaraann Barthel).  Has previously been stable.  Due f/u.  She plans to call.        Stress    Seeing her therapist.  Feels things are stable.  Follow.         Other Visit Diagnoses    Routine general medical examination at a health care facility    -  Primary   Encounter for immunization       Relevant Orders   Flu Vaccine QUAD 36+ mos IM (Completed)       Einar Pheasant, MD

## 2015-11-02 ENCOUNTER — Encounter: Payer: Self-pay | Admitting: Internal Medicine

## 2015-11-02 MED ORDER — BUPROPION HCL ER (XL) 150 MG PO TB24: ORAL_TABLET | ORAL | 2 refills | Status: DC

## 2015-11-02 MED ORDER — ALPRAZOLAM 0.25 MG PO TABS: 0.2500 mg | ORAL_TABLET | Freq: Every day | ORAL | 0 refills | Status: DC | PRN

## 2015-11-02 NOTE — Assessment & Plan Note (Signed)
Discussed the need to decrease alcohol intake.  States she has decreased.  Follow.

## 2015-11-02 NOTE — Assessment & Plan Note (Signed)
Followed by urology (Dr Barbaraann Barthel).  Has previously been stable.  Due f/u.  She plans to call.

## 2015-11-02 NOTE — Assessment & Plan Note (Signed)
Physical today 10/28/15.  Mammogram 12/24/14 - Birads II.  Colonoscopy 03/07/14.

## 2015-11-02 NOTE — Assessment & Plan Note (Signed)
Abdominal ultrasound revealed fatty liver.  Saw Dr Allyn Kenner.  Follow liver function tests.  Diet, exercise and weight loss.

## 2015-11-02 NOTE — Assessment & Plan Note (Signed)
Low cholesterol diet and exercise.  Follow lipid panel.   

## 2015-11-02 NOTE — Assessment & Plan Note (Signed)
Seeing her therapist.  Kara Burns things are stable.  Follow.

## 2015-11-30 ENCOUNTER — Other Ambulatory Visit: Payer: Self-pay | Admitting: Internal Medicine

## 2015-12-01 MED ORDER — ALPRAZOLAM 0.25 MG PO TABS: 0.2500 mg | ORAL_TABLET | Freq: Every day | ORAL | 1 refills | Status: DC | PRN

## 2015-12-01 MED ORDER — ESTRADIOL 0.5 MG PO TABS: 0.5000 mg | ORAL_TABLET | Freq: Every day | ORAL | 2 refills | Status: DC

## 2015-12-01 NOTE — Telephone Encounter (Signed)
Last filled 11/02/15 30 0rf

## 2015-12-01 NOTE — Telephone Encounter (Signed)
Faxed

## 2015-12-01 NOTE — Telephone Encounter (Signed)
Ok's rx for estrace #30 with 2 refills and xanax #30 with one refill.

## 2015-12-22 LAB — HM MAMMOGRAPHY

## 2015-12-23 ENCOUNTER — Encounter: Payer: Self-pay | Admitting: Internal Medicine

## 2015-12-28 ENCOUNTER — Encounter: Payer: Self-pay | Admitting: Internal Medicine

## 2015-12-29 ENCOUNTER — Other Ambulatory Visit: Payer: Self-pay | Admitting: Internal Medicine

## 2015-12-29 NOTE — Telephone Encounter (Signed)
I sent this to [facility] as well.

## 2015-12-29 NOTE — Telephone Encounter (Signed)
Pt confirmed appt

## 2015-12-29 NOTE — Telephone Encounter (Signed)
I have an opening tomorrow at 9:00.  Can put in that spot.  Please make sure she is ok to wait until tomorrow.  If any questions or problems, let me know.

## 2015-12-30 ENCOUNTER — Encounter: Payer: Self-pay | Admitting: Internal Medicine

## 2015-12-30 ENCOUNTER — Ambulatory Visit (INDEPENDENT_AMBULATORY_CARE_PROVIDER_SITE_OTHER): Payer: BLUE CROSS/BLUE SHIELD | Admitting: Internal Medicine

## 2015-12-30 DIAGNOSIS — R109 Unspecified abdominal pain: Secondary | ICD-10-CM | POA: Insufficient documentation

## 2015-12-30 DIAGNOSIS — R1030 Lower abdominal pain, unspecified: Secondary | ICD-10-CM

## 2015-12-30 LAB — BASIC METABOLIC PANEL
BUN: 11 mg/dL (ref 6–23)
CHLORIDE: 104 meq/L (ref 96–112)
CO2: 26 meq/L (ref 19–32)
CREATININE: 0.9 mg/dL (ref 0.40–1.20)
Calcium: 9 mg/dL (ref 8.4–10.5)
GFR: 66.94 mL/min (ref >60.00)
GLUCOSE: 165 mg/dL — AB (ref 70–99)
POTASSIUM: 4 meq/L (ref 3.5–5.1)
Sodium: 138 mEq/L (ref 135–145)

## 2015-12-30 LAB — POCT URINALYSIS DIPSTICK
Blood, UA: NEGATIVE
GLUCOSE UA: NEGATIVE
Ketones, UA: NEGATIVE
LEUKOCYTES UA: NEGATIVE
NITRITE UA: NEGATIVE
PH UA: 5.5
Protein, UA: NEGATIVE
Spec Grav, UA: >=1.03
UROBILINOGEN UA: 0.2

## 2015-12-30 LAB — CBC WITH DIFFERENTIAL/PLATELET
BASOS ABS: 0.1 10*3/uL (ref 0.0–0.1)
Basophils Relative: 0.7 % (ref 0.0–3.0)
Eosinophils Absolute: 0.2 10*3/uL (ref 0.0–0.7)
Eosinophils Relative: 2.2 % (ref 0.0–5.0)
HEMATOCRIT: 39.5 % (ref 36.0–46.0)
HEMOGLOBIN: 13.4 g/dL (ref 12.0–15.0)
LYMPHS PCT: 22.2 % (ref 12.0–46.0)
Lymphs Abs: 1.7 10*3/uL (ref 0.7–4.0)
MCHC: 34 g/dL (ref 30.0–36.0)
MCV: 97.5 fl (ref 78.0–100.0)
MONOS PCT: 6.8 % (ref 3.0–12.0)
Monocytes Absolute: 0.5 10*3/uL (ref 0.1–1.0)
NEUTROS ABS: 5.3 10*3/uL (ref 1.4–7.7)
Neutrophils Relative %: 68.1 % (ref 43.0–77.0)
PLATELETS: 216 10*3/uL (ref 150.0–400.0)
RBC: 4.05 Mil/uL (ref 3.87–5.11)
RDW: 13.1 % (ref 11.5–15.5)
WBC: 7.8 10*3/uL (ref 4.0–10.5)

## 2015-12-30 MED ORDER — CIPROFLOXACIN HCL 500 MG PO TABS: 500.0000 mg | ORAL_TABLET | Freq: Two times a day (BID) | ORAL | 0 refills | Status: DC

## 2015-12-30 MED ORDER — FLUTICASONE PROPIONATE 50 MCG/ACT NA SUSP: 2.0000 | Freq: Every day | NASAL | 2 refills | Status: DC

## 2015-12-30 NOTE — Patient Instructions (Signed)
Take a probiotic while you are on the antibiotic and for two weeks after completing the antibiotic.    Probiotics:  Florastor, align or culturelle.

## 2015-12-30 NOTE — Progress Notes (Signed)
Pre visit review using our clinic review tool, if applicable. No additional management support is needed unless otherwise documented below in the visit note. 

## 2015-12-30 NOTE — Progress Notes (Signed)
[name], female   DOB: April 15, 1950, 65 y.o.   MRN: [id number]   Subjective:    [name], female    DOB: 1950/10/27, 65 y.o.   MRN: [id number]  HPI  Patient here as a work in visit.  She reports lower abdominal pain for the last 3 days.  States that starting three days ago, noticed some low back discomfort.  This has resolved.  Then noticed some pressure in her lower abdomen.  Woke her up.  The pain is some better now.  Has been eating.  No nausea or vomiting.  No significant bowel change.  Was a little constipated previously.  No vaginal itching or burning.  No discharge.  She does report when she urinates, she notices some pressure and urgency.  No hematuria.  No fever.  Again feel some better today.  Was initially concerned may have diverticulosis, but as symptoms progressed was more concerned over a possible urinary tract infection.     Past Medical History:  Diagnosis Date  . Abdominal wall hernia    secondary to large left renal cyst removal  . Atrophic vaginitis   . Colon polyps   . Diverticulosis   . Fibrocystic breast disease   . GERD (gastroesophageal reflux disease)   . Hyperlipidemia   . Postmenopausal   . Sinusitis    Past Surgical History:  Procedure Laterality Date  . ABDOMINAL HYSTERECTOMY  2005   partial  . APPENDECTOMY    . BLEPHAROPLASTY    . Bone Spur     removal - left index finger  . CATARACT EXTRACTION, BILATERAL  2000, 2005  . EXCISION MORTON'S NEUROMA  [id number]   Dr. Albertine Patricia  . HERNIA REPAIR    . lumb hernia    . RENAL CYST EXCISION    . TUBAL LIGATION     Family History  Problem Relation Age of Onset  . Cancer Father     prostate  . Lung cancer      parent  . Hypercholesterolemia      parent   Social History   Social History  . Marital status: Married    Spouse name: N/A  . Number of children: 2  . Years of education: N/A   Social History Main Topics  . Smoking status: Former Research scientist (life sciences)  . Smokeless  tobacco: Never Used  . Alcohol use 0.0 oz/week  . Drug use: No  . Sexual activity: Not Asked   Other Topics Concern  . None   Social History Narrative  . None    Outpatient Encounter Prescriptions as of 12/30/2015  Medication Sig  . ALPRAZolam (XANAX) 0.25 MG tablet Take 1 tablet (0.25 mg total) by mouth daily as needed.  Marland Kitchen buPROPion (WELLBUTRIN XL) 150 MG 24 hr tablet TAKE THREE TABLETS DAILY  . estradiol (ESTRACE) 0.5 MG tablet Take 1 tablet (0.5 mg total) by mouth daily.  . fluticasone (FLONASE) 50 MCG/ACT nasal spray Place 2 sprays into both nostrils daily.  Marland Kitchen MILK THISTLE PO Take by mouth 2 (two) times daily.  . Omega-3 Fatty Acids (FISH OIL PO) Take by mouth daily.  . pantoprazole (PROTONIX) 40 MG tablet TAKE 1 TABLET(40 MG) BY MOUTH DAILY  . [DISCONTINUED] fluticasone (FLONASE) 50 MCG/ACT nasal spray PLACE TWO SPRAYS INTO BOTH NOSTRILS DAILY  . ciprofloxacin (CIPRO) 500 MG tablet Take 1 tablet (500 mg total) by mouth 2 (two) times daily.   No facility-administered encounter medications on file as of 12/30/2015.  Review of Systems  Constitutional: Negative for appetite change, fever and unexpected weight change.  Respiratory: Negative for chest tightness and shortness of breath.   Cardiovascular: Negative for chest pain and leg swelling.  Gastrointestinal: Positive for constipation. Negative for diarrhea, nausea and vomiting.       Lower abdominal pain as outlined.    Genitourinary: Negative for difficulty urinating and vaginal discharge.       Some urinary pressure and urgency.    Musculoskeletal:       Back pain resolved.   Skin: Negative for rash.  Neurological: Negative for headaches.       Objective:    Physical Exam  Constitutional: She appears well-developed and well-nourished. No distress.  Cardiovascular: Normal rate and regular rhythm.   Pulmonary/Chest: Breath sounds normal. No respiratory distress. She has no wheezes.  Abdominal: Soft. Bowel sounds  are normal.  Minimal suprapubic discomfort.  No rebound or guarding.    Skin: No rash noted. No erythema.    BP 138/82   Pulse 83   Temp 98.1 F (36.7 C) (Oral)   Wt 157 lb 12.8 oz (71.6 kg)   LMP 02/28/1990   SpO2 95%   BMI 29.82 kg/m  Wt Readings from Last 3 Encounters:  12/30/15 157 lb 12.8 oz (71.6 kg)  10/28/15 156 lb 3.2 oz (70.9 kg)  04/24/15 157 lb (71.2 kg)     Lab Results  Component Value Date   WBC 7.8 12/30/2015   HGB 13.4 12/30/2015   HCT 39.5 12/30/2015   PLT 216.0 12/30/2015   GLUCOSE 165 (H) 12/30/2015   CHOL 197 10/23/2015   TRIG 333.0 (H) 10/23/2015   HDL 50.90 10/23/2015   LDLDIRECT 103.0 10/23/2015   LDLCALC 116 (H) 06/21/2013   ALT 50 (H) 10/23/2015   AST 31 10/23/2015   NA 138 12/30/2015   K 4.0 12/30/2015   CL 104 12/30/2015   CREATININE 0.90 12/30/2015   BUN 11 12/30/2015   CO2 26 12/30/2015   TSH 3.59 10/23/2015       Assessment & Plan:   Problem List Items Addressed This Visit    Abdominal pain    Symptoms and exam as outlined.  Urine dip negative.  Will send culture.  Has the urgency and discomfort with urination.  No bowel change.  Feel less likely to be diverticulitis.  Discussed further w/up including CT scan, etc.  She wanted to hold on CT scan.  Gave her a prescription for cipro.  She is going out of town.  Can start this while waiting for culture results.  Discussed with her the need to be reevaluated if symptoms change, worsen or do not resolve.  Pt comfortable with this plan.        Relevant Orders   POCT Urinalysis Dipstick (Completed)   CBC with Differential/Platelet (Completed)   CULTURE, URINE COMPREHENSIVE (Completed)   Basic metabolic panel (Completed)       Einar Pheasant, MD

## 2015-12-31 ENCOUNTER — Encounter: Payer: Self-pay | Admitting: Internal Medicine

## 2016-01-01 LAB — CULTURE, URINE COMPREHENSIVE: ORGANISM ID, BACTERIA: NO GROWTH

## 2016-01-03 ENCOUNTER — Encounter: Payer: Self-pay | Admitting: Internal Medicine

## 2016-01-04 ENCOUNTER — Encounter: Payer: Self-pay | Admitting: Internal Medicine

## 2016-01-04 NOTE — Assessment & Plan Note (Signed)
Symptoms and exam as outlined.  Urine dip negative.  Will send culture.  Has the urgency and discomfort with urination.  No bowel change.  Feel less likely to be diverticulitis.  Discussed further w/up including CT scan, etc.  She wanted to hold on CT scan.  Gave her a prescription for cipro.  She is going out of town.  Can start this while waiting for culture results.  Discussed with her the need to be reevaluated if symptoms change, worsen or do not resolve.  Pt comfortable with this plan.

## 2016-01-22 ENCOUNTER — Other Ambulatory Visit: Payer: Self-pay | Admitting: Internal Medicine

## 2016-01-27 ENCOUNTER — Other Ambulatory Visit: Payer: Self-pay | Admitting: Internal Medicine

## 2016-01-27 ENCOUNTER — Encounter: Payer: Self-pay | Admitting: Internal Medicine

## 2016-01-28 NOTE — Telephone Encounter (Signed)
Last filled 01/05/16. Last OV 12/30/15. Has follow up on 03/10/16.

## 2016-01-29 ENCOUNTER — Telehealth: Payer: Self-pay | Admitting: Internal Medicine

## 2016-01-29 ENCOUNTER — Ambulatory Visit: Payer: BLUE CROSS/BLUE SHIELD | Admitting: Family

## 2016-01-29 NOTE — Telephone Encounter (Signed)
FYI - Pt cancelled appt, feeling better. °

## 2016-01-29 NOTE — Telephone Encounter (Signed)
FYI

## 2016-02-05 ENCOUNTER — Encounter: Payer: Self-pay | Admitting: Family

## 2016-02-05 ENCOUNTER — Ambulatory Visit (INDEPENDENT_AMBULATORY_CARE_PROVIDER_SITE_OTHER): Payer: BLUE CROSS/BLUE SHIELD | Admitting: Family

## 2016-02-05 ENCOUNTER — Ambulatory Visit: Payer: BLUE CROSS/BLUE SHIELD | Admitting: Family

## 2016-02-05 VITALS — BP 112/70 | HR 81 | Temp 97.7°F | Ht 61.0 in | Wt 159.0 lb

## 2016-02-05 DIAGNOSIS — M25561 Pain in right knee: Secondary | ICD-10-CM

## 2016-02-05 NOTE — Progress Notes (Signed)
Pre visit review using our clinic review tool, if applicable. No additional management support is needed unless otherwise documented below in the visit note. 

## 2016-02-05 NOTE — Patient Instructions (Signed)
Let's treat conservatively as we discussed.   Over-the-counter medications you may try for arthritic pain include:   ThermaCare patches   Capsaicin cream   Icy hot  Home exercises as we discussed below/handout.  If conservative treatment doesn't yield results, we will consider physical therapy, consult to Sports Medicine/Orthopedics for further evaluation, and imaging.   If there is no improvement in your symptoms, or if there is any worsening of symptoms, or if you have any additional concerns, please return for re-evaluation; or, if we are closed, consider going to the Emergency Room for evaluation if symptoms urgent.   If your symptom do not improve, you may go to walk in orthopedic clinic. Information below:  [facility] [address]  M-F 1-7:30pm  [phone]    Knee Exercises Ask your health care provider which exercises are safe for you. Do exercises exactly as told by your health care provider and adjust them as directed. It is normal to feel mild stretching, pulling, tightness, or discomfort as you do these exercises, but you should stop right away if you feel sudden pain or your pain gets worse.Do not begin these exercises until told by your health care provider. STRETCHING AND RANGE OF MOTION EXERCISES  These exercises warm up your muscles and joints and improve the movement and flexibility of your knee. These exercises also help to relieve pain, numbness, and tingling. Exercise A: Knee Extension, Prone 1. Lie on your abdomen on a bed. 2. Place your left / right knee just beyond the edge of the surface so your knee is not on the bed. You can put a towel under your left / right thigh just above your knee for comfort. 3. Relax your leg muscles and allow gravity to straighten your knee. You should feel a stretch behind your left / right knee. 4. Hold this position for __________ seconds. 5. Scoot up so your knee is supported between repetitions. Repeat  __________ times. Complete this stretch __________ times a day. Exercise B: Knee Flexion, Active  1. Lie on your back with both knees straight. If this causes back discomfort, bend your left / right knee so your foot is flat on the floor. 2. Slowly slide your left / right heel back toward your buttocks until you feel a gentle stretch in the front of your knee or thigh. 3. Hold this position for __________ seconds. 4. Slowly slide your left / right heel back to the starting position. Repeat __________ times. Complete this exercise __________ times a day. Exercise C: Quadriceps, Prone  1. Lie on your abdomen on a firm surface, such as a bed or padded floor. 2. Bend your left / right knee and hold your ankle. If you cannot reach your ankle or pant leg, loop a belt around your foot and grab the belt instead. 3. Gently pull your heel toward your buttocks. Your knee should not slide out to the side. You should feel a stretch in the front of your thigh and knee. 4. Hold this position for __________ seconds. Repeat __________ times. Complete this stretch __________ times a day. Exercise D: Hamstring, Supine 1. Lie on your back. 2. Loop a belt or towel over the ball of your left / right foot. The ball of your foot is on the walking surface, right under your toes. 3. Straighten your left / right knee and slowly pull on the belt to raise your leg until you feel a gentle stretch behind your knee.  Do  not let your left / right knee bend while you do this.  Keep your other leg flat on the floor. 4. Hold this position for __________ seconds. Repeat __________ times. Complete this stretch __________ times a day. STRENGTHENING EXERCISES  These exercises build strength and endurance in your knee. Endurance is the ability to use your muscles for a long time, even after they get tired. Exercise E: Quadriceps, Isometric  1. Lie on your back with your left / right leg extended and your other knee bent. Put a  rolled towel or small pillow under your knee if told by your health care provider. 2. Slowly tense the muscles in the front of your left / right thigh. You should see your kneecap slide up toward your hip or see increased dimpling just above the knee. This motion will push the back of the knee toward the floor. 3. For __________ seconds, keep the muscle as tight as you can without increasing your pain. 4. Relax the muscles slowly and completely. Repeat __________ times. Complete this exercise __________ times a day. Exercise F: Straight Leg Raises - Quadriceps 1. Lie on your back with your left / right leg extended and your other knee bent. 2. Tense the muscles in the front of your left / right thigh. You should see your kneecap slide up or see increased dimpling just above the knee. Your thigh may even shake a bit. 3. Keep these muscles tight as you raise your leg 4-6 inches (10-15 cm) off the floor. Do not let your knee bend. 4. Hold this position for __________ seconds. 5. Keep these muscles tense as you lower your leg. 6. Relax your muscles slowly and completely after each repetition. Repeat __________ times. Complete this exercise __________ times a day. Exercise G: Hamstring, Isometric 1. Lie on your back on a firm surface. 2. Bend your left / right knee approximately __________ degrees. 3. Dig your left / right heel into the surface as if you are trying to pull it toward your buttocks. Tighten the muscles in the back of your thighs to dig as hard as you can without increasing any pain. 4. Hold this position for __________ seconds. 5. Release the tension gradually and allow your muscles to relax completely for __________ seconds after each repetition. Repeat __________ times. Complete this exercise __________ times a day. Exercise H: Hamstring Curls  If told by your health care provider, do this exercise while wearing ankle weights. Begin with __________ weights. Then increase the weight  by 1 lb (0.5 kg) increments. Do not wear ankle weights that are more than __________. 1. Lie on your abdomen with your legs straight. 2. Bend your left / right knee as far as you can without feeling pain. Keep your hips flat against the floor. 3. Hold this position for __________ seconds. 4. Slowly lower your leg to the starting position. Repeat __________ times. Complete this exercise __________ times a day. Exercise I: Squats (Quadriceps) 1. Stand in front of a table, with your feet and knees pointing straight ahead. You may rest your hands on the table for balance but not for support. 2. Slowly bend your knees and lower your hips like you are going to sit in a chair.  Keep your weight over your heels, not over your toes.  Keep your lower legs upright so they are parallel with the table legs.  Do not let your hips go lower than your knees.  Do not bend lower than told by your health  care provider.  If your knee pain increases, do not bend as low. 3. Hold the squat position for __________ seconds. 4. Slowly push with your legs to return to standing. Do not use your hands to pull yourself to standing. Repeat __________ times. Complete this exercise __________ times a day. Exercise J: Wall Slides (Quadriceps)  1. Lean your back against a smooth wall or door while you walk your feet out 18-24 inches (46-61 cm) from it. 2. Place your feet hip-width apart. 3. Slowly slide down the wall or door until your knees bend __________ degrees. Keep your knees over your heels, not over your toes. Keep your knees in line with your hips. 4. Hold for __________ seconds. Repeat __________ times. Complete this exercise __________ times a day. Exercise K: Straight Leg Raises - Hip Abductors 1. Lie on your side with your left / right leg in the top position. Lie so your head, shoulder, knee, and hip line up. You may bend your bottom knee to help you keep your balance. 2. Roll your hips slightly forward so  your hips are stacked directly over each other and your left / right knee is facing forward. 3. Leading with your heel, lift your top leg 4-6 inches (10-15 cm). You should feel the muscles in your outer hip lifting.  Do not let your foot drift forward.  Do not let your knee roll toward the ceiling. 4. Hold this position for __________ seconds. 5. Slowly return your leg to the starting position. 6. Let your muscles relax completely after each repetition. Repeat __________ times. Complete this exercise __________ times a day. Exercise L: Straight Leg Raises - Hip Extensors 1. Lie on your abdomen on a firm surface. You can put a pillow under your hips if that is more comfortable. 2. Tense the muscles in your buttocks and lift your left / right leg about 4-6 inches (10-15 cm). Keep your knee straight as you lift your leg. 3. Hold this position for __________ seconds. 4. Slowly lower your leg to the starting position. 5. Let your leg relax completely after each repetition. Repeat __________ times. Complete this exercise __________ times a day. This information is not intended to replace advice given to you by your health care provider. Make sure you discuss any questions you have with your health care provider. Document Released: 12/09/2004 Document Revised: 10/20/2015 Document Reviewed: 12/01/2014 Elsevier Interactive Patient Education  2017 [employer].

## 2016-02-05 NOTE — Progress Notes (Signed)
Subjective:    [name], female    DOB: 08/14/1950, 65 y.o.   MRN: [id number]  CC: Kara Burns is a 65 y.o. female who presents today for an acute visit.    HPI: CC: right knee pain for past week, better today. Describes as excoriating pain worse with walking on medial side of knee. Also hurts when sitting. Going down the stairs bother it, worse with activity. Hasn't tried any medications. No catching or giving way. No injury or loud pop.    Does note fall in 3 months ago on right knee which completely improved.       HISTORY:  Past Medical History:  Diagnosis Date  . Abdominal wall hernia    secondary to large left renal cyst removal  . Atrophic vaginitis   . Colon polyps   . Diverticulosis   . Fibrocystic breast disease   . GERD (gastroesophageal reflux disease)   . Hyperlipidemia   . Postmenopausal   . Sinusitis    Past Surgical History:  Procedure Laterality Date  . ABDOMINAL HYSTERECTOMY  2005   partial  . APPENDECTOMY    . BLEPHAROPLASTY    . Bone Spur     removal - left index finger  . CATARACT EXTRACTION, BILATERAL  2000, 2005  . EXCISION MORTON'S NEUROMA  [id number]   Dr. Albertine Patricia  . HERNIA REPAIR    . lumb hernia    . RENAL CYST EXCISION    . TUBAL LIGATION     Family History  Problem Relation Age of Onset  . Cancer Father     prostate  . Lung cancer      parent  . Hypercholesterolemia      parent    Allergies: Erythromycin and Erythromycin ethylsuccinate Current Outpatient Prescriptions on File Prior to Visit  Medication Sig Dispense Refill  . ALPRAZolam (XANAX) 0.25 MG tablet TAKE 1 TABLET BY MOUTH DAILY AS NEEDED 30 tablet 0  . buPROPion (WELLBUTRIN XL) 150 MG 24 hr tablet TAKE 3 TABLETS BY MOUTH DAILY 90 tablet 2  . estradiol (ESTRACE) 0.5 MG tablet Take 1 tablet (0.5 mg total) by mouth daily. 30 tablet 2  . fluticasone (FLONASE) 50 MCG/ACT nasal spray Place 2 sprays into both nostrils daily. 16 g 2  . MILK THISTLE  PO Take by mouth 2 (two) times daily.    . Omega-3 Fatty Acids (FISH OIL PO) Take by mouth daily.    . pantoprazole (PROTONIX) 40 MG tablet TAKE 1 TABLET(40 MG) BY MOUTH DAILY 30 tablet 11   No current facility-administered medications on file prior to visit.     Social History  Substance Use Topics  . Smoking status: Former Research scientist (life sciences)  . Smokeless tobacco: Never Used  . Alcohol use 0.0 oz/week    Review of Systems  Constitutional: Negative for chills and fever.  Respiratory: Negative for cough.   Cardiovascular: Negative for chest pain and palpitations.  Gastrointestinal: Negative for nausea and vomiting.  Musculoskeletal: Negative for back pain and joint swelling.      Objective:    BP 112/70   Pulse 81   Temp 97.7 F (36.5 C) (Oral)   Ht 5\' 1"  (1.549 m)   Wt 159 lb (72.1 kg)   LMP 02/28/1990   SpO2 97%   BMI 30.04 kg/m    Physical Exam  Constitutional: Kara Burns appears well-developed and well-nourished.  Eyes: Conjunctivae are normal.  Cardiovascular: Normal rate, regular rhythm, normal heart sounds and normal  pulses.   Pulmonary/Chest: Effort normal and breath sounds normal. Kara Burns has no wheezes. Kara Burns has no rhonchi. Kara Burns has no rales.  Musculoskeletal:       Right knee: Kara Burns exhibits normal range of motion, no swelling, no effusion and no erythema. Tenderness found. Medial joint line tenderness noted.  Bilateral knees are symmetric. No effusion appreciated. No increase in warmth or erythema. Crepitus felt with flexion of bilateral knees.  Right knee:  Full ROM with extension and flexion. Pain medial joint line.  No catching with McMurray maneuver. No patellar apprehension. Negative anterior drawer and lachman's- no laxity appreciated.   Neurological: Kara Burns is alert.  Skin: Skin is warm and dry.  Psychiatric: Kara Burns has a normal mood and affect. Her speech is normal and behavior is normal. Thought content normal.  Vitals reviewed.      Assessment & Plan:   1. Acute pain of  right knee Symptoms consistent with arthritic changes and/or meniscal etiology. No effusion. Patient and I agreed on conservative management at this time. Education provided.     I have discontinued Kara Burns ciprofloxacin. I am also having her maintain her MILK THISTLE PO, Omega-3 Fatty Acids (FISH OIL PO), pantoprazole, estradiol, fluticasone, buPROPion, and ALPRAZolam.   No orders of the defined types were placed in this encounter.   Return precautions given.   Risks, benefits, and alternatives of the medications and treatment plan prescribed today were discussed, and patient expressed understanding.   Education regarding symptom management and diagnosis given to patient on AVS.  Continue to follow with Einar Pheasant, MD for routine health maintenance.   Youlanda Mighty and I agreed with plan.   Mable Paris, FNP

## 2016-02-11 ENCOUNTER — Other Ambulatory Visit: Payer: Self-pay | Admitting: Internal Medicine

## 2016-02-23 ENCOUNTER — Other Ambulatory Visit: Payer: Self-pay | Admitting: Internal Medicine

## 2016-02-29 ENCOUNTER — Other Ambulatory Visit: Payer: Self-pay | Admitting: Internal Medicine

## 2016-03-01 MED ORDER — ALPRAZOLAM 0.25 MG PO TABS: 0.2500 mg | ORAL_TABLET | Freq: Every day | ORAL | 0 refills | Status: DC | PRN

## 2016-03-01 NOTE — Telephone Encounter (Signed)
Last OV 11/17 ok to fill medication.

## 2016-03-04 ENCOUNTER — Other Ambulatory Visit: Payer: BLUE CROSS/BLUE SHIELD

## 2016-03-10 ENCOUNTER — Ambulatory Visit: Payer: BLUE CROSS/BLUE SHIELD | Admitting: Internal Medicine

## 2016-03-11 ENCOUNTER — Other Ambulatory Visit: Payer: Self-pay | Admitting: Internal Medicine

## 2016-03-22 ENCOUNTER — Other Ambulatory Visit: Payer: Self-pay | Admitting: Internal Medicine

## 2016-03-29 ENCOUNTER — Other Ambulatory Visit: Payer: Self-pay | Admitting: Internal Medicine

## 2016-03-29 MED ORDER — ALPRAZOLAM 0.25 MG PO TABS: 0.2500 mg | ORAL_TABLET | Freq: Every day | ORAL | 1 refills | Status: DC | PRN

## 2016-03-29 NOTE — Telephone Encounter (Signed)
Pt last refill on Xanax was 03/01/16. Pt last OV was on 02/05/16. Ok to refill?

## 2016-03-29 NOTE — Telephone Encounter (Signed)
ok'd xanax #30 with one refill.  She needs a f/u appt scheduled.

## 2016-03-30 NOTE — Telephone Encounter (Signed)
Faxed

## 2016-03-30 NOTE — Telephone Encounter (Signed)
Just signed rx for xanax.  Should be in box or already faxed.

## 2016-04-07 ENCOUNTER — Other Ambulatory Visit: Payer: Self-pay | Admitting: Internal Medicine

## 2016-04-08 NOTE — Telephone Encounter (Signed)
Refilled on 03/11/2016 Last Ov: 10/28/2015 No f/u appt scheduled.   Please advise.

## 2016-04-09 NOTE — Telephone Encounter (Signed)
ok'd refill for estrogen #30 with no refills.  Needs f/u appt scheduled.

## 2016-04-14 ENCOUNTER — Encounter: Payer: Self-pay | Admitting: Internal Medicine

## 2016-04-14 NOTE — Telephone Encounter (Signed)
appointment made refill sent in

## 2016-04-19 ENCOUNTER — Other Ambulatory Visit: Payer: Self-pay | Admitting: Internal Medicine

## 2016-04-20 ENCOUNTER — Ambulatory Visit (INDEPENDENT_AMBULATORY_CARE_PROVIDER_SITE_OTHER): Payer: BLUE CROSS/BLUE SHIELD | Admitting: Internal Medicine

## 2016-04-20 ENCOUNTER — Encounter: Payer: Self-pay | Admitting: Internal Medicine

## 2016-04-20 VITALS — BP 120/72 | HR 81 | Temp 98.6°F | Resp 16 | Ht 61.0 in | Wt 160.0 lb

## 2016-04-20 DIAGNOSIS — F439 Reaction to severe stress, unspecified: Secondary | ICD-10-CM

## 2016-04-20 DIAGNOSIS — R7989 Other specified abnormal findings of blood chemistry: Secondary | ICD-10-CM

## 2016-04-20 DIAGNOSIS — Z7289 Other problems related to lifestyle: Secondary | ICD-10-CM

## 2016-04-20 DIAGNOSIS — Z789 Other specified health status: Secondary | ICD-10-CM | POA: Diagnosis not present

## 2016-04-20 DIAGNOSIS — Z1239 Encounter for other screening for malignant neoplasm of breast: Secondary | ICD-10-CM

## 2016-04-20 DIAGNOSIS — N281 Cyst of kidney, acquired: Secondary | ICD-10-CM

## 2016-04-20 DIAGNOSIS — E7800 Pure hypercholesterolemia, unspecified: Secondary | ICD-10-CM | POA: Diagnosis not present

## 2016-04-20 DIAGNOSIS — Z1231 Encounter for screening mammogram for malignant neoplasm of breast: Secondary | ICD-10-CM

## 2016-04-20 DIAGNOSIS — R945 Abnormal results of liver function studies: Secondary | ICD-10-CM

## 2016-04-20 NOTE — Assessment & Plan Note (Signed)
Low cholesterol diet and exercise.  Discussed with her today.  Follow lipid panel.

## 2016-04-20 NOTE — Progress Notes (Signed)
[name], female   DOB: Mar 26, 1950, 66 y.o.   MRN: [id number]   Subjective:    [name], female    DOB: [birth year], 66 y.o.   MRN: [id number]  HPI  Patient here for a scheduled follow up.  Recently evaluated by ENT for tinnitus.  Continuing to be followed. She is also being followed by Dr Nadeen Landau for facial surgery.  Doing well.  Has f/u planned at the end of this month.  Still with increased stress.  She is seeing her counselor.  She feels she is handling things relatively well.  Does not feel needs any further intervention at this time.  We discussed diet and exercise.  She has not been exercising and not watching her diet as well.  Discussed importance of diet, exercise and weight loss, especially with her liver issues.  She has also not followed up with urology.  We had discussed continued f/u at [facility].  She preferred this.  Discussed with her today.  Will place order for referral.  No chest pain.  Breathing stable.  Bowels stable.     Past Medical History:  Diagnosis Date  . Abdominal wall hernia    secondary to large left renal cyst removal  . Atrophic vaginitis   . Colon polyps   . Diverticulosis   . Fibrocystic breast disease   . GERD (gastroesophageal reflux disease)   . Hyperlipidemia   . Postmenopausal   . Sinusitis    Past Surgical History:  Procedure Laterality Date  . ABDOMINAL HYSTERECTOMY  2005   partial  . APPENDECTOMY    . BLEPHAROPLASTY    . Bone Spur     removal - left index finger  . CATARACT EXTRACTION, BILATERAL  2000, 2005  . EXCISION MORTON'S NEUROMA  [id number]   Dr. Albertine Patricia  . HERNIA REPAIR    . lumb hernia    . RENAL CYST EXCISION    . TUBAL LIGATION     Family History  Problem Relation Age of Onset  . Cancer Father     prostate  . Lung cancer      parent  . Hypercholesterolemia      parent   Social History   Social History  . Marital status: Married    Spouse name: N/A  . Number of  children: 2  . Years of education: N/A   Social History Main Topics  . Smoking status: Former Research scientist (life sciences)  . Smokeless tobacco: Never Used  . Alcohol use 0.0 oz/week  . Drug use: No  . Sexual activity: Not Asked   Other Topics Concern  . None   Social History Narrative  . None    Outpatient Encounter Prescriptions as of 04/20/2016  Medication Sig  . ALPRAZolam (XANAX) 0.25 MG tablet Take 1 tablet (0.25 mg total) by mouth daily as needed.  Marland Kitchen buPROPion (WELLBUTRIN XL) 150 MG 24 hr tablet TAKE 3 TABLETS BY MOUTH DAILY  . estradiol (ESTRACE) 0.5 MG tablet TAKE 1 TABLET(0.5 MG) BY MOUTH DAILY  . fluticasone (FLONASE) 50 MCG/ACT nasal spray Place 2 sprays into both nostrils daily.  Marland Kitchen MILK THISTLE PO Take by mouth 2 (two) times daily.  . Omega-3 Fatty Acids (FISH OIL PO) Take by mouth daily.  . pantoprazole (PROTONIX) 40 MG tablet TAKE 1 TABLET(40 MG) BY MOUTH DAILY   No facility-administered encounter medications on file as of 04/20/2016.     Review of Systems  Constitutional: Negative for appetite change  and unexpected weight change.  HENT: Negative for congestion and sinus pressure.   Respiratory: Negative for cough, chest tightness and shortness of breath.   Cardiovascular: Negative for chest pain, palpitations and leg swelling.  Gastrointestinal: Negative for diarrhea, nausea and vomiting.  Genitourinary: Negative for difficulty urinating and dysuria.  Musculoskeletal: Negative for back pain and joint swelling.  Skin: Negative for color change and rash.  Neurological: Negative for dizziness, light-headedness and headaches.  Psychiatric/Behavioral: Negative for agitation and dysphoric mood.       Objective:    Physical Exam  Constitutional: She appears well-developed and well-nourished. No distress.  HENT:  Nose: Nose normal.  Mouth/Throat: Oropharynx is clear and moist.  Neck: Neck supple. No thyromegaly present.  Cardiovascular: Normal rate and regular rhythm.     Pulmonary/Chest: Breath sounds normal. No respiratory distress. She has no wheezes.  Abdominal: Soft. Bowel sounds are normal. There is no tenderness.  Hernia - no significant tenderness.    Musculoskeletal: She exhibits no edema or tenderness.  Lymphadenopathy:    She has no cervical adenopathy.  Skin: No rash noted. No erythema.  Psychiatric: She has a normal mood and affect. Her behavior is normal.    BP 120/72 (BP Location: Left Arm, Patient Position: Sitting, Cuff Size: Normal)   Pulse 81   Temp 98.6 F (37 C) (Oral)   Resp 16   Ht 5\' 1"  (1.549 m)   Wt 160 lb (72.6 kg)   LMP 02/28/1990   SpO2 95%   BMI 30.23 kg/m  Wt Readings from Last 3 Encounters:  04/20/16 160 lb (72.6 kg)  02/05/16 159 lb (72.1 kg)  12/30/15 157 lb 12.8 oz (71.6 kg)     Lab Results  Component Value Date   WBC 7.8 12/30/2015   HGB 13.4 12/30/2015   HCT 39.5 12/30/2015   PLT 216.0 12/30/2015   GLUCOSE 165 (H) 12/30/2015   CHOL 197 10/23/2015   TRIG 333.0 (H) 10/23/2015   HDL 50.90 10/23/2015   LDLDIRECT 103.0 10/23/2015   LDLCALC 116 (H) 06/21/2013   ALT 50 (H) 10/23/2015   AST 31 10/23/2015   NA 138 12/30/2015   K 4.0 12/30/2015   CL 104 12/30/2015   CREATININE 0.90 12/30/2015   BUN 11 12/30/2015   CO2 26 12/30/2015   TSH 3.59 10/23/2015       Assessment & Plan:   Problem List Items Addressed This Visit    Abnormal liver function test    Abdominal ultrasound revealed fatty liver.  Saw Dr Allyn Kenner.  Discussed diet and exercise.  Follow liver panel.        Alcohol use ([place])    Discussed with her today.  She has decreased amount (intake).  Follow.        Hypercholesterolemia    Low cholesterol diet and exercise.  Discussed with her today.  Follow lipid panel.        Renal cyst    Was being followed by urology.  See discussion.  She does agree with f/u with urology at [facility].  Has been a while.  Will schedule f/u appt.  Wants any scanning performed at [facility] Dr Clent Jacks.        Relevant Orders   Ambulatory referral to Urology   Stress    Increased stress.  Seeing her therapist.  [name] things are stable.  Follow.        Other Visit Diagnoses    Breast cancer screening    -  Primary   Had mammogram in 12/2015.  states ok.        Einar Pheasant, MD

## 2016-04-20 NOTE — Assessment & Plan Note (Signed)
Was being followed by urology.  See discussion.  She does agree with f/u with urology at [facility].  Has been a while.  Will schedule f/u appt.  Wants any scanning performed at [facility] Dr Clent Jacks.

## 2016-04-20 NOTE — Assessment & Plan Note (Signed)
Discussed with her today.  She has decreased amount (intake).  Follow.

## 2016-04-20 NOTE — Progress Notes (Signed)
Pre-visit discussion using our clinic review tool. No additional management support is needed unless otherwise documented below in the visit note.  

## 2016-04-20 NOTE — Assessment & Plan Note (Signed)
Increased stress.  Seeing her therapist.  [name] things are stable.  Follow.

## 2016-04-20 NOTE — Assessment & Plan Note (Signed)
Abdominal ultrasound revealed fatty liver.  Saw Dr Allyn Kenner.  Discussed diet and exercise.  Follow liver panel.

## 2016-04-26 ENCOUNTER — Encounter: Payer: Self-pay | Admitting: Internal Medicine

## 2016-05-02 ENCOUNTER — Other Ambulatory Visit: Payer: Self-pay | Admitting: Internal Medicine

## 2016-05-19 ENCOUNTER — Other Ambulatory Visit (INDEPENDENT_AMBULATORY_CARE_PROVIDER_SITE_OTHER): Payer: BLUE CROSS/BLUE SHIELD

## 2016-05-19 ENCOUNTER — Other Ambulatory Visit: Payer: Self-pay | Admitting: Internal Medicine

## 2016-05-19 ENCOUNTER — Telehealth: Payer: Self-pay | Admitting: *Deleted

## 2016-05-19 DIAGNOSIS — E7800 Pure hypercholesterolemia, unspecified: Secondary | ICD-10-CM

## 2016-05-19 DIAGNOSIS — R7989 Other specified abnormal findings of blood chemistry: Secondary | ICD-10-CM | POA: Diagnosis not present

## 2016-05-19 DIAGNOSIS — R945 Abnormal results of liver function studies: Secondary | ICD-10-CM

## 2016-05-19 LAB — HEPATIC FUNCTION PANEL
ALBUMIN: 4.6 g/dL (ref 3.5–5.2)
ALK PHOS: 51 U/L (ref 39–117)
ALT: 80 U/L — ABNORMAL HIGH (ref 0–35)
AST: 55 U/L — AB (ref 0–37)
BILIRUBIN TOTAL: 0.6 mg/dL (ref 0.2–1.2)
Bilirubin, Direct: 0.1 mg/dL (ref 0.0–0.3)
Total Protein: 7 g/dL (ref 6.0–8.3)

## 2016-05-19 LAB — BASIC METABOLIC PANEL
BUN: 16 mg/dL (ref 6–23)
CALCIUM: 9.6 mg/dL (ref 8.4–10.5)
CO2: 27 meq/L (ref 19–32)
CREATININE: 0.91 mg/dL (ref 0.40–1.20)
Chloride: 103 mEq/L (ref 96–112)
GFR: 66.01 mL/min (ref >60.00)
GLUCOSE: 99 mg/dL (ref 70–99)
Potassium: 4.5 mEq/L (ref 3.5–5.1)
Sodium: 140 mEq/L (ref 135–145)

## 2016-05-19 LAB — LDL CHOLESTEROL, DIRECT: Direct LDL: 112 mg/dL

## 2016-05-19 LAB — LIPID PANEL
Cholesterol: 203 mg/dL — ABNORMAL HIGH (ref 0–200)
HDL: 60.6 mg/dL (ref >39.00)
NONHDL: 141.93
TRIGLYCERIDES: 229 mg/dL — AB (ref 0.0–149.0)
Total CHOL/HDL Ratio: 3
VLDL: 45.8 mg/dL — ABNORMAL HIGH (ref 0.0–40.0)

## 2016-05-19 MED ORDER — ALPRAZOLAM 0.25 MG PO TABS: 0.2500 mg | ORAL_TABLET | Freq: Every day | ORAL | 1 refills | Status: DC | PRN

## 2016-05-19 NOTE — Telephone Encounter (Signed)
Patient has requested a medication refill for alprazolam  [facility] Pt also requested a call to discuss receiving shingles vaccine  Pt contact [phone]

## 2016-05-19 NOTE — Telephone Encounter (Signed)
rx ok'd for alprazolam #30 with one refill.  Also, please confirm with pt that she has had chicken pox and confirm if has ever had shingles.  Thanks

## 2016-05-19 NOTE — Telephone Encounter (Signed)
Medication: Alprazolam 0.25 mg  Directions: 1 po qd  Last given: 03-29-16 #30 Number refills: 1 Last o/v: 04-20-16 Follow up: 10-28-16  Patient also wanted to know if she you want her to get new shingles vaccine. She was informed that she will have to come by and pick up script for that. We will call when ready

## 2016-05-20 ENCOUNTER — Encounter: Payer: Self-pay | Admitting: Internal Medicine

## 2016-05-20 ENCOUNTER — Other Ambulatory Visit: Payer: Self-pay | Admitting: Internal Medicine

## 2016-05-20 DIAGNOSIS — R945 Abnormal results of liver function studies: Secondary | ICD-10-CM

## 2016-05-20 DIAGNOSIS — R7989 Other specified abnormal findings of blood chemistry: Secondary | ICD-10-CM

## 2016-05-20 NOTE — Telephone Encounter (Signed)
Per your last phone message she agreed to lab.  Please clarify with pt what she wants to do.  Thanks

## 2016-05-20 NOTE — Progress Notes (Signed)
Order placed for f/u liver panel.  

## 2016-05-21 ENCOUNTER — Other Ambulatory Visit: Payer: Self-pay | Admitting: Internal Medicine

## 2016-05-21 DIAGNOSIS — R7989 Other specified abnormal findings of blood chemistry: Secondary | ICD-10-CM

## 2016-05-21 DIAGNOSIS — R945 Abnormal results of liver function studies: Secondary | ICD-10-CM

## 2016-05-21 NOTE — Progress Notes (Signed)
Order placed for f/u liver function tests.  

## 2016-05-21 NOTE — Telephone Encounter (Signed)
Patient has had old shingles vaccination wanted to know if she should get new one?

## 2016-05-24 NOTE — Telephone Encounter (Signed)
My chart message sent to pt.  She also sent a my chart message regarding same question and information.

## 2016-05-25 NOTE — Telephone Encounter (Signed)
Please notify pt that I have written rx for shingrix.  Placed in your box.  Not sure if she wants to pick up or [place] to mail to her.  Regarding her appt, I still think it would be a good idea for her to touch base with Dr Blair Promise, but she just asked me if I received her message.  See previous my chart message.

## 2016-05-26 ENCOUNTER — Ambulatory Visit: Payer: BLUE CROSS/BLUE SHIELD | Admitting: Internal Medicine

## 2016-06-03 ENCOUNTER — Other Ambulatory Visit: Payer: Self-pay | Admitting: Internal Medicine

## 2016-06-12 ENCOUNTER — Other Ambulatory Visit: Payer: Self-pay | Admitting: Internal Medicine

## 2016-06-21 ENCOUNTER — Other Ambulatory Visit (INDEPENDENT_AMBULATORY_CARE_PROVIDER_SITE_OTHER): Payer: BLUE CROSS/BLUE SHIELD

## 2016-06-21 DIAGNOSIS — R945 Abnormal results of liver function studies: Secondary | ICD-10-CM

## 2016-06-21 DIAGNOSIS — R7989 Other specified abnormal findings of blood chemistry: Secondary | ICD-10-CM

## 2016-06-21 LAB — HEPATIC FUNCTION PANEL
ALK PHOS: 51 U/L (ref 39–117)
ALT: 84 U/L — ABNORMAL HIGH (ref 0–35)
AST: 55 U/L — ABNORMAL HIGH (ref 0–37)
Albumin: 4.4 g/dL (ref 3.5–5.2)
BILIRUBIN DIRECT: 0.1 mg/dL (ref 0.0–0.3)
TOTAL PROTEIN: 7 g/dL (ref 6.0–8.3)
Total Bilirubin: 0.4 mg/dL (ref 0.2–1.2)

## 2016-06-30 ENCOUNTER — Other Ambulatory Visit: Payer: BLUE CROSS/BLUE SHIELD

## 2016-07-15 ENCOUNTER — Other Ambulatory Visit: Payer: Self-pay | Admitting: Internal Medicine

## 2016-07-19 ENCOUNTER — Encounter: Payer: Self-pay | Admitting: Internal Medicine

## 2016-07-19 ENCOUNTER — Other Ambulatory Visit: Payer: Self-pay | Admitting: Internal Medicine

## 2016-07-19 NOTE — Telephone Encounter (Signed)
Medication: Xanax 0.25  Directions: 1 po qd PRN  Last given: 05/19/16 #30 Number refills: 1 rf  Last o/v: 04/20/16 Follow up: 10/28/16

## 2016-07-20 NOTE — Telephone Encounter (Signed)
Faxed to pharmacy patient informed. She would like delivered I have put not on prescription.

## 2016-07-20 NOTE — Telephone Encounter (Signed)
ok'd rx for xanax #30 with one refill.   

## 2016-08-02 ENCOUNTER — Other Ambulatory Visit: Payer: Self-pay | Admitting: Internal Medicine

## 2016-08-04 ENCOUNTER — Encounter: Payer: Self-pay | Admitting: Internal Medicine

## 2016-08-04 DIAGNOSIS — R7989 Other specified abnormal findings of blood chemistry: Secondary | ICD-10-CM

## 2016-08-04 DIAGNOSIS — E7800 Pure hypercholesterolemia, unspecified: Secondary | ICD-10-CM

## 2016-08-04 DIAGNOSIS — R945 Abnormal results of liver function studies: Secondary | ICD-10-CM

## 2016-08-04 NOTE — Telephone Encounter (Signed)
I have placed order for labs.  Just needs fasting lab appt 1-2 days before f/u appt.

## 2016-08-04 NOTE — Telephone Encounter (Signed)
If she desires not to come in for the July lab appt, then make sure is scheduled for f/u labs 1-2 days before her appt in 10/2016.

## 2016-08-19 ENCOUNTER — Other Ambulatory Visit: Payer: BLUE CROSS/BLUE SHIELD

## 2016-08-19 ENCOUNTER — Other Ambulatory Visit: Payer: Self-pay | Admitting: Internal Medicine

## 2016-08-25 ENCOUNTER — Other Ambulatory Visit: Payer: Self-pay | Admitting: Internal Medicine

## 2016-09-14 ENCOUNTER — Other Ambulatory Visit: Payer: Self-pay | Admitting: Internal Medicine

## 2016-09-15 ENCOUNTER — Other Ambulatory Visit: Payer: Self-pay | Admitting: Internal Medicine

## 2016-10-08 ENCOUNTER — Other Ambulatory Visit: Payer: Self-pay | Admitting: Internal Medicine

## 2016-10-12 ENCOUNTER — Other Ambulatory Visit: Payer: Self-pay | Admitting: Internal Medicine

## 2016-10-23 DIAGNOSIS — H6641 Suppurative otitis media, unspecified, right ear: Secondary | ICD-10-CM | POA: Diagnosis not present

## 2016-10-23 DIAGNOSIS — J019 Acute sinusitis, unspecified: Secondary | ICD-10-CM | POA: Diagnosis not present

## 2016-10-23 DIAGNOSIS — J3089 Other allergic rhinitis: Secondary | ICD-10-CM | POA: Diagnosis not present

## 2016-10-26 ENCOUNTER — Other Ambulatory Visit (INDEPENDENT_AMBULATORY_CARE_PROVIDER_SITE_OTHER): Payer: Medicare Other

## 2016-10-26 DIAGNOSIS — R945 Abnormal results of liver function studies: Secondary | ICD-10-CM

## 2016-10-26 DIAGNOSIS — E7800 Pure hypercholesterolemia, unspecified: Secondary | ICD-10-CM

## 2016-10-26 DIAGNOSIS — R7989 Other specified abnormal findings of blood chemistry: Secondary | ICD-10-CM

## 2016-10-26 LAB — LIPID PANEL
Cholesterol: 198 mg/dL (ref 0–200)
HDL: 64.2 mg/dL (ref >39.00)
NONHDL: 133.69
Total CHOL/HDL Ratio: 3
Triglycerides: 232 mg/dL — ABNORMAL HIGH (ref 0.0–149.0)
VLDL: 46.4 mg/dL — ABNORMAL HIGH (ref 0.0–40.0)

## 2016-10-26 LAB — BASIC METABOLIC PANEL
BUN: 12 mg/dL (ref 6–23)
CO2: 27 mEq/L (ref 19–32)
Calcium: 9.5 mg/dL (ref 8.4–10.5)
Chloride: 102 mEq/L (ref 96–112)
Creatinine, Ser: 0.84 mg/dL (ref 0.40–1.20)
GFR: 72.3 mL/min (ref >60.00)
Glucose, Bld: 100 mg/dL — ABNORMAL HIGH (ref 70–99)
Potassium: 4 mEq/L (ref 3.5–5.1)
Sodium: 139 mEq/L (ref 135–145)

## 2016-10-26 LAB — LDL CHOLESTEROL, DIRECT: Direct LDL: 112 mg/dL

## 2016-10-26 LAB — HEPATIC FUNCTION PANEL
ALBUMIN: 4.5 g/dL (ref 3.5–5.2)
ALK PHOS: 50 U/L (ref 39–117)
ALT: 81 U/L — ABNORMAL HIGH (ref 0–35)
AST: 54 U/L — AB (ref 0–37)
BILIRUBIN DIRECT: 0.1 mg/dL (ref 0.0–0.3)
Total Bilirubin: 0.6 mg/dL (ref 0.2–1.2)
Total Protein: 7.2 g/dL (ref 6.0–8.3)

## 2016-10-27 ENCOUNTER — Encounter: Payer: Self-pay | Admitting: Internal Medicine

## 2016-10-28 ENCOUNTER — Ambulatory Visit (INDEPENDENT_AMBULATORY_CARE_PROVIDER_SITE_OTHER): Payer: Medicare Other | Admitting: Internal Medicine

## 2016-10-28 ENCOUNTER — Encounter: Payer: Self-pay | Admitting: Internal Medicine

## 2016-10-28 VITALS — BP 122/72 | HR 95 | Temp 97.9°F | Ht 61.02 in | Wt 154.6 lb

## 2016-10-28 DIAGNOSIS — Z0000 Encounter for general adult medical examination without abnormal findings: Secondary | ICD-10-CM

## 2016-10-28 DIAGNOSIS — E7800 Pure hypercholesterolemia, unspecified: Secondary | ICD-10-CM

## 2016-10-28 DIAGNOSIS — F439 Reaction to severe stress, unspecified: Secondary | ICD-10-CM

## 2016-10-28 DIAGNOSIS — N281 Cyst of kidney, acquired: Secondary | ICD-10-CM

## 2016-10-28 DIAGNOSIS — J0100 Acute maxillary sinusitis, unspecified: Secondary | ICD-10-CM

## 2016-10-28 DIAGNOSIS — K5730 Diverticulosis of large intestine without perforation or abscess without bleeding: Secondary | ICD-10-CM

## 2016-10-28 DIAGNOSIS — Z789 Other specified health status: Secondary | ICD-10-CM

## 2016-10-28 DIAGNOSIS — R7989 Other specified abnormal findings of blood chemistry: Secondary | ICD-10-CM

## 2016-10-28 DIAGNOSIS — R945 Abnormal results of liver function studies: Secondary | ICD-10-CM | POA: Diagnosis not present

## 2016-10-28 DIAGNOSIS — Z7289 Other problems related to lifestyle: Secondary | ICD-10-CM

## 2016-10-28 MED ORDER — FLUCONAZOLE 150 MG PO TABS: 150.0000 mg | ORAL_TABLET | Freq: Once | ORAL | 0 refills | Status: AC

## 2016-10-28 NOTE — Progress Notes (Signed)
[name], female   DOB: 1950-03-16, 66 y.o.   MRN: [id number]   Subjective:    [name], female    DOB: 1950/11/24, 66 y.o.   MRN: [id number]  HPI  Patient here for her physical exam.  She reports she has been doing relatively well.  Still dealing with increased stress.  Seeing her counselor.  This helps.  She feels she is doing better.  Tries to stay active.  No chest pain.  No sob.  No acid reflux.  She does report recently being diagnosed with sinus infection.  Was evaluated at [facility].  Placed on augmentin.  Discussed adding mucinex.  Is feeling better.  Still on abx.  Had 2-3 episodes of loose stool yesterday.  No watery diarrhea.  Discussed adding probiotics.  Discussed urology f/u.  States she was told by urology that she did not need.  Declines f/u.  Also discussed f/u ultrasound - f/u for her liver.  She declines.  Has lost some weight.  Trying to watch her diet.     Past Medical History:  Diagnosis Date  . Abdominal wall hernia    secondary to large left renal cyst removal  . Atrophic vaginitis   . Colon polyps   . Diverticulosis   . Fibrocystic breast disease   . GERD (gastroesophageal reflux disease)   . Hyperlipidemia   . Postmenopausal   . Sinusitis    Past Surgical History:  Procedure Laterality Date  . ABDOMINAL HYSTERECTOMY  2005   partial  . APPENDECTOMY    . BLEPHAROPLASTY    . Bone Spur     removal - left index finger  . CATARACT EXTRACTION, BILATERAL  2000, 2005  . EXCISION MORTON'S NEUROMA  [id number]   Dr. Albertine Patricia  . HERNIA REPAIR    . lumb hernia    . RENAL CYST EXCISION    . TUBAL LIGATION     Family History  Problem Relation Age of Onset  . Cancer Father        prostate  . Lung cancer Unknown        parent  . Hypercholesterolemia Unknown        parent   Social History   Social History  . Marital status: Married    Spouse name: N/A  . Number of children: 2  . Years of education: N/A   Social  History Main Topics  . Smoking status: Former Research scientist (life sciences)  . Smokeless tobacco: Never Used  . Alcohol use 0.0 oz/week  . Drug use: No  . Sexual activity: Not Asked   Other Topics Concern  . None   Social History Narrative  . None    Outpatient Encounter Prescriptions as of 10/28/2016  Medication Sig  . ALPRAZolam (XANAX) 0.25 MG tablet TAKE 1 TABLET BY MOUTH DAILY AS NEEDED  . amoxicillin-clavulanate (AUGMENTIN) 875-125 MG tablet Take 1 tablet by mouth 2 (two) times daily.  Marland Kitchen buPROPion (WELLBUTRIN XL) 150 MG 24 hr tablet TAKE 3 TABLETS BY MOUTH DAILY  . estradiol (ESTRACE) 0.5 MG tablet TAKE 1 TABLET(0.5 MG) BY MOUTH DAILY  . fluticasone (FLONASE) 50 MCG/ACT nasal spray Place 2 sprays into both nostrils daily.  Marland Kitchen MILK THISTLE PO Take by mouth 2 (two) times daily.  . Omega-3 Fatty Acids (FISH OIL PO) Take by mouth daily.  . pantoprazole (PROTONIX) 40 MG tablet TAKE 1 TABLET(40 MG) BY MOUTH DAILY  . [EXPIRED] fluconazole (DIFLUCAN) 150 MG tablet Take 1  tablet (150 mg total) by mouth once.   No facility-administered encounter medications on file as of 10/28/2016.     Review of Systems  Constitutional: Negative for appetite change and unexpected weight change.  HENT: Positive for congestion and sinus pressure.   Eyes: Negative for pain and visual disturbance.  Respiratory: Negative for cough, chest tightness and shortness of breath.   Cardiovascular: Negative for chest pain, palpitations and leg swelling.  Gastrointestinal: Negative for abdominal pain, diarrhea, nausea and vomiting.       Some loose stool yesterday.    Genitourinary: Negative for difficulty urinating and dysuria.  Musculoskeletal: Negative for back pain and joint swelling.  Skin: Negative for color change and rash.  Neurological: Negative for dizziness, light-headedness and headaches.  Hematological: Negative for adenopathy. Does not bruise/bleed easily.  Psychiatric/Behavioral: Negative for agitation and dysphoric  mood.       Objective:    Physical Exam  Constitutional: She is oriented to person, place, and time. She appears well-developed and well-nourished. No distress.  HENT:  Nose: Nose normal.  Mouth/Throat: Oropharynx is clear and moist.  Eyes: Right eye exhibits no discharge. Left eye exhibits no discharge. No scleral icterus.  Neck: Neck supple. No thyromegaly present.  Cardiovascular: Normal rate and regular rhythm.   Pulmonary/Chest: Breath sounds normal. No accessory muscle usage. No tachypnea. No respiratory distress. She has no decreased breath sounds. She has no wheezes. She has no rhonchi. Right breast exhibits no inverted nipple, no mass, no nipple discharge and no tenderness (no axillary adenopathy). Left breast exhibits no inverted nipple, no mass, no nipple discharge and no tenderness (no axilarry adenopathy).  Abdominal: Soft. Bowel sounds are normal. There is no tenderness.  Fullness - left side.  Unchanged.    Musculoskeletal: She exhibits no edema or tenderness.  Lymphadenopathy:    She has no cervical adenopathy.  Neurological: She is alert and oriented to person, place, and time.  Skin: Skin is warm. No rash noted. No erythema.  Psychiatric: She has a normal mood and affect. Her behavior is normal.    BP 122/72 (BP Location: Left Arm, Patient Position: Sitting, Cuff Size: Normal)   Pulse 95   Temp 97.9 F (36.6 C) (Oral)   Ht 5' 1.02" (1.55 m)   Wt 154 lb 9.6 oz (70.1 kg)   LMP 02/28/1990   SpO2 95%   BMI 29.19 kg/m  Wt Readings from Last 3 Encounters:  10/28/16 154 lb 9.6 oz (70.1 kg)  04/20/16 160 lb (72.6 kg)  02/05/16 159 lb (72.1 kg)     Lab Results  Component Value Date   WBC 7.8 12/30/2015   HGB 13.4 12/30/2015   HCT 39.5 12/30/2015   PLT 216.0 12/30/2015   GLUCOSE 100 (H) 10/26/2016   CHOL 198 10/26/2016   TRIG 232.0 (H) 10/26/2016   HDL 64.20 10/26/2016   LDLDIRECT 112.0 10/26/2016   LDLCALC 116 (H) 06/21/2013   ALT 81 (H) 10/26/2016    AST 54 (H) 10/26/2016   NA 139 10/26/2016   K 4.0 10/26/2016   CL 102 10/26/2016   CREATININE 0.84 10/26/2016   BUN 12 10/26/2016   CO2 27 10/26/2016   TSH 3.59 10/23/2015       Assessment & Plan:   Problem List Items Addressed This Visit    Abnormal liver function test    Previous abdominal ultrasound revealed fatty liver.  Saw Dr Allyn Kenner.  Discussed diet and exercise.  Has lost some weight.  Follow liver panel.  Relevant Orders   CBC with Differential/Platelet   Hepatic function panel   Basic metabolic panel   Alcohol use ([place])    Discussed with her today.  Discussed the need to decrease/quit.  States she has cut down.  Follow.        Diverticulosis    Bowels have been stable.  Colonoscopy 03/07/14.  Recommended f/u colonoscopy in 5 years.        Health care maintenance    Physical today 10/28/16.  Mammogram 12/22/15 - Birads I.  She will schedule f/u mammogram.  Colonoscopy 03/07/14.        Hypercholesterolemia    Low cholesterol diet and exercise.  Has lost some weight.  Follow lipid panel.        Relevant Orders   Lipid panel   TSH   Renal cyst    Was being followed by urology.  Had referred her back last visit.  She reports was told no f/u warranted.  Discussed with her today.  Reviewed notes.  Do not see where they stated no f/u necessary.  She declines f/u at this time.        Stress    Still with increased stress as outlined.  See her therapist regularly.  Overall she feels she is doing better.  Follow.         Other Visit Diagnoses    Routine general medical examination at a health care facility    -  Primary   Acute maxillary sinusitis, recurrence not specified       On augmentin.  add mucinex.  saline nasal spray.  add probiotics.  follow.     Relevant Medications   amoxicillin-clavulanate (AUGMENTIN) 875-125 MG tablet       Einar Pheasant, MD

## 2016-10-31 ENCOUNTER — Encounter: Payer: Self-pay | Admitting: Internal Medicine

## 2016-10-31 NOTE — Assessment & Plan Note (Signed)
Still with increased stress as outlined.  See her therapist regularly.  Overall she feels she is doing better.  Follow.

## 2016-10-31 NOTE — Assessment & Plan Note (Signed)
Bowels have been stable.  Colonoscopy 03/07/14.  Recommended f/u colonoscopy in 5 years.

## 2016-10-31 NOTE — Assessment & Plan Note (Signed)
Was being followed by urology.  Had referred her back last visit.  She reports was told no f/u warranted.  Discussed with her today.  Reviewed notes.  Do not see where they stated no f/u necessary.  She declines f/u at this time.

## 2016-10-31 NOTE — Assessment & Plan Note (Signed)
Physical today 10/28/16.  Mammogram 12/22/15 - Birads I.  She will schedule f/u mammogram.  Colonoscopy 03/07/14.

## 2016-10-31 NOTE — Assessment & Plan Note (Signed)
Low cholesterol diet and exercise.  Has lost some weight.  Follow lipid panel.

## 2016-10-31 NOTE — Assessment & Plan Note (Signed)
Previous abdominal ultrasound revealed fatty liver.  Saw Dr Allyn Kenner.  Discussed diet and exercise.  Has lost some weight.  Follow liver panel.

## 2016-10-31 NOTE — Assessment & Plan Note (Addendum)
Discussed with her today.  Discussed the need to decrease/quit.  States she has cut down.  Follow.

## 2016-11-04 ENCOUNTER — Other Ambulatory Visit: Payer: Self-pay | Admitting: Internal Medicine

## 2016-11-08 ENCOUNTER — Encounter: Payer: Self-pay | Admitting: Internal Medicine

## 2016-11-08 ENCOUNTER — Other Ambulatory Visit: Payer: Self-pay | Admitting: Internal Medicine

## 2016-11-09 ENCOUNTER — Telehealth: Payer: Self-pay

## 2016-11-09 DIAGNOSIS — L03012 Cellulitis of left finger: Secondary | ICD-10-CM | POA: Diagnosis not present

## 2016-11-09 NOTE — Telephone Encounter (Signed)
If red, infected and oozing, needs to be evaluated.

## 2016-11-09 NOTE — Telephone Encounter (Signed)
Patient sent MyChart message saying she thinks she has infected finger. She had a hang nail and she went and got her nails done last Thursday and now her right middle finger is red and swollen and oozes white drainage right along the side of fingernail. Patient has been using tetre oil and wrapping it with band aid. She says she thinks it is better today but not healing.

## 2016-11-09 NOTE — Telephone Encounter (Signed)
Patient stated she would go to [facility] today.

## 2016-11-15 DIAGNOSIS — Z23 Encounter for immunization: Secondary | ICD-10-CM | POA: Diagnosis not present

## 2016-11-17 ENCOUNTER — Other Ambulatory Visit: Payer: Self-pay | Admitting: Internal Medicine

## 2016-11-17 MED ORDER — ALPRAZOLAM 0.25 MG PO TABS: 0.2500 mg | ORAL_TABLET | Freq: Every day | ORAL | 1 refills | Status: DC | PRN

## 2016-11-17 NOTE — Telephone Encounter (Signed)
Pt called requesting a refill on her ALPRAZolam (XANAX) 0.25 MG tablet. Please advise, thank you!  Call pt @ [phone]  Pharmacy - [address] - [place], [place] [address]

## 2016-11-17 NOTE — Telephone Encounter (Signed)
Last OV 10/28/16 Next OV 04/27/17 Last refill 09/15/2016 with 1 refill.

## 2016-11-18 ENCOUNTER — Other Ambulatory Visit: Payer: Self-pay | Admitting: Internal Medicine

## 2016-11-29 ENCOUNTER — Encounter: Payer: Self-pay | Admitting: Internal Medicine

## 2016-11-29 MED ORDER — SCOPOLAMINE 1 MG/3DAYS TD PT72: 1.0000 | MEDICATED_PATCH | TRANSDERMAL | 0 refills | Status: DC

## 2016-11-29 NOTE — Telephone Encounter (Signed)
rx sent in for scopolamine patches #10 with no refill.

## 2016-12-03 ENCOUNTER — Other Ambulatory Visit: Payer: Self-pay | Admitting: Internal Medicine

## 2016-12-06 ENCOUNTER — Other Ambulatory Visit: Payer: Self-pay | Admitting: Internal Medicine

## 2016-12-28 ENCOUNTER — Other Ambulatory Visit: Payer: Self-pay | Admitting: Internal Medicine

## 2016-12-30 ENCOUNTER — Other Ambulatory Visit: Payer: Self-pay | Admitting: Internal Medicine

## 2017-01-03 ENCOUNTER — Other Ambulatory Visit: Payer: Self-pay | Admitting: Internal Medicine

## 2017-01-04 DIAGNOSIS — Z1231 Encounter for screening mammogram for malignant neoplasm of breast: Secondary | ICD-10-CM | POA: Diagnosis not present

## 2017-01-16 ENCOUNTER — Encounter: Payer: Self-pay | Admitting: Internal Medicine

## 2017-01-18 DIAGNOSIS — L821 Other seborrheic keratosis: Secondary | ICD-10-CM | POA: Diagnosis not present

## 2017-01-19 ENCOUNTER — Encounter: Payer: Self-pay | Admitting: Internal Medicine

## 2017-01-19 ENCOUNTER — Other Ambulatory Visit: Payer: Self-pay | Admitting: Internal Medicine

## 2017-01-19 MED ORDER — ALPRAZOLAM 0.25 MG PO TABS: 0.2500 mg | ORAL_TABLET | Freq: Every day | ORAL | 1 refills | Status: DC | PRN

## 2017-01-19 NOTE — Telephone Encounter (Signed)
Last OV 9/18 last refill 10/18 for 30 with one refill ok to refill alprazolam?

## 2017-01-19 NOTE — Telephone Encounter (Signed)
ok'd rx for xanax #30 with one refill.  rx signed and placed on your desk.   

## 2017-01-27 ENCOUNTER — Other Ambulatory Visit: Payer: Self-pay | Admitting: Internal Medicine

## 2017-02-04 ENCOUNTER — Other Ambulatory Visit: Payer: Self-pay | Admitting: Internal Medicine

## 2017-03-10 ENCOUNTER — Encounter: Payer: Self-pay | Admitting: Internal Medicine

## 2017-03-11 MED ORDER — ALPRAZOLAM 0.25 MG PO TABS: 0.2500 mg | ORAL_TABLET | Freq: Every day | ORAL | 0 refills | Status: DC | PRN

## 2017-03-11 NOTE — Telephone Encounter (Signed)
Last OV 10/28/2016 Next OV 04/27/2017 Last refill 01/19/2017 with 1 refill.   Ok to fill early due to patient going out of town?

## 2017-03-11 NOTE — Telephone Encounter (Signed)
Pt called in to follow up on refill request. Pt would like to know if someone could call her when filled?   Please assist further.    Pharmacy: [facility]

## 2017-03-11 NOTE — Telephone Encounter (Signed)
rx xanax ok'd #30 with no refills.

## 2017-03-12 NOTE — Telephone Encounter (Signed)
Please change her pharmacy to [facility].

## 2017-03-17 ENCOUNTER — Other Ambulatory Visit: Payer: Self-pay

## 2017-03-17 MED ORDER — BUPROPION HCL ER (XL) 150 MG PO TB24: 450.0000 mg | ORAL_TABLET | Freq: Every day | ORAL | 0 refills | Status: DC

## 2017-03-17 MED ORDER — FLUTICASONE PROPIONATE 50 MCG/ACT NA SUSP: 2.0000 | Freq: Every day | NASAL | 2 refills | Status: DC

## 2017-03-17 MED ORDER — ESTRADIOL 0.5 MG PO TABS: ORAL_TABLET | ORAL | 0 refills | Status: DC

## 2017-03-17 MED ORDER — PANTOPRAZOLE SODIUM 40 MG PO TBEC: DELAYED_RELEASE_TABLET | ORAL | 0 refills | Status: DC

## 2017-03-28 DIAGNOSIS — L821 Other seborrheic keratosis: Secondary | ICD-10-CM | POA: Diagnosis not present

## 2017-03-28 DIAGNOSIS — L738 Other specified follicular disorders: Secondary | ICD-10-CM | POA: Diagnosis not present

## 2017-04-01 ENCOUNTER — Other Ambulatory Visit: Payer: Self-pay | Admitting: Internal Medicine

## 2017-04-06 DIAGNOSIS — M19041 Primary osteoarthritis, right hand: Secondary | ICD-10-CM | POA: Diagnosis not present

## 2017-04-12 ENCOUNTER — Encounter: Payer: Self-pay | Admitting: Internal Medicine

## 2017-04-12 ENCOUNTER — Other Ambulatory Visit: Payer: Self-pay | Admitting: *Deleted

## 2017-04-12 MED ORDER — ALPRAZOLAM 0.25 MG PO TABS: 0.2500 mg | ORAL_TABLET | Freq: Every day | ORAL | 1 refills | Status: DC | PRN

## 2017-04-12 NOTE — Telephone Encounter (Signed)
rx ok'd for xanax #30 with one refill  

## 2017-04-12 NOTE — Telephone Encounter (Signed)
Rx faxed to [facility]

## 2017-04-12 NOTE — Telephone Encounter (Signed)
Pharmacy has faxed this request 4 times now. Pt now uses [facility] since [facility] has closed. Needs new Rx for Xanax.

## 2017-04-13 ENCOUNTER — Encounter: Payer: Self-pay | Admitting: Internal Medicine

## 2017-04-14 ENCOUNTER — Other Ambulatory Visit (INDEPENDENT_AMBULATORY_CARE_PROVIDER_SITE_OTHER): Payer: Medicare Other

## 2017-04-14 DIAGNOSIS — E7800 Pure hypercholesterolemia, unspecified: Secondary | ICD-10-CM | POA: Diagnosis not present

## 2017-04-14 DIAGNOSIS — R945 Abnormal results of liver function studies: Secondary | ICD-10-CM | POA: Diagnosis not present

## 2017-04-14 DIAGNOSIS — R7989 Other specified abnormal findings of blood chemistry: Secondary | ICD-10-CM

## 2017-04-14 LAB — CBC WITH DIFFERENTIAL/PLATELET
BASOS ABS: 0.1 10*3/uL (ref 0.0–0.1)
Basophils Relative: 0.9 % (ref 0.0–3.0)
EOS ABS: 0.5 10*3/uL (ref 0.0–0.7)
Eosinophils Relative: 6.7 % — ABNORMAL HIGH (ref 0.0–5.0)
HEMATOCRIT: 43.3 % (ref 36.0–46.0)
HEMOGLOBIN: 14.7 g/dL (ref 12.0–15.0)
LYMPHS PCT: 25.8 % (ref 12.0–46.0)
Lymphs Abs: 1.9 10*3/uL (ref 0.7–4.0)
MCHC: 33.9 g/dL (ref 30.0–36.0)
MCV: 100 fl (ref 78.0–100.0)
MONOS PCT: 7.3 % (ref 3.0–12.0)
Monocytes Absolute: 0.5 10*3/uL (ref 0.1–1.0)
Neutro Abs: 4.4 10*3/uL (ref 1.4–7.7)
Neutrophils Relative %: 59.3 % (ref 43.0–77.0)
Platelets: 231 10*3/uL (ref 150.0–400.0)
RBC: 4.33 Mil/uL (ref 3.87–5.11)
RDW: 13 % (ref 11.5–15.5)
WBC: 7.4 10*3/uL (ref 4.0–10.5)

## 2017-04-14 LAB — BASIC METABOLIC PANEL
BUN: 15 mg/dL (ref 6–23)
CHLORIDE: 104 meq/L (ref 96–112)
CO2: 26 meq/L (ref 19–32)
Calcium: 9.6 mg/dL (ref 8.4–10.5)
Creatinine, Ser: 0.83 mg/dL (ref 0.40–1.20)
GFR: 73.2 mL/min (ref >60.00)
Glucose, Bld: 100 mg/dL — ABNORMAL HIGH (ref 70–99)
POTASSIUM: 4.1 meq/L (ref 3.5–5.1)
Sodium: 138 mEq/L (ref 135–145)

## 2017-04-14 LAB — HEPATIC FUNCTION PANEL
ALT: 64 U/L — AB (ref 0–35)
AST: 44 U/L — ABNORMAL HIGH (ref 0–37)
Albumin: 4.4 g/dL (ref 3.5–5.2)
Alkaline Phosphatase: 49 U/L (ref 39–117)
Bilirubin, Direct: 0.1 mg/dL (ref 0.0–0.3)
TOTAL PROTEIN: 7 g/dL (ref 6.0–8.3)
Total Bilirubin: 0.6 mg/dL (ref 0.2–1.2)

## 2017-04-14 LAB — LDL CHOLESTEROL, DIRECT: Direct LDL: 113 mg/dL

## 2017-04-14 LAB — TSH: TSH: 3.57 u[IU]/mL (ref 0.35–4.50)

## 2017-04-14 LAB — LIPID PANEL
CHOL/HDL RATIO: 3
Cholesterol: 198 mg/dL (ref 0–200)
HDL: 71.4 mg/dL (ref >39.00)
NonHDL: 126.94
Triglycerides: 205 mg/dL — ABNORMAL HIGH (ref 0.0–149.0)
VLDL: 41 mg/dL — AB (ref 0.0–40.0)

## 2017-04-15 ENCOUNTER — Encounter: Payer: Self-pay | Admitting: Internal Medicine

## 2017-04-19 DIAGNOSIS — Z961 Presence of intraocular lens: Secondary | ICD-10-CM | POA: Diagnosis not present

## 2017-04-19 DIAGNOSIS — H04123 Dry eye syndrome of bilateral lacrimal glands: Secondary | ICD-10-CM | POA: Diagnosis not present

## 2017-04-21 DIAGNOSIS — G8929 Other chronic pain: Secondary | ICD-10-CM | POA: Diagnosis not present

## 2017-04-21 DIAGNOSIS — M1712 Unilateral primary osteoarthritis, left knee: Secondary | ICD-10-CM | POA: Diagnosis not present

## 2017-04-21 DIAGNOSIS — M2352 Chronic instability of knee, left knee: Secondary | ICD-10-CM | POA: Diagnosis not present

## 2017-04-21 DIAGNOSIS — M25562 Pain in left knee: Secondary | ICD-10-CM | POA: Diagnosis not present

## 2017-04-22 ENCOUNTER — Other Ambulatory Visit: Payer: Medicare Other

## 2017-04-27 ENCOUNTER — Encounter: Payer: Self-pay | Admitting: Internal Medicine

## 2017-04-27 ENCOUNTER — Ambulatory Visit: Payer: Medicare Other | Admitting: Internal Medicine

## 2017-04-27 VITALS — BP 124/76 | HR 70 | Temp 98.0°F | Resp 16 | Wt 158.2 lb

## 2017-04-27 DIAGNOSIS — E2839 Other primary ovarian failure: Secondary | ICD-10-CM | POA: Diagnosis not present

## 2017-04-27 DIAGNOSIS — R739 Hyperglycemia, unspecified: Secondary | ICD-10-CM

## 2017-04-27 DIAGNOSIS — N281 Cyst of kidney, acquired: Secondary | ICD-10-CM

## 2017-04-27 DIAGNOSIS — Z7289 Other problems related to lifestyle: Secondary | ICD-10-CM

## 2017-04-27 DIAGNOSIS — E7800 Pure hypercholesterolemia, unspecified: Secondary | ICD-10-CM | POA: Diagnosis not present

## 2017-04-27 DIAGNOSIS — F439 Reaction to severe stress, unspecified: Secondary | ICD-10-CM

## 2017-04-27 DIAGNOSIS — R945 Abnormal results of liver function studies: Secondary | ICD-10-CM

## 2017-04-27 DIAGNOSIS — R7989 Other specified abnormal findings of blood chemistry: Secondary | ICD-10-CM

## 2017-04-27 DIAGNOSIS — Z789 Other specified health status: Secondary | ICD-10-CM | POA: Diagnosis not present

## 2017-04-27 NOTE — Progress Notes (Signed)
[name], female   DOB: 1950-03-15, 67 y.o.   MRN: [id number]   Subjective:    [name], female    DOB: 07-30-1950, 67 y.o.   MRN: [id number]  HPI  Patient here for a scheduled follow up.  Has been having left knee pain.  Saw ortho.  Discussed MRI.  Is better now.  Having some problems with two of her fingers.  Planning to see ortho (Dr Rob Hickman - [phone]).   Started taking tumeric.  States she feels better.  No chest pain.  No sob.  No acid reflux.  No abdominal pain.  Bowels moving.  No urine change. Stress is better.  Seeing her counselor.  Doing well.     Past Medical History:  Diagnosis Date  . Abdominal wall hernia    secondary to large left renal cyst removal  . Atrophic vaginitis   . Colon polyps   . Diverticulosis   . Fibrocystic breast disease   . GERD (gastroesophageal reflux disease)   . Hyperlipidemia   . Postmenopausal   . Sinusitis    Past Surgical History:  Procedure Laterality Date  . ABDOMINAL HYSTERECTOMY  2005   partial  . APPENDECTOMY    . BLEPHAROPLASTY    . Bone Spur     removal - left index finger  . CATARACT EXTRACTION, BILATERAL  2000, 2005  . EXCISION MORTON'S NEUROMA  [id number]   Dr. Albertine Patricia  . HERNIA REPAIR    . lumb hernia    . RENAL CYST EXCISION    . TUBAL LIGATION     Family History  Problem Relation Age of Onset  . Cancer Father        prostate  . Lung cancer Unknown        parent  . Hypercholesterolemia Unknown        parent   Social History   Socioeconomic History  . Marital status: Married    Spouse name: Not on file  . Number of children: 2  . Years of education: Not on file  . Highest education level: Not on file  Occupational History  . Not on file  Social Needs  . Financial resource strain: Not on file  . Food insecurity:    Worry: Not on file    Inability: Not on file  . Transportation needs:    Medical: Not on file    Non-medical: Not on file  Tobacco Use  .  Smoking status: Former Research scientist (life sciences)  . Smokeless tobacco: Never Used  Substance and Sexual Activity  . Alcohol use: Yes    Alcohol/week: 0.0 oz  . Drug use: No  . Sexual activity: Not on file  Lifestyle  . Physical activity:    Days per week: Not on file    Minutes per session: Not on file  . Stress: Not on file  Relationships  . Social connections:    Talks on phone: Not on file    Gets together: Not on file    Attends religious service: Not on file    Active member of club or organization: Not on file    Attends meetings of clubs or organizations: Not on file    Relationship status: Not on file  Other Topics Concern  . Not on file  Social History Narrative  . Not on file    Outpatient Encounter Medications as of 04/27/2017  Medication Sig  . ALPRAZolam (XANAX) 0.25 MG tablet Take 1  tablet (0.25 mg total) by mouth daily as needed.  Marland Kitchen buPROPion (WELLBUTRIN XL) 150 MG 24 hr tablet Take 3 tablets (450 mg total) by mouth daily.  Marland Kitchen estradiol (ESTRACE) 0.5 MG tablet TAKE 1 TABLET(0.5 MG) BY MOUTH DAILY  . fluticasone (FLONASE) 50 MCG/ACT nasal spray Place 2 sprays into both nostrils daily.  Marland Kitchen MILK THISTLE PO Take by mouth 2 (two) times daily.  . Omega-3 Fatty Acids (FISH OIL PO) Take by mouth daily.  . pantoprazole (PROTONIX) 40 MG tablet TAKE 1 TABLET(40 MG) BY MOUTH DAILY  . Turmeric 400 MG CAPS Take by mouth.  . [DISCONTINUED] amoxicillin-clavulanate (AUGMENTIN) 875-125 MG tablet Take 1 tablet by mouth 2 (two) times daily.  . [DISCONTINUED] scopolamine (TRANSDERM-SCOP, 1.5 MG,) 1 MG/3DAYS Place 1 patch (1.5 mg total) onto the skin every 3 (three) days.   No facility-administered encounter medications on file as of 04/27/2017.     Review of Systems  Constitutional: Negative for appetite change and unexpected weight change.  HENT: Negative for congestion and sinus pressure.   Respiratory: Negative for cough, chest tightness and shortness of breath.   Cardiovascular: Negative for  chest pain, palpitations and leg swelling.  Gastrointestinal: Negative for abdominal pain, diarrhea, nausea and vomiting.  Genitourinary: Negative for difficulty urinating and dysuria.  Musculoskeletal: Negative for joint swelling and myalgias.  Skin: Negative for color change and rash.  Neurological: Negative for dizziness, light-headedness and headaches.  Psychiatric/Behavioral: Negative for agitation and dysphoric mood.       Objective:     Blood pressure rechecked by me:  128/80  Physical Exam  Constitutional: She appears well-developed and well-nourished. No distress.  HENT:  Nose: Nose normal.  Mouth/Throat: Oropharynx is clear and moist.  Neck: Neck supple. No thyromegaly present.  Cardiovascular: Normal rate and regular rhythm.  Pulmonary/Chest: Breath sounds normal. No respiratory distress. She has no wheezes.  Abdominal: Soft. Bowel sounds are normal. There is no tenderness.  Musculoskeletal: She exhibits no edema or tenderness.  Lymphadenopathy:    She has no cervical adenopathy.  Skin: No rash noted. No erythema.  Psychiatric: She has a normal mood and affect. Her behavior is normal.    BP 124/76 (BP Location: Left Arm, Patient Position: Sitting, Cuff Size: Normal)   Pulse 70   Temp 98 F (36.7 C) (Oral)   Resp 16   Wt 158 lb 3.2 oz (71.8 kg)   LMP 02/28/1990   SpO2 97%   BMI 29.87 kg/m  Wt Readings from Last 3 Encounters:  04/27/17 158 lb 3.2 oz (71.8 kg)  10/28/16 154 lb 9.6 oz (70.1 kg)  04/20/16 160 lb (72.6 kg)     Lab Results  Component Value Date   WBC 7.4 04/14/2017   HGB 14.7 04/14/2017   HCT 43.3 04/14/2017   PLT 231.0 04/14/2017   GLUCOSE 100 (H) 04/14/2017   CHOL 198 04/14/2017   TRIG 205.0 (H) 04/14/2017   HDL 71.40 04/14/2017   LDLDIRECT 113.0 04/14/2017   LDLCALC 116 (H) 06/21/2013   ALT 64 (H) 04/14/2017   AST 44 (H) 04/14/2017   NA 138 04/14/2017   K 4.1 04/14/2017   CL 104 04/14/2017   CREATININE 0.83 04/14/2017   BUN 15  04/14/2017   CO2 26 04/14/2017   TSH 3.57 04/14/2017       Assessment & Plan:   Problem List Items Addressed This Visit    Abnormal liver function test    Previous abdominal ultrasound revealed fatty liver.  Saw Dr  Medhoff.  Discussed diet and exercise.  Follow liver panel.   Lab Results  Component Value Date   ALT 64 (H) 04/14/2017   AST 44 (H) 04/14/2017   ALKPHOS 49 04/14/2017   BILITOT 0.6 04/14/2017        Relevant Orders   Hepatic function panel   Alcohol use ([place])    Discussed with her today.  She has cut down significantly.  Follow liver panel.        Hypercholesterolemia    Low cholesterol diet and exercise.  Follow lipid panel.        Relevant Orders   Lipid panel   Renal cyst    Was followed by urology.  She reports no f/u warranted.  She has declined further f/up.        Stress    Stress is better.  Seeing her counselor.  Follow.         Other Visit Diagnoses    Estrogen deficiency    -  Primary   Discussed trial of tapering off estrogen.  gradual taper.  follow.     Hyperglycemia       Relevant Orders   Basic metabolic panel   Hemoglobin A1c       Einar Pheasant, MD

## 2017-04-30 ENCOUNTER — Encounter: Payer: Self-pay | Admitting: Internal Medicine

## 2017-04-30 NOTE — Assessment & Plan Note (Signed)
Stress is better.  Seeing her counselor.  Follow.

## 2017-04-30 NOTE — Assessment & Plan Note (Signed)
Low cholesterol diet and exercise.  Follow lipid panel.   

## 2017-04-30 NOTE — Assessment & Plan Note (Signed)
Previous abdominal ultrasound revealed fatty liver.  Saw Dr Allyn Kenner.  Discussed diet and exercise.  Follow liver panel.   Lab Results  Component Value Date   ALT 64 (H) 04/14/2017   AST 44 (H) 04/14/2017   ALKPHOS 49 04/14/2017   BILITOT 0.6 04/14/2017

## 2017-04-30 NOTE — Assessment & Plan Note (Signed)
Discussed with her today.  She has cut down significantly.  Follow liver panel.

## 2017-04-30 NOTE — Assessment & Plan Note (Signed)
Was followed by urology.  She reports no f/u warranted.  She has declined further f/up.

## 2017-05-04 DIAGNOSIS — Z01818 Encounter for other preprocedural examination: Secondary | ICD-10-CM | POA: Diagnosis not present

## 2017-05-04 DIAGNOSIS — M19041 Primary osteoarthritis, right hand: Secondary | ICD-10-CM | POA: Diagnosis not present

## 2017-05-17 ENCOUNTER — Other Ambulatory Visit: Payer: Self-pay | Admitting: Internal Medicine

## 2017-05-17 DIAGNOSIS — Z7951 Long term (current) use of inhaled steroids: Secondary | ICD-10-CM | POA: Diagnosis not present

## 2017-05-17 DIAGNOSIS — Z87891 Personal history of nicotine dependence: Secondary | ICD-10-CM | POA: Diagnosis not present

## 2017-05-17 DIAGNOSIS — Z881 Allergy status to other antibiotic agents status: Secondary | ICD-10-CM | POA: Diagnosis not present

## 2017-05-17 DIAGNOSIS — K219 Gastro-esophageal reflux disease without esophagitis: Secondary | ICD-10-CM | POA: Diagnosis not present

## 2017-05-17 DIAGNOSIS — M19041 Primary osteoarthritis, right hand: Secondary | ICD-10-CM | POA: Diagnosis not present

## 2017-05-17 DIAGNOSIS — F329 Major depressive disorder, single episode, unspecified: Secondary | ICD-10-CM | POA: Diagnosis not present

## 2017-05-19 ENCOUNTER — Encounter: Payer: Self-pay | Admitting: Internal Medicine

## 2017-05-20 NOTE — Telephone Encounter (Signed)
LMTCB

## 2017-05-23 DIAGNOSIS — M19041 Primary osteoarthritis, right hand: Secondary | ICD-10-CM | POA: Diagnosis not present

## 2017-06-01 ENCOUNTER — Encounter: Payer: Self-pay | Admitting: Internal Medicine

## 2017-06-01 DIAGNOSIS — M19041 Primary osteoarthritis, right hand: Secondary | ICD-10-CM | POA: Diagnosis not present

## 2017-06-02 NOTE — Telephone Encounter (Signed)
Called pt.  She just wanted me to be aware of Dr Tonette Bihari death.  Also had questions about how to get her records from him

## 2017-06-02 NOTE — Telephone Encounter (Signed)
Called patient to see what this was regarding. She stated that Dr. [name] has died and she would like to speak to you personally.

## 2017-06-03 ENCOUNTER — Other Ambulatory Visit: Payer: Self-pay | Admitting: Internal Medicine

## 2017-06-14 ENCOUNTER — Encounter: Payer: Self-pay | Admitting: Internal Medicine

## 2017-06-15 ENCOUNTER — Other Ambulatory Visit: Payer: Self-pay

## 2017-06-15 ENCOUNTER — Other Ambulatory Visit: Payer: Self-pay | Admitting: Internal Medicine

## 2017-06-15 MED ORDER — ALPRAZOLAM 0.25 MG PO TABS: 0.2500 mg | ORAL_TABLET | Freq: Every day | ORAL | 1 refills | Status: DC | PRN

## 2017-06-15 NOTE — Telephone Encounter (Signed)
Last OV 04/27/17 Next OV 10/31/17 Last refill 04/12/17

## 2017-06-17 ENCOUNTER — Other Ambulatory Visit: Payer: Self-pay | Admitting: Internal Medicine

## 2017-06-17 ENCOUNTER — Encounter: Payer: Self-pay | Admitting: Internal Medicine

## 2017-06-17 NOTE — Telephone Encounter (Signed)
Notified patient script called to pharmacy and verified with pharmacy script received.

## 2017-06-17 NOTE — Telephone Encounter (Signed)
Copied from [place] [phone]. Topic: Quick Communication - See Telephone Encounter >> Jun 17, 2017  3:29 PM Rutherford Nail, NT wrote: CRM for notification. See Telephone encounter for: 06/17/17. Kennyth Lose with [facility] states that they have not received to prescription for ALPRAZolam (XANAX) 0.25 MG tablet. She states that the patient was told that the medication was sent to the pharmacy on 06/15/17. On medication list in the chart, it shows that it was printed. Please advise.

## 2017-06-22 ENCOUNTER — Other Ambulatory Visit: Payer: Self-pay | Admitting: Internal Medicine

## 2017-06-29 DIAGNOSIS — M19041 Primary osteoarthritis, right hand: Secondary | ICD-10-CM | POA: Diagnosis not present

## 2017-07-11 ENCOUNTER — Other Ambulatory Visit: Payer: Self-pay | Admitting: Internal Medicine

## 2017-07-26 ENCOUNTER — Encounter: Payer: Self-pay | Admitting: Internal Medicine

## 2017-08-03 ENCOUNTER — Encounter: Payer: Self-pay | Admitting: Internal Medicine

## 2017-08-04 MED ORDER — SCOPOLAMINE 1 MG/3DAYS TD PT72: 1.0000 | MEDICATED_PATCH | TRANSDERMAL | 0 refills | Status: DC

## 2017-08-04 NOTE — Telephone Encounter (Signed)
Spoke with patient last week via My Chart to let her know that you were out of the office. Patient is requesting scopalomine patches for motion sickness and something for sleep while she is on her trip to [place] for 2 weeks. Patient leaves on July 23.

## 2017-08-04 NOTE — Telephone Encounter (Signed)
rx ok'd for scopolamine patches.  Sent to [facility].  Regarding sleep, she has xanax if needed.  Also can start melatonin 3mg  take 2 hours before bed.  This will help to regulate her natural sleep cycle.  Agree with travel clinic recommendations for vaccine.

## 2017-08-05 NOTE — Telephone Encounter (Signed)
Advised patient about melatonin and her xanax. She stated she would do that. Will need refill before trip but not time yet.

## 2017-08-15 ENCOUNTER — Encounter: Payer: Self-pay | Admitting: Internal Medicine

## 2017-08-15 MED ORDER — ALPRAZOLAM 0.25 MG PO TABS: 0.2500 mg | ORAL_TABLET | Freq: Every day | ORAL | 1 refills | Status: DC | PRN

## 2017-08-15 NOTE — Telephone Encounter (Signed)
ok'd rx for alprazolam #30 with 1 refill.  rx signed and placed in box.

## 2017-08-22 ENCOUNTER — Other Ambulatory Visit: Payer: Self-pay | Admitting: Internal Medicine

## 2017-09-19 ENCOUNTER — Other Ambulatory Visit: Payer: Self-pay | Admitting: Internal Medicine

## 2017-09-28 DIAGNOSIS — L578 Other skin changes due to chronic exposure to nonionizing radiation: Secondary | ICD-10-CM | POA: Diagnosis not present

## 2017-09-28 DIAGNOSIS — Z85820 Personal history of malignant melanoma of skin: Secondary | ICD-10-CM | POA: Diagnosis not present

## 2017-09-28 DIAGNOSIS — Z872 Personal history of diseases of the skin and subcutaneous tissue: Secondary | ICD-10-CM | POA: Diagnosis not present

## 2017-09-28 DIAGNOSIS — Z86018 Personal history of other benign neoplasm: Secondary | ICD-10-CM | POA: Diagnosis not present

## 2017-09-30 ENCOUNTER — Other Ambulatory Visit: Payer: Self-pay | Admitting: Internal Medicine

## 2017-10-08 ENCOUNTER — Other Ambulatory Visit: Payer: Self-pay | Admitting: Internal Medicine

## 2017-10-18 ENCOUNTER — Encounter: Payer: Self-pay | Admitting: Internal Medicine

## 2017-10-19 MED ORDER — ALPRAZOLAM 0.25 MG PO TABS: 0.2500 mg | ORAL_TABLET | Freq: Every day | ORAL | 1 refills | Status: DC | PRN

## 2017-10-19 NOTE — Telephone Encounter (Signed)
I have sent in rx for alprazolam #30 with 1 refill.  Was not sure if you had notified her about lab appt change.

## 2017-10-19 NOTE — Telephone Encounter (Signed)
Patient is aware 

## 2017-10-19 NOTE — Telephone Encounter (Signed)
I have moved her lab appt.   Ok to refill xanax?  Last OV 04/27/2017 Next OV 10/31/2017 Last refill 08/15/2017

## 2017-10-21 ENCOUNTER — Other Ambulatory Visit: Payer: Self-pay | Admitting: Internal Medicine

## 2017-10-27 ENCOUNTER — Other Ambulatory Visit (INDEPENDENT_AMBULATORY_CARE_PROVIDER_SITE_OTHER): Payer: Medicare Other

## 2017-10-27 DIAGNOSIS — E7800 Pure hypercholesterolemia, unspecified: Secondary | ICD-10-CM | POA: Diagnosis not present

## 2017-10-27 DIAGNOSIS — R945 Abnormal results of liver function studies: Secondary | ICD-10-CM | POA: Diagnosis not present

## 2017-10-27 DIAGNOSIS — R7989 Other specified abnormal findings of blood chemistry: Secondary | ICD-10-CM

## 2017-10-27 DIAGNOSIS — R739 Hyperglycemia, unspecified: Secondary | ICD-10-CM

## 2017-10-27 LAB — BASIC METABOLIC PANEL
BUN: 11 mg/dL (ref 6–23)
CALCIUM: 9.2 mg/dL (ref 8.4–10.5)
CO2: 27 mEq/L (ref 19–32)
Chloride: 104 mEq/L (ref 96–112)
Creatinine, Ser: 0.92 mg/dL (ref 0.40–1.20)
GFR: 64.9 mL/min (ref >60.00)
GLUCOSE: 106 mg/dL — AB (ref 70–99)
POTASSIUM: 4.2 meq/L (ref 3.5–5.1)
SODIUM: 140 meq/L (ref 135–145)

## 2017-10-27 LAB — HEPATIC FUNCTION PANEL
ALT: 45 U/L — ABNORMAL HIGH (ref 0–35)
AST: 31 U/L (ref 0–37)
Albumin: 4.1 g/dL (ref 3.5–5.2)
Alkaline Phosphatase: 50 U/L (ref 39–117)
BILIRUBIN TOTAL: 0.5 mg/dL (ref 0.2–1.2)
Bilirubin, Direct: 0.1 mg/dL (ref 0.0–0.3)
Total Protein: 6.7 g/dL (ref 6.0–8.3)

## 2017-10-27 LAB — LIPID PANEL
Cholesterol: 181 mg/dL (ref 0–200)
HDL: 44.4 mg/dL (ref >39.00)
NonHDL: 136.78
TRIGLYCERIDES: 276 mg/dL — AB (ref 0.0–149.0)
Total CHOL/HDL Ratio: 4
VLDL: 55.2 mg/dL — AB (ref 0.0–40.0)

## 2017-10-27 LAB — LDL CHOLESTEROL, DIRECT: LDL DIRECT: 100 mg/dL

## 2017-10-27 LAB — HEMOGLOBIN A1C: Hgb A1c MFr Bld: 5.6 % (ref 4.6–6.5)

## 2017-10-28 ENCOUNTER — Other Ambulatory Visit: Payer: Medicare Other

## 2017-10-31 ENCOUNTER — Encounter: Payer: Self-pay | Admitting: Internal Medicine

## 2017-10-31 ENCOUNTER — Ambulatory Visit (INDEPENDENT_AMBULATORY_CARE_PROVIDER_SITE_OTHER): Payer: Medicare Other | Admitting: Internal Medicine

## 2017-10-31 VITALS — BP 122/78 | HR 79 | Temp 97.9°F | Resp 18 | Ht 61.0 in | Wt 159.6 lb

## 2017-10-31 DIAGNOSIS — F439 Reaction to severe stress, unspecified: Secondary | ICD-10-CM

## 2017-10-31 DIAGNOSIS — Z23 Encounter for immunization: Secondary | ICD-10-CM | POA: Diagnosis not present

## 2017-10-31 DIAGNOSIS — N281 Cyst of kidney, acquired: Secondary | ICD-10-CM

## 2017-10-31 DIAGNOSIS — Z789 Other specified health status: Secondary | ICD-10-CM

## 2017-10-31 DIAGNOSIS — R945 Abnormal results of liver function studies: Secondary | ICD-10-CM

## 2017-10-31 DIAGNOSIS — E7800 Pure hypercholesterolemia, unspecified: Secondary | ICD-10-CM | POA: Diagnosis not present

## 2017-10-31 DIAGNOSIS — R739 Hyperglycemia, unspecified: Secondary | ICD-10-CM

## 2017-10-31 DIAGNOSIS — R7989 Other specified abnormal findings of blood chemistry: Secondary | ICD-10-CM

## 2017-10-31 DIAGNOSIS — Z0000 Encounter for general adult medical examination without abnormal findings: Secondary | ICD-10-CM

## 2017-10-31 DIAGNOSIS — Z7289 Other problems related to lifestyle: Secondary | ICD-10-CM

## 2017-10-31 NOTE — Progress Notes (Signed)
[name], female   DOB: 03/01/1950, 67 y.o.   MRN: [id number]   Subjective:    [name], female    DOB: 16-Dec-1950, 66 y.o.   MRN: [id number]  HPI  Patient here for her physical exam.  She reports she is doing better.  Stress better.  Overall she feels she is handling things relatively well.  Still taking xanax.  Tries to stay active.  No chest pain.  No sob.  No acid reflux.  No abdominal pain.  Bowels moving.  No urine change.     Past Medical History:  Diagnosis Date  . Abdominal wall hernia    secondary to large left renal cyst removal  . Atrophic vaginitis   . Colon polyps   . Diverticulosis   . Fibrocystic breast disease   . GERD (gastroesophageal reflux disease)   . Hyperlipidemia   . Postmenopausal   . Sinusitis    Past Surgical History:  Procedure Laterality Date  . ABDOMINAL HYSTERECTOMY  2005   partial  . APPENDECTOMY    . BLEPHAROPLASTY    . Bone Spur     removal - left index finger  . CATARACT EXTRACTION, BILATERAL  2000, 2005  . EXCISION MORTON'S NEUROMA  [id number]   Dr. Albertine Patricia  . HERNIA REPAIR    . lumb hernia    . RENAL CYST EXCISION    . TUBAL LIGATION     Family History  Problem Relation Age of Onset  . Cancer Father        prostate  . Lung cancer Unknown        parent  . Hypercholesterolemia Unknown        parent   Social History   Socioeconomic History  . Marital status: Married    Spouse name: Not on file  . Number of children: 2  . Years of education: Not on file  . Highest education level: Not on file  Occupational History  . Not on file  Social Needs  . Financial resource strain: Not on file  . Food insecurity:    Worry: Not on file    Inability: Not on file  . Transportation needs:    Medical: Not on file    Non-medical: Not on file  Tobacco Use  . Smoking status: Former Research scientist (life sciences)  . Smokeless tobacco: Never Used  Substance and Sexual Activity  . Alcohol use: Yes    Alcohol/week: 0.0  standard drinks  . Drug use: No  . Sexual activity: Not on file  Lifestyle  . Physical activity:    Days per week: Not on file    Minutes per session: Not on file  . Stress: Not on file  Relationships  . Social connections:    Talks on phone: Not on file    Gets together: Not on file    Attends religious service: Not on file    Active member of club or organization: Not on file    Attends meetings of clubs or organizations: Not on file    Relationship status: Not on file  Other Topics Concern  . Not on file  Social History Narrative  . Not on file    Outpatient Encounter Medications as of 10/31/2017  Medication Sig  . ALPRAZolam (XANAX) 0.25 MG tablet Take 1 tablet (0.25 mg total) by mouth daily as needed.  Marland Kitchen buPROPion (WELLBUTRIN XL) 150 MG 24 hr tablet TAKE THREE TABLETS EVERY DAY  . estradiol (ESTRACE)  0.5 MG tablet TAKE 1 TABLET BY MOUTH DAILY  . fluticasone (FLONASE) 50 MCG/ACT nasal spray PLACE 2 SPARYS INTO BOTH NOSTRILS DAILY  . MILK THISTLE PO Take by mouth 2 (two) times daily.  . Omega-3 Fatty Acids (FISH OIL PO) Take by mouth daily.  . pantoprazole (PROTONIX) 40 MG tablet TAKE ONE TABLET EVERY DAY  . Turmeric 400 MG CAPS Take by mouth.  . [DISCONTINUED] estradiol (ESTRACE) 0.5 MG tablet TAKE 1 TABLET BY MOUTH DAILY  . [DISCONTINUED] scopolamine (TRANSDERM-SCOP, 1.5 MG,) 1 MG/3DAYS Place 1 patch (1.5 mg total) onto the skin every 3 (three) days.   No facility-administered encounter medications on file as of 10/31/2017.     Review of Systems  Constitutional: Negative for appetite change and unexpected weight change.  HENT: Negative for congestion and sinus pressure.   Eyes: Negative for pain and visual disturbance.  Respiratory: Negative for cough, chest tightness and shortness of breath.   Cardiovascular: Negative for chest pain, palpitations and leg swelling.  Gastrointestinal: Negative for diarrhea, nausea and vomiting.  Genitourinary: Negative for difficulty  urinating and dysuria.  Musculoskeletal: Negative for joint swelling and myalgias.  Skin: Negative for color change and rash.  Neurological: Negative for dizziness, light-headedness and headaches.  Hematological: Negative for adenopathy. Does not bruise/bleed easily.  Psychiatric/Behavioral: Negative for agitation and dysphoric mood.       Objective:    Physical Exam  Constitutional: She is oriented to person, place, and time. She appears well-developed and well-nourished. No distress.  HENT:  Nose: Nose normal.  Mouth/Throat: Oropharynx is clear and moist.  Eyes: Right eye exhibits no discharge. Left eye exhibits no discharge. No scleral icterus.  Neck: Neck supple. No thyromegaly present.  Cardiovascular: Normal rate and regular rhythm.  Pulmonary/Chest: Breath sounds normal. No accessory muscle usage. No tachypnea. No respiratory distress. She has no decreased breath sounds. She has no wheezes. She has no rhonchi. Right breast exhibits no inverted nipple, no mass, no nipple discharge and no tenderness (no axillary adenopathy). Left breast exhibits no inverted nipple, no mass, no nipple discharge and no tenderness (no axilarry adenopathy).  Abdominal: Soft. Bowel sounds are normal.  Musculoskeletal: She exhibits no edema or tenderness.  Lymphadenopathy:    She has no cervical adenopathy.  Neurological: She is alert and oriented to person, place, and time.  Skin: No rash noted. No erythema.  Psychiatric: She has a normal mood and affect. Her behavior is normal.    BP 122/78 (BP Location: Left Arm, Patient Position: Sitting, Cuff Size: Normal)   Pulse 79   Temp 97.9 F (36.6 C) (Oral)   Resp 18   Ht 5' 1"  (1.549 m)   Wt 159 lb 9.6 oz (72.4 kg)   LMP 02/28/1990   BMI 30.16 kg/m  Wt Readings from Last 3 Encounters:  10/31/17 159 lb 9.6 oz (72.4 kg)  04/27/17 158 lb 3.2 oz (71.8 kg)  10/28/16 154 lb 9.6 oz (70.1 kg)     Lab Results  Component Value Date   WBC 7.4  04/14/2017   HGB 14.7 04/14/2017   HCT 43.3 04/14/2017   PLT 231.0 04/14/2017   GLUCOSE 106 (H) 10/27/2017   CHOL 181 10/27/2017   TRIG 276.0 (H) 10/27/2017   HDL 44.40 10/27/2017   LDLDIRECT 100.0 10/27/2017   LDLCALC 116 (H) 06/21/2013   ALT 45 (H) 10/27/2017   AST 31 10/27/2017   NA 140 10/27/2017   K 4.2 10/27/2017   CL 104 10/27/2017   CREATININE  0.92 10/27/2017   BUN 11 10/27/2017   CO2 27 10/27/2017   TSH 3.57 04/14/2017   HGBA1C 5.6 10/27/2017       Assessment & Plan:   Problem List Items Addressed This Visit    Abnormal liver function test    Previous abdominal ultrasound revealed fatty liver.  Previously evaluated by Dr Allyn Kenner.  Discussed diet and exercise.  Follow liver panel.   Lab Results  Component Value Date   ALT 45 (H) 10/27/2017   AST 31 10/27/2017   ALKPHOS 50 10/27/2017   BILITOT 0.5 10/27/2017        Relevant Orders   Hepatic function panel   Alcohol use ([place])    She has cut down alcohol intake.  Follow.        Health care maintenance    Physical today 11/01/17.  Mammogram 01/04/17 - Birads II.  Colonoscopy 03/07/14.        Hypercholesterolemia    Low cholesterol diet and exercise.  Follow lipid panel.        Relevant Orders   Lipid panel   Hyperglycemia    Low carb diet and exercise.  Follow met b and a1c.        Relevant Orders   Hemoglobin K0U   Basic metabolic panel   Renal cyst    Was followed by urology.  She reports no f/u warranted.  I discussed with her again today.  She declines further w/up or f/u.        Stress    Stress is better.  She feels she is doing better.  Follow.         Other Visit Diagnoses    Routine general medical examination at a health care facility    -  Primary       Einar Pheasant, MD

## 2017-11-06 ENCOUNTER — Encounter: Payer: Self-pay | Admitting: Internal Medicine

## 2017-11-06 DIAGNOSIS — R739 Hyperglycemia, unspecified: Secondary | ICD-10-CM | POA: Insufficient documentation

## 2017-11-06 NOTE — Assessment & Plan Note (Signed)
Was followed by urology.  She reports no f/u warranted.  I discussed with her again today.  She declines further w/up or f/u.

## 2017-11-06 NOTE — Assessment & Plan Note (Signed)
Physical today 11/01/17.  Mammogram 01/04/17 - Birads II.  Colonoscopy 03/07/14.

## 2017-11-06 NOTE — Assessment & Plan Note (Signed)
Low cholesterol diet and exercise.  Follow lipid panel.   

## 2017-11-06 NOTE — Assessment & Plan Note (Signed)
She has cut down alcohol intake.  Follow.

## 2017-11-06 NOTE — Assessment & Plan Note (Signed)
Stress is better.  She feels she is doing better.  Follow.

## 2017-11-06 NOTE — Assessment & Plan Note (Signed)
Low carb diet and exercise.  Follow met b and a1c.   

## 2017-11-06 NOTE — Assessment & Plan Note (Signed)
Previous abdominal ultrasound revealed fatty liver.  Previously evaluated by Dr Allyn Kenner.  Discussed diet and exercise.  Follow liver panel.   Lab Results  Component Value Date   ALT 45 (H) 10/27/2017   AST 31 10/27/2017   ALKPHOS 50 10/27/2017   BILITOT 0.5 10/27/2017

## 2017-11-17 ENCOUNTER — Other Ambulatory Visit: Payer: Self-pay | Admitting: Internal Medicine

## 2017-11-18 NOTE — Telephone Encounter (Signed)
rx ok'd for alprazolam #30 with 1 refill.   

## 2018-01-11 DIAGNOSIS — Z1231 Encounter for screening mammogram for malignant neoplasm of breast: Secondary | ICD-10-CM | POA: Diagnosis not present

## 2018-01-11 LAB — HM MAMMOGRAPHY

## 2018-01-16 ENCOUNTER — Encounter: Payer: Self-pay | Admitting: Internal Medicine

## 2018-01-17 MED ORDER — ALPRAZOLAM 0.25 MG PO TABS: ORAL_TABLET | ORAL | 1 refills | Status: DC

## 2018-01-17 NOTE — Telephone Encounter (Signed)
Last OV 10/31/2017 Next OV 04/28/18 Last refill 11/18/2017

## 2018-01-24 ENCOUNTER — Other Ambulatory Visit: Payer: Self-pay | Admitting: Internal Medicine

## 2018-02-22 ENCOUNTER — Other Ambulatory Visit: Payer: Self-pay | Admitting: Internal Medicine

## 2018-03-20 ENCOUNTER — Other Ambulatory Visit: Payer: Self-pay

## 2018-03-20 ENCOUNTER — Encounter: Payer: Self-pay | Admitting: Internal Medicine

## 2018-03-20 MED ORDER — ALPRAZOLAM 0.25 MG PO TABS: ORAL_TABLET | ORAL | 1 refills | Status: DC

## 2018-03-20 NOTE — Telephone Encounter (Signed)
Refill sent to provider

## 2018-03-20 NOTE — Progress Notes (Signed)
Last OV 10/31/2017 Next OV 05/02/18 Last refill 01/17/18

## 2018-03-20 NOTE — Progress Notes (Signed)
ok'd xanax #30 with one refill.

## 2018-03-21 ENCOUNTER — Other Ambulatory Visit: Payer: Self-pay | Admitting: Internal Medicine

## 2018-03-24 ENCOUNTER — Other Ambulatory Visit: Payer: Self-pay | Admitting: Internal Medicine

## 2018-04-17 DIAGNOSIS — M5442 Lumbago with sciatica, left side: Secondary | ICD-10-CM | POA: Diagnosis not present

## 2018-04-17 DIAGNOSIS — M9903 Segmental and somatic dysfunction of lumbar region: Secondary | ICD-10-CM | POA: Diagnosis not present

## 2018-04-19 DIAGNOSIS — M5442 Lumbago with sciatica, left side: Secondary | ICD-10-CM | POA: Diagnosis not present

## 2018-04-19 DIAGNOSIS — M9903 Segmental and somatic dysfunction of lumbar region: Secondary | ICD-10-CM | POA: Diagnosis not present

## 2018-04-25 ENCOUNTER — Encounter: Payer: Self-pay | Admitting: Internal Medicine

## 2018-04-27 ENCOUNTER — Other Ambulatory Visit: Payer: Self-pay | Admitting: Internal Medicine

## 2018-04-28 ENCOUNTER — Other Ambulatory Visit: Payer: Medicare Other

## 2018-04-28 ENCOUNTER — Other Ambulatory Visit: Payer: Self-pay | Admitting: Internal Medicine

## 2018-05-02 ENCOUNTER — Ambulatory Visit: Payer: Medicare Other | Admitting: Internal Medicine

## 2018-05-02 ENCOUNTER — Encounter: Payer: Self-pay | Admitting: Internal Medicine

## 2018-05-18 ENCOUNTER — Other Ambulatory Visit: Payer: Self-pay | Admitting: Internal Medicine

## 2018-05-18 NOTE — Telephone Encounter (Signed)
Last OV 10/31/2017 Next OV none scheduled Last refill 03/20/18

## 2018-05-20 NOTE — Telephone Encounter (Signed)
rx ok'd for xanax #30 with 1 refill.

## 2018-05-24 ENCOUNTER — Telehealth: Payer: Self-pay

## 2018-05-24 NOTE — Telephone Encounter (Signed)
Copied from [facility] [phone]. Topic: General - Other >> May 24, 2018 12:18 PM Carolyn Stare wrote:  Pt had her 6 month fup  appt that was scheduled in March and she cancel said she is not having any issues and wanted  to ask Dr Nicki Reaper if she need to see her now or will it be ok for her to  wait till her CPE in Sept. She would like to schedule her CPE please call to schedule

## 2018-05-25 NOTE — Telephone Encounter (Signed)
Pt scheduled for doxy

## 2018-05-29 ENCOUNTER — Encounter: Payer: Self-pay | Admitting: Internal Medicine

## 2018-05-29 ENCOUNTER — Ambulatory Visit (INDEPENDENT_AMBULATORY_CARE_PROVIDER_SITE_OTHER): Payer: Medicare Other | Admitting: Internal Medicine

## 2018-05-29 DIAGNOSIS — E7800 Pure hypercholesterolemia, unspecified: Secondary | ICD-10-CM | POA: Diagnosis not present

## 2018-05-29 DIAGNOSIS — R739 Hyperglycemia, unspecified: Secondary | ICD-10-CM

## 2018-05-29 DIAGNOSIS — R7989 Other specified abnormal findings of blood chemistry: Secondary | ICD-10-CM

## 2018-05-29 DIAGNOSIS — R945 Abnormal results of liver function studies: Secondary | ICD-10-CM | POA: Diagnosis not present

## 2018-05-29 DIAGNOSIS — F439 Reaction to severe stress, unspecified: Secondary | ICD-10-CM

## 2018-05-29 NOTE — Progress Notes (Signed)
[name], female   DOB: [birth year], 68 y.o.   MRN: [id number] Virtual Visit via video Note  This visit type was conducted due to national recommendations for restrictions regarding the COVID-19 pandemic (e.g. social distancing).  This format is felt to be most appropriate for this patient at this time.  All issues noted in this document were discussed and addressed.  No physical exam was performed (except for noted visual exam findings with Video Visits).   I connected with Kara Burns on 05/29/18 at  1:30 PM EDT by a video enabled telemedicine application.  Verified that I am speaking with the correct person using two identifiers. Location patient: home Location provider: work Persons participating in the virtual visit: patient, provider  I discussed the limitations, risks, security and privacy concerns of performing an evaluation and management service by video and the availability of in person appointments.The patient expressed understanding and agreed to proceed.   Reason for visit: scheduled follow up.   HPI: She reports she is doing well.  Feels good.  Feels she is handling things relatively well.  Increased stress with her grandson's issues, but overall she feels she is coping ok.  Does not feel needs anything more at this time.  Takes xanax daily.  Does well with this dose and wants to continue.  Is walking on a treadmill or outside at least 30 minutes per day.  No chest pain.  No sob.  No acid reflux.  No abdominal pain.  Bowels moving.  Wants to hold on labs until her physical and states her physical is due 10/2018.    ROS: See pertinent positives and negatives per HPI.  Past Medical History:  Diagnosis Date  . Abdominal wall hernia    secondary to large left renal cyst removal  . Atrophic vaginitis   . Colon polyps   . Diverticulosis   . Fibrocystic breast disease   . GERD (gastroesophageal reflux disease)   . Hyperlipidemia   . Postmenopausal   . Sinusitis      Past Surgical History:  Procedure Laterality Date  . ABDOMINAL HYSTERECTOMY  2005   partial  . APPENDECTOMY    . BLEPHAROPLASTY    . Bone Spur     removal - left index finger  . CATARACT EXTRACTION, BILATERAL  2000, 2005  . EXCISION MORTON'S NEUROMA  [id number]   Dr. Albertine Patricia  . HERNIA REPAIR    . lumb hernia    . RENAL CYST EXCISION    . TUBAL LIGATION      Family History  Problem Relation Age of Onset  . Cancer Father        prostate  . Lung cancer Other        parent  . Hypercholesterolemia Other        parent    SOCIAL HX: reviewed.    Current Outpatient Medications:  .  ALPRAZolam (XANAX) 0.25 MG tablet, TAKE ONE TABLET EVERY DAY AS NEEDED, Disp: 30 tablet, Rfl: 1 .  buPROPion (WELLBUTRIN XL) 150 MG 24 hr tablet, TAKE THREE TABLETS BY MOUTH ONCE A DAY, Disp: 90 tablet, Rfl: 1 .  estradiol (ESTRACE) 0.5 MG tablet, TAKE ONE TABLET BY MOUTH EVERY DAY, Disp: 90 tablet, Rfl: 0 .  fluticasone (FLONASE) 50 MCG/ACT nasal spray, PLACE 2 SPRAYS INTO BOTH NOSTRILS DAILY, Disp: 16 g, Rfl: 1 .  MILK THISTLE PO, Take by mouth 2 (two) times daily., Disp: , Rfl:  .  Omega-3 Fatty Acids (FISH  OIL PO), Take by mouth daily., Disp: , Rfl:  .  pantoprazole (PROTONIX) 40 MG tablet, TAKE ONE TABLET EVERY DAY, Disp: 90 tablet, Rfl: 1 .  Turmeric 400 MG CAPS, Take by mouth., Disp: , Rfl:   EXAM:  GENERAL: alert, oriented, appears well and in no acute distress  HEENT: atraumatic, conjunttiva clear, no obvious abnormalities on inspection of external nose and ears  NECK: normal movements of the head and neck  LUNGS: on inspection no signs of respiratory distress, breathing rate appears normal, no obvious gross SOB, gasping or wheezing  CV: no obvious cyanosis  PSYCH/NEURO: pleasant and cooperative, no obvious depression or anxiety, speech and thought processing grossly intact  ASSESSMENT AND PLAN:  Discussed the following assessment and plan:  Abnormal liver function  test  Hypercholesterolemia - Plan: TSH, CBC with Differential/Platelet  Hyperglycemia  Stress  Abnormal liver function test Previous abdominal ultrasound revealed fatty liver.  Previously evaluated by Dr Allyn Kenner.  Discussed diet and exercise.  Follow liver function tests.    Hypercholesterolemia Low cholesterol diet and exercise.  Follow lipid panel.    Hyperglycemia She reports she is eating and snacking more recently.  Discussed low carb diet and continued exercise.  Follow met b and a1c.    Stress Discussed with her today.  She does take xanax daily.  Does not feel needs anything more at this time.  Follow.  Will notify me if anything changes.      I discussed the assessment and treatment plan with the patient. The patient was provided an opportunity to ask questions and all were answered. The patient agreed with the plan and demonstrated an understanding of the instructions.   The patient was advised to call back or seek an in-person evaluation if the symptoms worsen or if the condition fails to improve as anticipated.    Einar Pheasant, MD

## 2018-05-29 NOTE — Assessment & Plan Note (Signed)
She reports she is eating and snacking more recently.  Discussed low carb diet and continued exercise.  Follow met b and a1c.

## 2018-05-29 NOTE — Assessment & Plan Note (Signed)
Discussed with her today.  She does take xanax daily.  Does not feel needs anything more at this time.  Follow.  Will notify me if anything changes.

## 2018-05-29 NOTE — Assessment & Plan Note (Signed)
Low cholesterol diet and exercise.  Follow lipid panel.   

## 2018-05-29 NOTE — Assessment & Plan Note (Signed)
Previous abdominal ultrasound revealed fatty liver.  Previously evaluated by Dr Allyn Kenner.  Discussed diet and exercise.  Follow liver function tests.

## 2018-06-12 ENCOUNTER — Encounter: Payer: Self-pay | Admitting: Internal Medicine

## 2018-06-19 ENCOUNTER — Other Ambulatory Visit: Payer: Self-pay | Admitting: Internal Medicine

## 2018-06-23 ENCOUNTER — Other Ambulatory Visit: Payer: Self-pay | Admitting: Internal Medicine

## 2018-06-29 ENCOUNTER — Other Ambulatory Visit: Payer: Self-pay | Admitting: Internal Medicine

## 2018-07-10 ENCOUNTER — Other Ambulatory Visit: Payer: Self-pay | Admitting: Internal Medicine

## 2018-07-11 NOTE — Telephone Encounter (Signed)
Last OV 05/29/2018 Next OV 11/09/18 Last refill 05/20/2018

## 2018-07-12 DIAGNOSIS — M7521 Bicipital tendinitis, right shoulder: Secondary | ICD-10-CM | POA: Diagnosis not present

## 2018-07-12 NOTE — Telephone Encounter (Signed)
rx sent in for xanax #30 with one refill.

## 2018-09-11 ENCOUNTER — Encounter: Payer: Self-pay | Admitting: Internal Medicine

## 2018-09-11 MED ORDER — ALPRAZOLAM 0.25 MG PO TABS: ORAL_TABLET | ORAL | 1 refills | Status: DC

## 2018-09-11 NOTE — Telephone Encounter (Signed)
rx sent in for xanax #30 with one refill.

## 2018-09-19 ENCOUNTER — Other Ambulatory Visit: Payer: Self-pay | Admitting: Internal Medicine

## 2018-09-20 ENCOUNTER — Other Ambulatory Visit: Payer: Self-pay | Admitting: Internal Medicine

## 2018-10-11 DIAGNOSIS — K458 Other specified abdominal hernia without obstruction or gangrene: Secondary | ICD-10-CM | POA: Diagnosis not present

## 2018-10-19 ENCOUNTER — Other Ambulatory Visit: Payer: Self-pay

## 2018-10-19 ENCOUNTER — Ambulatory Visit (INDEPENDENT_AMBULATORY_CARE_PROVIDER_SITE_OTHER): Payer: Medicare Other

## 2018-10-19 DIAGNOSIS — Z0000 Encounter for general adult medical examination without abnormal findings: Secondary | ICD-10-CM | POA: Diagnosis not present

## 2018-10-19 NOTE — Progress Notes (Addendum)
Subjective:   Kara Burns is a 68 y.o. female who presents for an Initial Medicare Annual Wellness Visit.  Review of Systems    No ROS.  Medicare Wellness Virtual Visit.  Visual/audio telehealth visit, UTA vital signs.   See social history for additional risk factors.    Cardiac Risk Factors include: advanced age (>32men, >25 women)     Objective:    Today's Vitals   There is no height or weight on file to calculate BMI.  Advanced Directives 10/19/2018  Does Patient Have a Medical Advance Directive? Yes  Type of Paramedic of [place];Living will  Does patient want to make changes to medical advance directive? No - Patient declined  Copy of [place] in Chart? No - copy requested    Current Medications (verified) Outpatient Encounter Medications as of 10/19/2018  Medication Sig  . ALPRAZolam (XANAX) 0.25 MG tablet TAKE ONE TABLET BY MOUTH EVERY DAY AS NEEDED  . buPROPion (WELLBUTRIN XL) 150 MG 24 hr tablet TAKE THREE TABLETS BY MOUTH EVERY DAY  . fluticasone (FLONASE) 50 MCG/ACT nasal spray PLACE 2 SPRAYS INTO BOTH NOSTRILS DAILY  . MILK THISTLE PO Take by mouth 2 (two) times daily.  . Omega-3 Fatty Acids (FISH OIL PO) Take by mouth daily.  . pantoprazole (PROTONIX) 40 MG tablet TAKE ONE TABLET BY MOUTH EVERY DAY  . Turmeric 400 MG CAPS Take by mouth.  . [DISCONTINUED] estradiol (ESTRACE) 0.5 MG tablet TAKE ONE TABLET BY MOUTH EVERY DAY   No facility-administered encounter medications on file as of 10/19/2018.     Allergies (verified) Erythromycin and Erythromycin ethylsuccinate   History: Past Medical History:  Diagnosis Date  . Abdominal wall hernia    secondary to large left renal cyst removal  . Atrophic vaginitis   . Colon polyps   . Diverticulosis   . Fibrocystic breast disease   . GERD (gastroesophageal reflux disease)   . Hyperlipidemia   . Postmenopausal   . Sinusitis    Past Surgical History:  Procedure  Laterality Date  . ABDOMINAL HYSTERECTOMY  2005   partial  . APPENDECTOMY    . BLEPHAROPLASTY    . Bone Spur     removal - left index finger  . CATARACT EXTRACTION, BILATERAL  2000, 2005  . EXCISION MORTON'S NEUROMA  [id number]   Dr. Albertine Patricia  . HERNIA REPAIR    . lumb hernia    . RENAL CYST EXCISION    . TUBAL LIGATION     Family History  Problem Relation Age of Onset  . Cancer Father        prostate  . Lung cancer Other        parent  . Hypercholesterolemia Other        parent   Social History   Socioeconomic History  . Marital status: Married    Spouse name: Not on file  . Number of children: 2  . Years of education: Not on file  . Highest education level: Not on file  Occupational History  . Not on file  Social Needs  . Financial resource strain: Not hard at all  . Food insecurity    Worry: Never true    Inability: Never true  . Transportation needs    Medical: No    Non-medical: No  Tobacco Use  . Smoking status: Former Research scientist (life sciences)  . Smokeless tobacco: Never Used  Substance and Sexual Activity  . Alcohol use: Yes  Alcohol/week: 0.0 standard drinks  . Drug use: No  . Sexual activity: Not on file  Lifestyle  . Physical activity    Days per week: 0 days    Minutes per session: Not on file  . Stress: Not at all  Relationships  . Social Herbalist on phone: Not on file    Gets together: Not on file    Attends religious service: Not on file    Active member of club or organization: Not on file    Attends meetings of clubs or organizations: Not on file    Relationship status: Not on file  Other Topics Concern  . Not on file  Social History Narrative  . Not on file    Tobacco Counseling Counseling given: Not Answered   Clinical Intake:  Pre-visit preparation completed: Yes        Diabetes: No  How often do you need to have someone help you when you read instructions, pamphlets, or other written materials from your doctor or  pharmacy?: 1 - Never  Interpreter Needed?: No      Activities of Daily Living In your present state of health, do you have any difficulty performing the following activities: 10/19/2018  Hearing? N  Vision? N  Difficulty concentrating or making decisions? N  Walking or climbing stairs? N  Dressing or bathing? N  Doing errands, shopping? N  Preparing Food and eating ? N  Using the Toilet? N  In the past six months, have you accidently leaked urine? N  Do you have problems with loss of bowel control? N  Managing your Medications? N  Managing your Finances? N  Housekeeping or managing your Housekeeping? N  Some recent data might be hidden     Immunizations and Health Maintenance Immunization History  Administered Date(s) Administered  . DTaP 03/01/2007  . Hep A / Hep B 10/30/2014, 12/03/2014, 04/24/2015  . Influenza Split 11/12/2008, 11/11/2009  . Influenza, High Dose Seasonal PF 10/31/2017  . Influenza,inj,Quad PF,6+ Mos 11/03/2012, 11/01/2013, 10/23/2014, 10/28/2015  . Influenza-Unspecified 11/15/2016  . Pneumococcal Conjugate-13 01/01/2014  . Pneumococcal Polysaccharide-23 11/22/2017  . Zoster 10/04/2011  . Zoster Recombinat (Shingrix) 06/09/2016, 11/15/2016   Health Maintenance Due  Topic Date Due  . Hepatitis C Screening  August 28, 1950  . TETANUS/TDAP  09/26/1970  . DEXA SCAN  09/25/2016  . INFLUENZA VACCINE  09/09/2018    Patient Care Team: Einar Pheasant, MD as PCP - General (Internal Medicine)  Indicate any recent Medical Services you may have received from other than [facility] providers in the past year (date may be approximate).     Assessment:   This is a routine wellness examination for Kara Burns.  I connected with patient 10/19/18 at  9:00 AM EDT by an audio enabled telemedicine application and verified that I am speaking with the correct person using two identifiers. Patient stated full name and DOB. Patient gave permission to continue with virtual visit.  Patient's location was at home and Nurse's location was at [place] office.   Health Maintenance Due: -Influenza vaccine 2020- discussed; to be completed in season with doctor or local pharmacy.    -Tdap- discussed; to be completed with doctor in visit or local pharmacy.   -Dexa Scan- she plans to follow up with pcp. -Hep C screening- reports having it completed but records have not yet been received.  See completed HM at the end of note.   Eye: Visual acuity not assessed. Virtual visit. Wears corrective lenses. Followed  by their ophthalmologist every 12 months.   Dental: Visits every 6 months.    Hearing: Demonstrates normal hearing during visit.  Safety:  Patient feels safe at home- yes Patient does have smoke detectors at home- yes Patient does wear sunscreen or protective clothing when in direct sunlight - yes Patient does wear seat belt when in a moving vehicle - yes Patient drives- yes Adequate lighting in walkways free from debris- yes Grab bars and handrails used as appropriate- yes Ambulates with no assistive device Cell phone on person when ambulating outside of the home- yes  Social: Alcohol intake - yes      Smoking history- former   Smokers in home? none Illicit drug use? none  Depression: PHQ 2 &9 complete. See screening below. Denies irritability, anhedonia, sadness/tearfullness.  Stable.   Falls: See screening below.    Medication: Taking as directed and without issues.  Estrace discontinues per patient preference.   Covid-19: Precautions and sickness symptoms discussed. Wears mask, social distancing, hand hygiene as appropriate.   Activities of Daily Living Patient denies needing assistance with: household chores, feeding themselves, getting from bed to chair, getting to the toilet, bathing/showering, dressing, managing money, or preparing meals.   Memory: Patient is alert. Patient denies difficulty focusing or concentrating. Correctly identified  the president of the [place], season and recall. Patient likes to play words with friends on the computer for brain stimulation.  BMI- discussed the importance of a healthy diet, water intake and the benefits of aerobic exercise.  Educational material provided.  Physical activity- no routine. Encouraged to walk for exercise, stay active.   Diet:  Regular Water: good intake Caffeine: 1 cup of coffee or coke, rare Ensure/Protein supplement: yes  Advanced Directive: End of life planning; Advance aging; Advanced directives discussed.  Copy of current HCPOA/Living Will requested.    Other Providers Patient Care Team: Einar Pheasant, MD as PCP - General (Internal Medicine)  Hearing/Vision screen  Hearing Screening   125Hz  250Hz  500Hz  1000Hz  2000Hz  3000Hz  4000Hz  6000Hz  8000Hz   Right ear:           Left ear:           Comments: Patient is able to hear conversational tones without difficulty.  No issues reported.    Vision Screening Comments: Wears corrective lenses Cataract extraction, bilateral Visual acuity not assessed, virtual visit.  They have seen their ophthalmologist in the last 12 months.     Dietary issues and exercise activities discussed: Current Exercise Habits: The patient does not participate in regular exercise at present  Goals      Patient Stated   . Increase physical activity (pt-stated)     Resume gym regimen and/or walk for exercise Monitor weight gain      Depression Screen PHQ 2/9 Scores 10/19/2018 12/30/2015 10/28/2015 10/23/2014 03/05/2013  PHQ - 2 Score 0 0 0 2 2  PHQ- 9 Score - - - 10 4    Fall Risk Fall Risk  10/19/2018 12/30/2015 10/28/2015 10/23/2014 03/05/2013  Falls in the past year? 0 Yes No No No  Number falls in past yr: - 1 - - -  Injury with Fall? - No - - -   Timed Get Up and Go Performed no, virtual visit  Cognitive Function:     6CIT Screen 10/19/2018  What Year? 0 points  What month? 0 points  What time? 0 points  Count back from  20 0 points  Months in reverse 0 points  Repeat phrase  0 points  Total Score 0    Screening Tests Health Maintenance  Topic Date Due  . Hepatitis C Screening  10-10-1950  . TETANUS/TDAP  09/26/1970  . DEXA SCAN  09/25/2016  . INFLUENZA VACCINE  09/09/2018  . COLONOSCOPY  03/08/2019  . MAMMOGRAM  01/12/2020  . PNA vac Low Risk Adult  Completed     Plan:   Keep all routine maintenance appointments.   Next scheduled lab 11/07/18  [facility] 11/09/18 -Requests CT Scan referral for hernia of flank  Medicare Attestation I have personally reviewed: The patient's medical and social history Their use of alcohol, tobacco or illicit drugs Their current medications and supplements The patient's functional ability including ADLs,fall risks, home safety risks, cognitive, and hearing and visual impairment Diet and physical activities Evidence for depression   In addition, I have reviewed and discussed with patient certain preventive protocols, quality metrics, and best practice recommendations. A written personalized care plan for preventive services as well as general preventive health recommendations were provided to patient via mail.     Varney Biles, LPN   [phone]    Reviewed above information.  Agree with assessment and plan.    Dr Nicki Reaper

## 2018-10-19 NOTE — Patient Instructions (Addendum)
  Ms. Rinn , Thank you for taking time to come for your Medicare Wellness Visit. I appreciate your ongoing commitment to your health goals. Please review the following plan we discussed and let me know if I can assist you in the future.   These are the goals we discussed: Goals      Patient Stated   . Increase physical activity (pt-stated)     Resume gym regimen and/or walk for exercise Monitor weight gain       This is a list of the screening recommended for you and due dates:  Health Maintenance  Topic Date Due  .  Hepatitis C: One time screening is recommended by Center for Disease Control  (CDC) for  adults born from [birth year] through 1964.   October 28, 1950  . Tetanus Vaccine  09/26/1970  . DEXA scan (bone density measurement)  09/25/2016  . Flu Shot  09/09/2018  . Colon Cancer Screening  03/08/2019  . Mammogram  01/12/2020  . Pneumonia vaccines  Completed

## 2018-10-30 ENCOUNTER — Encounter: Payer: Self-pay | Admitting: Internal Medicine

## 2018-10-30 ENCOUNTER — Other Ambulatory Visit: Payer: Self-pay

## 2018-10-30 ENCOUNTER — Ambulatory Visit (INDEPENDENT_AMBULATORY_CARE_PROVIDER_SITE_OTHER): Payer: Medicare Other | Admitting: Internal Medicine

## 2018-10-30 DIAGNOSIS — H9202 Otalgia, left ear: Secondary | ICD-10-CM | POA: Diagnosis not present

## 2018-10-30 NOTE — Telephone Encounter (Signed)
Pt has been scheduled.  °

## 2018-10-30 NOTE — Progress Notes (Signed)
[name], female   DOB: Sep 14, 1950, 68 y.o.   MRN: [id number]   Virtual Visit via video Note  This visit type was conducted due to national recommendations for restrictions regarding the COVID-19 pandemic (e.g. social distancing).  This format is felt to be most appropriate for this patient at this time.  All issues noted in this document were discussed and addressed.  No physical exam was performed (except for noted visual exam findings with Video Visits).   I connected with Leonne Moneypenny by a video enabled telemedicine application and verified that I am speaking with the correct person using two identifiers. Location patient: home Location provider: work Persons participating in the virtual visit: patient, provider  I discussed the limitations, risks, security and privacy concerns of performing an evaluation and management service by video and the availability of in person appointments.  The patient expressed understanding and agreed to proceed.   Reason for visit: work in appt.    HPI: She reports pain anterior to her ear.  No pain down in her ear.  Occasionally will notice some pain up to top of her head, but no headache.  No rash.  No fever.  Does hurt to swallow.  Some post nasal drainage and some minimal congestion.  No chest congestion.  No sob.  Does have some tooth problems.  Plans to see her dentist soon.  Eating.  No pain with opening and closing her mouth.     ROS: See pertinent positives and negatives per HPI.  Past Medical History:  Diagnosis Date  . Abdominal wall hernia    secondary to large left renal cyst removal  . Atrophic vaginitis   . Colon polyps   . Diverticulosis   . Fibrocystic breast disease   . GERD (gastroesophageal reflux disease)   . Hyperlipidemia   . Postmenopausal   . Sinusitis     Past Surgical History:  Procedure Laterality Date  . ABDOMINAL HYSTERECTOMY  2005   partial  . APPENDECTOMY    . BLEPHAROPLASTY    . Bone Spur     removal - left index finger  . CATARACT EXTRACTION, BILATERAL  2000, 2005  . EXCISION MORTON'S NEUROMA  [id number]   Dr. Albertine Patricia  . HERNIA REPAIR    . lumb hernia    . RENAL CYST EXCISION    . TUBAL LIGATION      Family History  Problem Relation Age of Onset  . Cancer Father        prostate  . Lung cancer Other        parent  . Hypercholesterolemia Other        parent    SOCIAL HX: reviewed.    Current Outpatient Medications:  .  ALPRAZolam (XANAX) 0.25 MG tablet, TAKE ONE TABLET BY MOUTH EVERY DAY AS NEEDED, Disp: 30 tablet, Rfl: 1 .  buPROPion (WELLBUTRIN XL) 150 MG 24 hr tablet, TAKE THREE TABLETS BY MOUTH EVERY DAY, Disp: 90 tablet, Rfl: 1 .  Cholecalciferol (VITAMIN D3) 50 MCG (2000 UT) TABS, Take by mouth. Soft gels, Disp: , Rfl:  .  fluticasone (FLONASE) 50 MCG/ACT nasal spray, PLACE 2 SPRAYS INTO BOTH NOSTRILS DAILY, Disp: 16 g, Rfl: 3 .  MILK THISTLE PO, Take by mouth 2 (two) times daily., Disp: , Rfl:  .  Omega-3 Fatty Acids (FISH OIL PO), Take by mouth daily., Disp: , Rfl:  .  pantoprazole (PROTONIX) 40 MG tablet, TAKE ONE TABLET BY MOUTH EVERY DAY, Disp:  90 tablet, Rfl: 1 .  Turmeric 400 MG CAPS, Take by mouth., Disp: , Rfl:  .  vitamin C (ASCORBIC ACID) 500 MG tablet, Take 500 mg by mouth daily., Disp: , Rfl:   EXAM:  GENERAL: alert, oriented, appears well and in no acute distress  HEENT: atraumatic, conjunttiva clear, no obvious abnormalities on inspection of external nose and ears  NECK: normal movements of the head and neck  LUNGS: on inspection no signs of respiratory distress, breathing rate appears normal, no obvious gross SOB, gasping or wheezing  CV: no obvious cyanosis  PSYCH/NEURO: pleasant and cooperative, no obvious depression or anxiety, speech and thought processing grossly intact  ASSESSMENT AND PLAN:  Discussed the following assessment and plan:  Ear pain Describes pain as outlined - left ear.  No rash.  Discussed limitations to  exam given virtual.  Cannot look into her ear.  She denies pain in her ear.  Some drainage.  Discussed TMJ.  Treat with antiinflammatories as directed.  Saline nasal spray and steroid nasal spray as directed.  Call with update.      I discussed the assessment and treatment plan with the patient. The patient was provided an opportunity to ask questions and all were answered. The patient agreed with the plan and demonstrated an understanding of the instructions.   The patient was advised to call back or seek an in-person evaluation if the symptoms worsen or if the condition fails to improve as anticipated.   Einar Pheasant, MD

## 2018-11-01 ENCOUNTER — Encounter: Payer: Self-pay | Admitting: Internal Medicine

## 2018-11-04 ENCOUNTER — Encounter: Payer: Self-pay | Admitting: Internal Medicine

## 2018-11-04 DIAGNOSIS — H9209 Otalgia, unspecified ear: Secondary | ICD-10-CM | POA: Insufficient documentation

## 2018-11-04 NOTE — Assessment & Plan Note (Signed)
Describes pain as outlined - left ear.  No rash.  Discussed limitations to exam given virtual.  Cannot look into her ear.  She denies pain in her ear.  Some drainage.  Discussed TMJ.  Treat with antiinflammatories as directed.  Saline nasal spray and steroid nasal spray as directed.  Call with update.

## 2018-11-07 ENCOUNTER — Other Ambulatory Visit: Payer: Self-pay

## 2018-11-07 ENCOUNTER — Other Ambulatory Visit (INDEPENDENT_AMBULATORY_CARE_PROVIDER_SITE_OTHER): Payer: Medicare Other

## 2018-11-07 DIAGNOSIS — R7989 Other specified abnormal findings of blood chemistry: Secondary | ICD-10-CM

## 2018-11-07 DIAGNOSIS — R739 Hyperglycemia, unspecified: Secondary | ICD-10-CM

## 2018-11-07 DIAGNOSIS — R945 Abnormal results of liver function studies: Secondary | ICD-10-CM | POA: Diagnosis not present

## 2018-11-07 DIAGNOSIS — E7800 Pure hypercholesterolemia, unspecified: Secondary | ICD-10-CM | POA: Diagnosis not present

## 2018-11-07 LAB — CBC WITH DIFFERENTIAL/PLATELET
Basophils Absolute: 0.1 10*3/uL (ref 0.0–0.1)
Basophils Relative: 0.7 % (ref 0.0–3.0)
Eosinophils Absolute: 1.5 10*3/uL — ABNORMAL HIGH (ref 0.0–0.7)
Eosinophils Relative: 16.8 % — ABNORMAL HIGH (ref 0.0–5.0)
HCT: 42.2 % (ref 36.0–46.0)
Hemoglobin: 14.4 g/dL (ref 12.0–15.0)
Lymphocytes Relative: 20 % (ref 12.0–46.0)
Lymphs Abs: 1.8 10*3/uL (ref 0.7–4.0)
MCHC: 34.1 g/dL (ref 30.0–36.0)
MCV: 99.8 fl (ref 78.0–100.0)
Monocytes Absolute: 0.6 10*3/uL (ref 0.1–1.0)
Monocytes Relative: 6.3 % (ref 3.0–12.0)
Neutro Abs: 5 10*3/uL (ref 1.4–7.7)
Neutrophils Relative %: 56.2 % (ref 43.0–77.0)
Platelets: 222 10*3/uL (ref 150.0–400.0)
RBC: 4.22 Mil/uL (ref 3.87–5.11)
RDW: 12.7 % (ref 11.5–15.5)
WBC: 8.9 10*3/uL (ref 4.0–10.5)

## 2018-11-07 LAB — LIPID PANEL
Cholesterol: 213 mg/dL — ABNORMAL HIGH (ref 0–200)
HDL: 59.2 mg/dL (ref >39.00)
NonHDL: 154.04
Total CHOL/HDL Ratio: 4
Triglycerides: 240 mg/dL — ABNORMAL HIGH (ref 0.0–149.0)
VLDL: 48 mg/dL — ABNORMAL HIGH (ref 0.0–40.0)

## 2018-11-07 LAB — HEPATIC FUNCTION PANEL
ALT: 69 U/L — ABNORMAL HIGH (ref 0–35)
AST: 45 U/L — ABNORMAL HIGH (ref 0–37)
Albumin: 4.4 g/dL (ref 3.5–5.2)
Alkaline Phosphatase: 55 U/L (ref 39–117)
Bilirubin, Direct: 0.1 mg/dL (ref 0.0–0.3)
Total Bilirubin: 0.7 mg/dL (ref 0.2–1.2)
Total Protein: 6.7 g/dL (ref 6.0–8.3)

## 2018-11-07 LAB — TSH: TSH: 4.14 u[IU]/mL (ref 0.35–4.50)

## 2018-11-07 LAB — LDL CHOLESTEROL, DIRECT: Direct LDL: 127 mg/dL

## 2018-11-07 LAB — HEMOGLOBIN A1C: Hgb A1c MFr Bld: 5.5 % (ref 4.6–6.5)

## 2018-11-08 ENCOUNTER — Encounter: Payer: Self-pay | Admitting: Internal Medicine

## 2018-11-08 NOTE — Telephone Encounter (Signed)
Advised that Dr Nicki Reaper had reviewed labs and will discuss with her in the morning. Advised that she should not be alarmed. Pt was okay to wait until in the morning

## 2018-11-09 ENCOUNTER — Other Ambulatory Visit: Payer: Self-pay

## 2018-11-09 ENCOUNTER — Ambulatory Visit (INDEPENDENT_AMBULATORY_CARE_PROVIDER_SITE_OTHER): Payer: Medicare Other | Admitting: Internal Medicine

## 2018-11-09 ENCOUNTER — Encounter: Payer: Self-pay | Admitting: Internal Medicine

## 2018-11-09 VITALS — BP 126/70 | HR 61 | Temp 98.2°F | Resp 16 | Ht 61.0 in | Wt 159.6 lb

## 2018-11-09 DIAGNOSIS — Z0000 Encounter for general adult medical examination without abnormal findings: Secondary | ICD-10-CM | POA: Diagnosis not present

## 2018-11-09 DIAGNOSIS — F439 Reaction to severe stress, unspecified: Secondary | ICD-10-CM

## 2018-11-09 DIAGNOSIS — R739 Hyperglycemia, unspecified: Secondary | ICD-10-CM | POA: Diagnosis not present

## 2018-11-09 DIAGNOSIS — E7800 Pure hypercholesterolemia, unspecified: Secondary | ICD-10-CM | POA: Diagnosis not present

## 2018-11-09 DIAGNOSIS — R1030 Lower abdominal pain, unspecified: Secondary | ICD-10-CM | POA: Diagnosis not present

## 2018-11-09 DIAGNOSIS — Z23 Encounter for immunization: Secondary | ICD-10-CM | POA: Diagnosis not present

## 2018-11-09 DIAGNOSIS — K219 Gastro-esophageal reflux disease without esophagitis: Secondary | ICD-10-CM

## 2018-11-09 DIAGNOSIS — R945 Abnormal results of liver function studies: Secondary | ICD-10-CM

## 2018-11-09 DIAGNOSIS — D7219 Other eosinophilia: Secondary | ICD-10-CM

## 2018-11-09 DIAGNOSIS — N281 Cyst of kidney, acquired: Secondary | ICD-10-CM

## 2018-11-09 DIAGNOSIS — R7989 Other specified abnormal findings of blood chemistry: Secondary | ICD-10-CM

## 2018-11-09 MED ORDER — PANTOPRAZOLE SODIUM 40 MG PO TBEC: DELAYED_RELEASE_TABLET | ORAL | 1 refills | Status: DC

## 2018-11-09 NOTE — Progress Notes (Signed)
Patient ID: Meganne L Spike, female   DOB: 10/22/1950, 68 y.o.   MRN: [id number]   Subjective:    Patient ID: Vianka L Hartgrove, female    DOB: 03/03/1950, 68 y.o.   MRN: [id number]  HPI  Patient here for her physical exam.  She reports she is doing relatively well.  Had questions about her labs.  Specifically the elevated eosinophilia count.  Previously has been essentially wnl.  She did have a fall recently.  Bumped her shin and injured her toe. Better now.  Some discomfort in her right shoulder, but better.  No significant residual issues.  No head injury.  No chest pain.  No sob.  No acid reflux. No abdominal pain.  Bowels moving.  Discussed labs.  Recent a1c 5.5.  Discussed cholesterol.  Liver function tests remain elevated. She requested f/u CT scan to evaluate the renal cyst, etc.  Recently evaluated by Dr DeFranzo - regarding the hernia. No nausea or vomiting.  Some persistent acid reflux.  Discussed increasing protonix to bid.      Past Medical History:  Diagnosis Date  . Abdominal wall hernia    secondary to large left renal cyst removal  . Atrophic vaginitis   . Colon polyps   . Diverticulosis   . Fibrocystic breast disease   . GERD (gastroesophageal reflux disease)   . Hyperlipidemia   . Postmenopausal   . Sinusitis    Past Surgical History:  Procedure Laterality Date  . ABDOMINAL HYSTERECTOMY  2005   partial  . APPENDECTOMY    . BLEPHAROPLASTY    . Bone Spur     removal - left index finger  . CATARACT EXTRACTION, BILATERAL  2000, 2005  . EXCISION MORTON'S NEUROMA  [id number]   Dr. Matthew Troxler  . HERNIA REPAIR    . lumb hernia    . RENAL CYST EXCISION    . TUBAL LIGATION     Family History  Problem Relation Age of Onset  . Cancer Father        prostate  . Lung cancer Other        parent  . Hypercholesterolemia Other        parent   Social History   Socioeconomic History  . Marital status: Married    Spouse name: Not on file  . Number of children: 2  . Years  of education: Not on file  . Highest education level: Not on file  Occupational History  . Not on file  Social Needs  . Financial resource strain: Not hard at all  . Food insecurity    Worry: Never true    Inability: Never true  . Transportation needs    Medical: No    Non-medical: No  Tobacco Use  . Smoking status: Former Smoker  . Smokeless tobacco: Never Used  Substance and Sexual Activity  . Alcohol use: Yes    Alcohol/week: 0.0 standard drinks  . Drug use: No  . Sexual activity: Not on file  Lifestyle  . Physical activity    Days per week: 0 days    Minutes per session: Not on file  . Stress: Not at all  Relationships  . Social connections    Talks on phone: Not on file    Gets together: Not on file    Attends religious service: Not on file    Active member of club or organization: Not on file    Attends meetings of clubs or organizations: Not on file      Relationship status: Not on file  Other Topics Concern  . Not on file  Social History Narrative  . Not on file    Outpatient Encounter Medications as of 11/09/2018  Medication Sig  . ALPRAZolam (XANAX) 0.25 MG tablet TAKE ONE TABLET BY MOUTH EVERY DAY AS NEEDED  . buPROPion (WELLBUTRIN XL) 150 MG 24 hr tablet TAKE THREE TABLETS BY MOUTH EVERY DAY  . Cholecalciferol (VITAMIN D3) 50 MCG (2000 UT) TABS Take by mouth. Soft gels  . fluticasone (FLONASE) 50 MCG/ACT nasal spray PLACE 2 SPRAYS INTO BOTH NOSTRILS DAILY  . MILK THISTLE PO Take by mouth 2 (two) times daily.  . Omega-3 Fatty Acids (FISH OIL PO) Take by mouth daily.  . pantoprazole (PROTONIX) 40 MG tablet TAKE ONE TABLET BY twice a day  . Turmeric 400 MG CAPS Take by mouth.  . vitamin C (ASCORBIC ACID) 500 MG tablet Take 500 mg by mouth daily.  . [DISCONTINUED] pantoprazole (PROTONIX) 40 MG tablet TAKE ONE TABLET BY MOUTH EVERY DAY   No facility-administered encounter medications on file as of 11/09/2018.     Review of Systems  Constitutional: Negative  for appetite change and unexpected weight change.  HENT: Negative for congestion and sinus pressure.   Eyes: Negative for pain and visual disturbance.  Respiratory: Negative for cough, chest tightness and shortness of breath.   Cardiovascular: Negative for chest pain, palpitations and leg swelling.  Gastrointestinal: Negative for diarrhea, nausea and vomiting.       Persistent acid reflux.  Some abdominal discomfort.   Genitourinary: Negative for difficulty urinating and dysuria.  Musculoskeletal: Negative for joint swelling and myalgias.  Skin: Negative for color change and rash.  Neurological: Negative for dizziness, light-headedness and headaches.  Hematological: Negative for adenopathy. Does not bruise/bleed easily.  Psychiatric/Behavioral: Negative for agitation and dysphoric mood.       Objective:    Physical Exam Constitutional:      General: She is not in acute distress.    Appearance: Normal appearance. She is well-developed.  HENT:     Right Ear: External ear normal.     Left Ear: External ear normal.  Eyes:     General: No scleral icterus.       Right eye: No discharge.        Left eye: No discharge.     Conjunctiva/sclera: Conjunctivae normal.  Neck:     Musculoskeletal: Neck supple. No muscular tenderness.     Thyroid: No thyromegaly.  Cardiovascular:     Rate and Rhythm: Normal rate and regular rhythm.  Pulmonary:     Effort: No tachypnea, accessory muscle usage or respiratory distress.     Breath sounds: Normal breath sounds. No decreased breath sounds or wheezing.  Chest:     Breasts:        Right: No inverted nipple, mass, nipple discharge or tenderness (no axillary adenopathy).        Left: No inverted nipple, mass, nipple discharge or tenderness (no axilarry adenopathy).  Abdominal:     General: Bowel sounds are normal.     Palpations: Abdomen is soft.     Comments: Minimal discomfort - left lateral side.    Musculoskeletal:        General: No  swelling or tenderness.  Lymphadenopathy:     Cervical: No cervical adenopathy.  Skin:    Findings: No erythema or rash.  Neurological:     Mental Status: She is alert and oriented to person, place, and time.  Psychiatric:        Mood and Affect: Mood normal.        Behavior: Behavior normal.     BP 126/70   Pulse 61   Temp 98.2 F (36.8 C)   Resp 16   Ht 5' 1" (1.549 m)   Wt 159 lb 9.6 oz (72.4 kg)   LMP 02/28/1990   SpO2 97%   BMI 30.16 kg/m  Wt Readings from Last 3 Encounters:  11/09/18 159 lb 9.6 oz (72.4 kg)  10/30/18 158 lb (71.7 kg)  10/31/17 159 lb 9.6 oz (72.4 kg)     Lab Results  Component Value Date   WBC 8.9 11/07/2018   HGB 14.4 11/07/2018   HCT 42.2 11/07/2018   PLT 222.0 11/07/2018   GLUCOSE 106 (H) 10/27/2017   CHOL 213 (H) 11/07/2018   TRIG 240.0 (H) 11/07/2018   HDL 59.20 11/07/2018   LDLDIRECT 127.0 11/07/2018   LDLCALC 116 (H) 06/21/2013   ALT 69 (H) 11/07/2018   AST 45 (H) 11/07/2018   NA 140 10/27/2017   K 4.2 10/27/2017   CL 104 10/27/2017   CREATININE 0.92 10/27/2017   BUN 11 10/27/2017   CO2 27 10/27/2017   TSH 4.14 11/07/2018   HGBA1C 5.5 11/07/2018       Assessment & Plan:   Problem List Items Addressed This Visit    Abdominal pain    No significant pain. Some discomfort.  Just evaluated by her surgeon.  F/u CT to reevaluate the renal cyst.  Increased protonix to bid to help with acid reflux.        Abnormal liver function test    Previous ultrasound revealed fatty liver.  Evaluated by Dr Medhoff.  Liver function tests are relatively stable.  Diet, exercise and weight loss.  Follow liver function tests.        Relevant Orders   CT Abdomen Pelvis W Contrast   Eosinophilia    Unclear etiology.  Recheck cbc.        Relevant Orders   CBC with Differential/Platelet   GERD (gastroesophageal reflux disease)    Increased acid reflux despite taking protonix daily.  Increase to bid.  Follow.  If persistent, will require f/u  with GI for question of need for EGD.       Relevant Medications   pantoprazole (PROTONIX) 40 MG tablet   Health care maintenance    Physical today 11/09/18.  Mammogram 01/11/18 - Birads I. ([facility]).  She will schedule f/u mammogram.  Colonoscopy 02/2014.        Hypercholesterolemia    Low cholesterol diet and exercise.  Follow lipid panel.       Hyperglycemia    Low carb diet and exercise.  Follow met b and a1c.        Relevant Orders   Basic metabolic panel   Renal cyst    Was followed by urology.  No longer seeing.  Schedule f/u CT scan to further evaluate and compare.        Relevant Orders   CT Abdomen Pelvis W Contrast   Stress    Increased stress.  Overall handling things relatively well.  Has xanax.  On wellbutrin.  Follow.        Other Visit Diagnoses    Need for immunization against influenza       Relevant Orders   Flu Vaccine QUAD High Dose(Fluad) (Completed)   Acquired cyst of kidney       Relevant Orders     CT Abdomen Pelvis W Contrast       Einar Pheasant, MD

## 2018-11-12 ENCOUNTER — Encounter: Payer: Self-pay | Admitting: Internal Medicine

## 2018-11-12 DIAGNOSIS — K219 Gastro-esophageal reflux disease without esophagitis: Secondary | ICD-10-CM | POA: Insufficient documentation

## 2018-11-12 DIAGNOSIS — D721 Eosinophilia, unspecified: Secondary | ICD-10-CM | POA: Insufficient documentation

## 2018-11-12 NOTE — Assessment & Plan Note (Signed)
Unclear etiology.  Recheck cbc.

## 2018-11-12 NOTE — Assessment & Plan Note (Signed)
Low cholesterol diet and exercise.  Follow lipid panel.   

## 2018-11-12 NOTE — Assessment & Plan Note (Signed)
No significant pain. Some discomfort.  Just evaluated by her surgeon.  F/u CT to reevaluate the renal cyst.  Increased protonix to bid to help with acid reflux.

## 2018-11-12 NOTE — Assessment & Plan Note (Signed)
Low carb diet and exercise.  Follow met b and a1c.   

## 2018-11-12 NOTE — Assessment & Plan Note (Signed)
Was followed by urology.  No longer seeing.  Schedule f/u CT scan to further evaluate and compare.

## 2018-11-12 NOTE — Assessment & Plan Note (Signed)
Increased acid reflux despite taking protonix daily.  Increase to bid.  Follow.  If persistent, will require f/u with GI for question of need for EGD.

## 2018-11-12 NOTE — Assessment & Plan Note (Signed)
Physical today 11/09/18.  Mammogram 01/11/18 - Birads I. ([facility]).  She will schedule f/u mammogram.  Colonoscopy 02/2014.

## 2018-11-12 NOTE — Assessment & Plan Note (Signed)
Increased stress.  Overall handling things relatively well.  Has xanax.  On wellbutrin.  Follow.

## 2018-11-12 NOTE — Assessment & Plan Note (Signed)
Previous ultrasound revealed fatty liver.  Evaluated by Dr Allyn Kenner.  Liver function tests are relatively stable.  Diet, exercise and weight loss.  Follow liver function tests.

## 2018-11-22 ENCOUNTER — Other Ambulatory Visit: Payer: Self-pay | Admitting: Internal Medicine

## 2018-11-22 ENCOUNTER — Other Ambulatory Visit (INDEPENDENT_AMBULATORY_CARE_PROVIDER_SITE_OTHER): Payer: Medicare Other

## 2018-11-22 ENCOUNTER — Other Ambulatory Visit: Payer: Self-pay

## 2018-11-22 DIAGNOSIS — R739 Hyperglycemia, unspecified: Secondary | ICD-10-CM

## 2018-11-22 DIAGNOSIS — D7219 Other eosinophilia: Secondary | ICD-10-CM

## 2018-11-23 ENCOUNTER — Telehealth: Payer: Self-pay

## 2018-11-23 LAB — CBC WITH DIFFERENTIAL/PLATELET
Basophils Absolute: 0.1 10*3/uL (ref 0.0–0.1)
Basophils Relative: 1.4 % (ref 0.0–3.0)
Eosinophils Absolute: 0.8 10*3/uL — ABNORMAL HIGH (ref 0.0–0.7)
Eosinophils Relative: 8.4 % — ABNORMAL HIGH (ref 0.0–5.0)
HCT: 42.3 % (ref 36.0–46.0)
Hemoglobin: 14.3 g/dL (ref 12.0–15.0)
Lymphocytes Relative: 25.6 % (ref 12.0–46.0)
Lymphs Abs: 2.3 10*3/uL (ref 0.7–4.0)
MCHC: 33.9 g/dL (ref 30.0–36.0)
MCV: 100.2 fl — ABNORMAL HIGH (ref 78.0–100.0)
Monocytes Absolute: 0.9 10*3/uL (ref 0.1–1.0)
Monocytes Relative: 9.7 % (ref 3.0–12.0)
Neutro Abs: 5 10*3/uL (ref 1.4–7.7)
Neutrophils Relative %: 54.9 % (ref 43.0–77.0)
Platelets: 249 10*3/uL (ref 150.0–400.0)
RBC: 4.22 Mil/uL (ref 3.87–5.11)
RDW: 13 % (ref 11.5–15.5)
WBC: 9.2 10*3/uL (ref 4.0–10.5)

## 2018-11-23 LAB — BASIC METABOLIC PANEL
BUN: 10 mg/dL (ref 6–23)
CO2: 28 mEq/L (ref 19–32)
Calcium: 9.9 mg/dL (ref 8.4–10.5)
Chloride: 103 mEq/L (ref 96–112)
Creatinine, Ser: 0.87 mg/dL (ref 0.40–1.20)
GFR: 64.91 mL/min (ref >60.00)
Glucose, Bld: 99 mg/dL (ref 70–99)
Potassium: 4.1 mEq/L (ref 3.5–5.1)
Sodium: 140 mEq/L (ref 135–145)

## 2018-11-23 NOTE — Telephone Encounter (Signed)
Sent my chart message to pt letting her know labs have not been reviewed yet.

## 2018-11-23 NOTE — Telephone Encounter (Signed)
Copied from [place] [phone]. Topic: Quick Communication - Lab Results (Clinic Use ONLY) >> Nov 23, 2018  8:54 AM Lennox Solders wrote: Pt is calling and would like blood work results from yesterday

## 2018-11-24 ENCOUNTER — Encounter: Payer: Self-pay | Admitting: Internal Medicine

## 2018-11-25 NOTE — Telephone Encounter (Signed)
rx ok'd for xanax #30 with one refill  

## 2018-11-27 ENCOUNTER — Encounter: Payer: Self-pay | Admitting: Internal Medicine

## 2018-11-29 ENCOUNTER — Encounter: Payer: Self-pay | Admitting: Internal Medicine

## 2018-11-29 DIAGNOSIS — M7521 Bicipital tendinitis, right shoulder: Secondary | ICD-10-CM | POA: Diagnosis not present

## 2018-11-29 NOTE — Telephone Encounter (Signed)
Kara Burns, this is the scan that insurance was denying.  Her surgeon recommended the scan.  Has a history of renal cysts, hernia surgery and abdominal pain.  Let me know if I need to do something about this.  Thanks

## 2018-12-26 ENCOUNTER — Other Ambulatory Visit: Payer: Self-pay | Admitting: Internal Medicine

## 2018-12-26 DIAGNOSIS — Z872 Personal history of diseases of the skin and subcutaneous tissue: Secondary | ICD-10-CM | POA: Diagnosis not present

## 2018-12-26 DIAGNOSIS — L578 Other skin changes due to chronic exposure to nonionizing radiation: Secondary | ICD-10-CM | POA: Diagnosis not present

## 2018-12-26 DIAGNOSIS — D1801 Hemangioma of skin and subcutaneous tissue: Secondary | ICD-10-CM | POA: Diagnosis not present

## 2018-12-26 DIAGNOSIS — Z86018 Personal history of other benign neoplasm: Secondary | ICD-10-CM | POA: Diagnosis not present

## 2019-01-12 ENCOUNTER — Encounter: Payer: Self-pay | Admitting: Internal Medicine

## 2019-01-16 ENCOUNTER — Other Ambulatory Visit: Payer: Self-pay

## 2019-01-16 ENCOUNTER — Ambulatory Visit (INDEPENDENT_AMBULATORY_CARE_PROVIDER_SITE_OTHER): Payer: Medicare Other | Admitting: Internal Medicine

## 2019-01-16 ENCOUNTER — Encounter: Payer: Self-pay | Admitting: Internal Medicine

## 2019-01-16 DIAGNOSIS — F439 Reaction to severe stress, unspecified: Secondary | ICD-10-CM

## 2019-01-16 DIAGNOSIS — D7219 Other eosinophilia: Secondary | ICD-10-CM | POA: Diagnosis not present

## 2019-01-16 DIAGNOSIS — R945 Abnormal results of liver function studies: Secondary | ICD-10-CM | POA: Diagnosis not present

## 2019-01-16 DIAGNOSIS — E7800 Pure hypercholesterolemia, unspecified: Secondary | ICD-10-CM

## 2019-01-16 DIAGNOSIS — K219 Gastro-esophageal reflux disease without esophagitis: Secondary | ICD-10-CM

## 2019-01-16 DIAGNOSIS — N281 Cyst of kidney, acquired: Secondary | ICD-10-CM

## 2019-01-16 DIAGNOSIS — R739 Hyperglycemia, unspecified: Secondary | ICD-10-CM

## 2019-01-16 DIAGNOSIS — R7989 Other specified abnormal findings of blood chemistry: Secondary | ICD-10-CM

## 2019-01-16 NOTE — Progress Notes (Signed)
[name], female   DOB: 1950/04/21, 68 y.o.   MRN: [id number]   Virtual Visit via doxy Note  This visit type was conducted due to national recommendations for restrictions regarding the COVID-19 pandemic (e.g. social distancing).  This format is felt to be most appropriate for this patient at this time.  All issues noted in this document were discussed and addressed.  No physical exam was performed (except for noted visual exam findings with Video Visits).   I connected with [name] by a video enabled telemedicine application or telephone and verified that I am speaking with the correct person using two identifiers. Location patient: home Location provider: work  Persons participating in the virtual visit: patient, provider  I discussed the limitations, risks, security and privacy concerns of performing an evaluation and management service by video and the availability of in person appointments. The patient expressed understanding and agreed to proceed.   Reason for visit: scheduled follow up.   HPI: Scheduled follow up.  Overall doing relatively well.  Discussed increased stress.  Appears to be doing better.  No chest pain or sob reported.  Previous visit, concern regarding increased acid reflux.  Discussed increasing protonix to bid dosing.  Has mammogram next week.  Wants to hold on bone density.  Discussed f/u CT - f/u renal cyst.  Insurance denied.  Discussed trying to get authorized.  Overall otherwise things stable.  Requested covid antibody test.     ROS: See pertinent positives and negatives per HPI.  Past Medical History:  Diagnosis Date  . Abdominal wall hernia    secondary to large left renal cyst removal  . Atrophic vaginitis   . Colon polyps   . Diverticulosis   . Fibrocystic breast disease   . GERD (gastroesophageal reflux disease)   . Hyperlipidemia   . Postmenopausal   . Sinusitis     Past Surgical History:  Procedure Laterality Date  .  ABDOMINAL HYSTERECTOMY  2005   partial  . APPENDECTOMY    . BLEPHAROPLASTY    . Bone Spur     removal - left index finger  . CATARACT EXTRACTION, BILATERAL  2000, 2005  . EXCISION MORTON'S NEUROMA  [id number]   Dr. Albertine Patricia  . HERNIA REPAIR    . lumb hernia    . RENAL CYST EXCISION    . TUBAL LIGATION      Family History  Problem Relation Age of Onset  . Cancer Father        prostate  . Lung cancer Other        parent  . Hypercholesterolemia Other        parent    SOCIAL HX: reviewed.    Current Outpatient Medications:  .  ALPRAZolam (XANAX) 0.25 MG tablet, TAKE ONE TABLET EVERY DAY AS NEEDED, Disp: 30 tablet, Rfl: 1 .  buPROPion (WELLBUTRIN XL) 150 MG 24 hr tablet, TAKE THREE TABLETS BY MOUTH EVERY DAY, Disp: 90 tablet, Rfl: 1 .  Cholecalciferol (VITAMIN D3) 50 MCG (2000 UT) TABS, Take by mouth. Soft gels, Disp: , Rfl:  .  fluticasone (FLONASE) 50 MCG/ACT nasal spray, PLACE 2 SPRAYS INTO BOTH NOSTRILS DAILY, Disp: 16 g, Rfl: 3 .  MILK THISTLE PO, Take by mouth 2 (two) times daily., Disp: , Rfl:  .  Omega-3 Fatty Acids (FISH OIL PO), Take by mouth daily., Disp: , Rfl:  .  pantoprazole (PROTONIX) 40 MG tablet, TAKE ONE TABLET BY twice a day, Disp: 180  tablet, Rfl: 1 .  Turmeric 400 MG CAPS, Take by mouth., Disp: , Rfl:  .  vitamin C (ASCORBIC ACID) 500 MG tablet, Take 500 mg by mouth daily., Disp: , Rfl:   EXAM:  GENERAL: alert, oriented, appears well and in no acute distress  HEENT: atraumatic, conjunttiva clear, no obvious abnormalities on inspection of external nose and ears  NECK: normal movements of the head and neck  LUNGS: on inspection no signs of respiratory distress, breathing rate appears normal, no obvious gross SOB, gasping or wheezing  CV: no obvious cyanosis  PSYCH/NEURO: pleasant and cooperative, no obvious depression or anxiety, speech and thought processing grossly intact  ASSESSMENT AND PLAN:  Discussed the following assessment and plan:   Abnormal liver function test Previous ultrasound revealed fatty liver.  Evaluated by Dr Allyn Kenner.  Follow liver function tests.  Diet, exercise.    Eosinophilia Repeat eosinophilic count improved.    GERD (gastroesophageal reflux disease) protonix bid.  Follow.    Hypercholesterolemia Low cholesterol diet and exercise.  Follow lipid panel.    Hyperglycemia Low carb diet and exercise.  Follow met b and a1c.    Renal cyst Saw Dr Eugene Garnet.  Recommended f/u CT.  Has known renal cyst.  Insurance denied.  F/u with insurance to try and get authorization.    Stress Increased stress.  Overall appears to be doing better.  On wellbutrin.  Follow.      I discussed the assessment and treatment plan with the patient. The patient was provided an opportunity to ask questions and all were answered. The patient agreed with the plan and demonstrated an understanding of the instructions.   The patient was advised to call back or seek an in-person evaluation if the symptoms worsen or if the condition fails to improve as anticipated.   Einar Pheasant, MD

## 2019-01-20 ENCOUNTER — Encounter: Payer: Self-pay | Admitting: Internal Medicine

## 2019-01-20 NOTE — Assessment & Plan Note (Signed)
Repeat eosinophilic count improved.

## 2019-01-20 NOTE — Assessment & Plan Note (Signed)
Low cholesterol diet and exercise.  Follow lipid panel.   

## 2019-01-20 NOTE — Assessment & Plan Note (Signed)
Low carb diet and exercise.  Follow met b and a1c.

## 2019-01-20 NOTE — Assessment & Plan Note (Signed)
Previous ultrasound revealed fatty liver.  Evaluated by Dr Allyn Kenner.  Follow liver function tests.  Diet, exercise.

## 2019-01-20 NOTE — Assessment & Plan Note (Signed)
Saw Dr Eugene Garnet.  Recommended f/u CT.  Has known renal cyst.  Insurance denied.  F/u with insurance to try and get authorization.

## 2019-01-20 NOTE — Assessment & Plan Note (Signed)
protonix bid.  Follow.

## 2019-01-20 NOTE — Assessment & Plan Note (Signed)
Increased stress.  Overall appears to be doing better.  On wellbutrin.  Follow.

## 2019-01-22 ENCOUNTER — Encounter: Payer: Self-pay | Admitting: Internal Medicine

## 2019-01-22 ENCOUNTER — Other Ambulatory Visit: Payer: Self-pay

## 2019-01-22 MED ORDER — ALPRAZOLAM 0.25 MG PO TABS: ORAL_TABLET | ORAL | 1 refills | Status: DC

## 2019-01-22 NOTE — Telephone Encounter (Signed)
Message sent to provider 

## 2019-01-22 NOTE — Progress Notes (Signed)
LOV: 01/16/19 NOV: 05/21/19 Last refill: 11/25/2018  rx sent in for alprazolam #30 with one refill.

## 2019-01-24 DIAGNOSIS — Z1231 Encounter for screening mammogram for malignant neoplasm of breast: Secondary | ICD-10-CM | POA: Diagnosis not present

## 2019-01-24 LAB — HM MAMMOGRAPHY

## 2019-01-25 ENCOUNTER — Encounter: Payer: Self-pay | Admitting: Internal Medicine

## 2019-01-25 ENCOUNTER — Other Ambulatory Visit: Payer: Self-pay

## 2019-01-25 MED ORDER — BUPROPION HCL ER (XL) 150 MG PO TB24: 450.0000 mg | ORAL_TABLET | Freq: Every day | ORAL | 1 refills | Status: DC

## 2019-01-26 ENCOUNTER — Other Ambulatory Visit (INDEPENDENT_AMBULATORY_CARE_PROVIDER_SITE_OTHER): Payer: Medicare Other

## 2019-01-26 ENCOUNTER — Other Ambulatory Visit: Payer: Self-pay

## 2019-01-26 DIAGNOSIS — R739 Hyperglycemia, unspecified: Secondary | ICD-10-CM

## 2019-01-26 DIAGNOSIS — R7989 Other specified abnormal findings of blood chemistry: Secondary | ICD-10-CM

## 2019-01-26 DIAGNOSIS — R945 Abnormal results of liver function studies: Secondary | ICD-10-CM

## 2019-01-26 DIAGNOSIS — E7800 Pure hypercholesterolemia, unspecified: Secondary | ICD-10-CM | POA: Diagnosis not present

## 2019-01-26 LAB — CBC WITH DIFFERENTIAL/PLATELET
Basophils Absolute: 0.1 10*3/uL (ref 0.0–0.1)
Basophils Relative: 1 % (ref 0.0–3.0)
Eosinophils Absolute: 0.3 10*3/uL (ref 0.0–0.7)
Eosinophils Relative: 4.5 % (ref 0.0–5.0)
HCT: 41 % (ref 36.0–46.0)
Hemoglobin: 14.1 g/dL (ref 12.0–15.0)
Lymphocytes Relative: 28.6 % (ref 12.0–46.0)
Lymphs Abs: 2.1 10*3/uL (ref 0.7–4.0)
MCHC: 34.3 g/dL (ref 30.0–36.0)
MCV: 99.7 fl (ref 78.0–100.0)
Monocytes Absolute: 0.8 10*3/uL (ref 0.1–1.0)
Monocytes Relative: 10.3 % (ref 3.0–12.0)
Neutro Abs: 4.1 10*3/uL (ref 1.4–7.7)
Neutrophils Relative %: 55.6 % (ref 43.0–77.0)
Platelets: 218 10*3/uL (ref 150.0–400.0)
RBC: 4.12 Mil/uL (ref 3.87–5.11)
RDW: 13.5 % (ref 11.5–15.5)
WBC: 7.4 10*3/uL (ref 4.0–10.5)

## 2019-01-26 LAB — LIPID PANEL
Cholesterol: 213 mg/dL — ABNORMAL HIGH (ref 0–200)
HDL: 65.1 mg/dL (ref >39.00)
NonHDL: 148.33
Total CHOL/HDL Ratio: 3
Triglycerides: 313 mg/dL — ABNORMAL HIGH (ref 0.0–149.0)
VLDL: 62.6 mg/dL — ABNORMAL HIGH (ref 0.0–40.0)

## 2019-01-26 LAB — HEPATIC FUNCTION PANEL
ALT: 92 U/L — ABNORMAL HIGH (ref 0–35)
AST: 64 U/L — ABNORMAL HIGH (ref 0–37)
Albumin: 4.5 g/dL (ref 3.5–5.2)
Alkaline Phosphatase: 62 U/L (ref 39–117)
Bilirubin, Direct: 0.1 mg/dL (ref 0.0–0.3)
Total Bilirubin: 0.6 mg/dL (ref 0.2–1.2)
Total Protein: 6.6 g/dL (ref 6.0–8.3)

## 2019-01-26 LAB — BASIC METABOLIC PANEL
BUN: 9 mg/dL (ref 6–23)
CO2: 30 mEq/L (ref 19–32)
Calcium: 9.6 mg/dL (ref 8.4–10.5)
Chloride: 104 mEq/L (ref 96–112)
Creatinine, Ser: 0.89 mg/dL (ref 0.40–1.20)
GFR: 63.2 mL/min (ref >60.00)
Glucose, Bld: 105 mg/dL — ABNORMAL HIGH (ref 70–99)
Potassium: 4.2 mEq/L (ref 3.5–5.1)
Sodium: 139 mEq/L (ref 135–145)

## 2019-01-26 LAB — LDL CHOLESTEROL, DIRECT: Direct LDL: 114 mg/dL

## 2019-01-26 LAB — HEMOGLOBIN A1C: Hgb A1c MFr Bld: 5.5 % (ref 4.6–6.5)

## 2019-01-26 LAB — SARS-COV-2 IGG: SARS-COV-2 IgG: 0.03

## 2019-02-01 ENCOUNTER — Other Ambulatory Visit: Payer: Self-pay | Admitting: Internal Medicine

## 2019-02-01 DIAGNOSIS — R945 Abnormal results of liver function studies: Secondary | ICD-10-CM

## 2019-02-01 DIAGNOSIS — R7989 Other specified abnormal findings of blood chemistry: Secondary | ICD-10-CM

## 2019-02-01 NOTE — Progress Notes (Signed)
Order placed for f/u lab.   

## 2019-02-05 ENCOUNTER — Other Ambulatory Visit: Payer: Medicare Other

## 2019-02-28 DIAGNOSIS — M7521 Bicipital tendinitis, right shoulder: Secondary | ICD-10-CM | POA: Diagnosis not present

## 2019-03-15 ENCOUNTER — Other Ambulatory Visit: Payer: Medicare Other

## 2019-03-18 ENCOUNTER — Ambulatory Visit: Payer: Medicare Other

## 2019-03-19 DIAGNOSIS — M7521 Bicipital tendinitis, right shoulder: Secondary | ICD-10-CM | POA: Diagnosis not present

## 2019-03-19 DIAGNOSIS — M25511 Pain in right shoulder: Secondary | ICD-10-CM | POA: Diagnosis not present

## 2019-03-21 ENCOUNTER — Encounter: Payer: Self-pay | Admitting: Internal Medicine

## 2019-03-21 DIAGNOSIS — M7521 Bicipital tendinitis, right shoulder: Secondary | ICD-10-CM | POA: Diagnosis not present

## 2019-03-21 DIAGNOSIS — M7551 Bursitis of right shoulder: Secondary | ICD-10-CM | POA: Diagnosis not present

## 2019-03-21 DIAGNOSIS — S46011A Strain of muscle(s) and tendon(s) of the rotator cuff of right shoulder, initial encounter: Secondary | ICD-10-CM | POA: Diagnosis not present

## 2019-03-23 ENCOUNTER — Ambulatory Visit: Payer: Medicare Other

## 2019-03-26 ENCOUNTER — Encounter: Payer: Self-pay | Admitting: Internal Medicine

## 2019-03-26 ENCOUNTER — Other Ambulatory Visit: Payer: Self-pay

## 2019-03-26 NOTE — Progress Notes (Signed)
Last OV 01/16/19 Next OV 05/21/19 Last refill 01/22/19  rx ok'd for xanax #30 with one refill.

## 2019-03-27 MED ORDER — ALPRAZOLAM 0.25 MG PO TABS: ORAL_TABLET | ORAL | 1 refills | Status: DC

## 2019-04-02 DIAGNOSIS — Z20822 Contact with and (suspected) exposure to covid-19: Secondary | ICD-10-CM | POA: Diagnosis not present

## 2019-04-02 DIAGNOSIS — Z01818 Encounter for other preprocedural examination: Secondary | ICD-10-CM | POA: Diagnosis not present

## 2019-04-02 DIAGNOSIS — D1801 Hemangioma of skin and subcutaneous tissue: Secondary | ICD-10-CM | POA: Diagnosis not present

## 2019-04-02 DIAGNOSIS — D2270 Melanocytic nevi of unspecified lower limb, including hip: Secondary | ICD-10-CM | POA: Diagnosis not present

## 2019-04-02 DIAGNOSIS — D2260 Melanocytic nevi of unspecified upper limb, including shoulder: Secondary | ICD-10-CM | POA: Diagnosis not present

## 2019-04-02 DIAGNOSIS — D225 Melanocytic nevi of trunk: Secondary | ICD-10-CM | POA: Diagnosis not present

## 2019-04-02 DIAGNOSIS — S46911A Strain of unspecified muscle, fascia and tendon at shoulder and upper arm level, right arm, initial encounter: Secondary | ICD-10-CM | POA: Diagnosis not present

## 2019-04-02 DIAGNOSIS — Z01812 Encounter for preprocedural laboratory examination: Secondary | ICD-10-CM | POA: Diagnosis not present

## 2019-04-03 DIAGNOSIS — M7521 Bicipital tendinitis, right shoulder: Secondary | ICD-10-CM | POA: Diagnosis not present

## 2019-04-03 DIAGNOSIS — Z87891 Personal history of nicotine dependence: Secondary | ICD-10-CM | POA: Diagnosis not present

## 2019-04-03 DIAGNOSIS — Z881 Allergy status to other antibiotic agents status: Secondary | ICD-10-CM | POA: Diagnosis not present

## 2019-04-03 DIAGNOSIS — M659 Synovitis and tenosynovitis, unspecified: Secondary | ICD-10-CM | POA: Diagnosis not present

## 2019-04-03 DIAGNOSIS — M75111 Incomplete rotator cuff tear or rupture of right shoulder, not specified as traumatic: Secondary | ICD-10-CM | POA: Diagnosis not present

## 2019-04-03 DIAGNOSIS — M25511 Pain in right shoulder: Secondary | ICD-10-CM | POA: Diagnosis not present

## 2019-04-03 DIAGNOSIS — K219 Gastro-esophageal reflux disease without esophagitis: Secondary | ICD-10-CM | POA: Diagnosis not present

## 2019-04-03 DIAGNOSIS — M24111 Other articular cartilage disorders, right shoulder: Secondary | ICD-10-CM | POA: Diagnosis not present

## 2019-04-03 DIAGNOSIS — M75101 Unspecified rotator cuff tear or rupture of right shoulder, not specified as traumatic: Secondary | ICD-10-CM | POA: Diagnosis not present

## 2019-04-03 DIAGNOSIS — M778 Other enthesopathies, not elsewhere classified: Secondary | ICD-10-CM | POA: Diagnosis not present

## 2019-04-03 DIAGNOSIS — M7551 Bursitis of right shoulder: Secondary | ICD-10-CM | POA: Diagnosis not present

## 2019-04-03 DIAGNOSIS — M75121 Complete rotator cuff tear or rupture of right shoulder, not specified as traumatic: Secondary | ICD-10-CM | POA: Diagnosis not present

## 2019-04-03 DIAGNOSIS — F329 Major depressive disorder, single episode, unspecified: Secondary | ICD-10-CM | POA: Diagnosis not present

## 2019-04-03 DIAGNOSIS — G8918 Other acute postprocedural pain: Secondary | ICD-10-CM | POA: Diagnosis not present

## 2019-04-03 DIAGNOSIS — S46911A Strain of unspecified muscle, fascia and tendon at shoulder and upper arm level, right arm, initial encounter: Secondary | ICD-10-CM | POA: Diagnosis not present

## 2019-04-03 DIAGNOSIS — M19011 Primary osteoarthritis, right shoulder: Secondary | ICD-10-CM | POA: Diagnosis not present

## 2019-04-03 DIAGNOSIS — S46011A Strain of muscle(s) and tendon(s) of the rotator cuff of right shoulder, initial encounter: Secondary | ICD-10-CM | POA: Diagnosis not present

## 2019-04-05 ENCOUNTER — Ambulatory Visit: Payer: Medicare Other

## 2019-04-08 ENCOUNTER — Ambulatory Visit: Payer: Medicare Other

## 2019-04-10 ENCOUNTER — Other Ambulatory Visit: Payer: Medicare Other

## 2019-04-16 DIAGNOSIS — M6281 Muscle weakness (generalized): Secondary | ICD-10-CM | POA: Diagnosis not present

## 2019-04-16 DIAGNOSIS — M25611 Stiffness of right shoulder, not elsewhere classified: Secondary | ICD-10-CM | POA: Diagnosis not present

## 2019-04-19 DIAGNOSIS — M6281 Muscle weakness (generalized): Secondary | ICD-10-CM | POA: Diagnosis not present

## 2019-04-19 DIAGNOSIS — M25611 Stiffness of right shoulder, not elsewhere classified: Secondary | ICD-10-CM | POA: Diagnosis not present

## 2019-04-20 ENCOUNTER — Other Ambulatory Visit (INDEPENDENT_AMBULATORY_CARE_PROVIDER_SITE_OTHER): Payer: Medicare Other

## 2019-04-20 ENCOUNTER — Encounter: Payer: Self-pay | Admitting: Internal Medicine

## 2019-04-20 ENCOUNTER — Other Ambulatory Visit: Payer: Self-pay

## 2019-04-20 DIAGNOSIS — R7989 Other specified abnormal findings of blood chemistry: Secondary | ICD-10-CM

## 2019-04-20 DIAGNOSIS — R945 Abnormal results of liver function studies: Secondary | ICD-10-CM | POA: Diagnosis not present

## 2019-04-20 NOTE — Addendum Note (Signed)
Addended by: Leeanne Rio on: 04/20/2019 02:58 PM   Modules accepted: Orders

## 2019-04-21 LAB — HEPATIC FUNCTION PANEL
ALT: 41 IU/L — ABNORMAL HIGH (ref 0–32)
AST: 27 IU/L (ref 0–40)
Albumin: 4.4 g/dL (ref 3.8–4.8)
Alkaline Phosphatase: 74 IU/L (ref 39–117)
Bilirubin Total: 0.3 mg/dL (ref 0.0–1.2)
Bilirubin, Direct: 0.12 mg/dL (ref 0.00–0.40)
Total Protein: 6.8 g/dL (ref 6.0–8.5)

## 2019-04-24 ENCOUNTER — Other Ambulatory Visit: Payer: Self-pay | Admitting: Internal Medicine

## 2019-04-24 DIAGNOSIS — M6281 Muscle weakness (generalized): Secondary | ICD-10-CM | POA: Diagnosis not present

## 2019-04-24 DIAGNOSIS — M25611 Stiffness of right shoulder, not elsewhere classified: Secondary | ICD-10-CM | POA: Diagnosis not present

## 2019-04-30 DIAGNOSIS — M25611 Stiffness of right shoulder, not elsewhere classified: Secondary | ICD-10-CM | POA: Diagnosis not present

## 2019-04-30 DIAGNOSIS — M6281 Muscle weakness (generalized): Secondary | ICD-10-CM | POA: Diagnosis not present

## 2019-05-03 DIAGNOSIS — M6281 Muscle weakness (generalized): Secondary | ICD-10-CM | POA: Diagnosis not present

## 2019-05-03 DIAGNOSIS — M25611 Stiffness of right shoulder, not elsewhere classified: Secondary | ICD-10-CM | POA: Diagnosis not present

## 2019-05-07 DIAGNOSIS — M6281 Muscle weakness (generalized): Secondary | ICD-10-CM | POA: Diagnosis not present

## 2019-05-07 DIAGNOSIS — M25611 Stiffness of right shoulder, not elsewhere classified: Secondary | ICD-10-CM | POA: Diagnosis not present

## 2019-05-10 DIAGNOSIS — M6281 Muscle weakness (generalized): Secondary | ICD-10-CM | POA: Diagnosis not present

## 2019-05-10 DIAGNOSIS — M25611 Stiffness of right shoulder, not elsewhere classified: Secondary | ICD-10-CM | POA: Diagnosis not present

## 2019-05-14 DIAGNOSIS — M6281 Muscle weakness (generalized): Secondary | ICD-10-CM | POA: Diagnosis not present

## 2019-05-14 DIAGNOSIS — M25611 Stiffness of right shoulder, not elsewhere classified: Secondary | ICD-10-CM | POA: Diagnosis not present

## 2019-05-17 DIAGNOSIS — M6281 Muscle weakness (generalized): Secondary | ICD-10-CM | POA: Diagnosis not present

## 2019-05-17 DIAGNOSIS — M25611 Stiffness of right shoulder, not elsewhere classified: Secondary | ICD-10-CM | POA: Diagnosis not present

## 2019-05-18 ENCOUNTER — Other Ambulatory Visit: Payer: Medicare Other

## 2019-05-21 ENCOUNTER — Other Ambulatory Visit: Payer: Self-pay

## 2019-05-21 ENCOUNTER — Ambulatory Visit: Payer: Medicare Other | Admitting: Internal Medicine

## 2019-05-21 ENCOUNTER — Encounter: Payer: Self-pay | Admitting: Internal Medicine

## 2019-05-21 DIAGNOSIS — R1030 Lower abdominal pain, unspecified: Secondary | ICD-10-CM

## 2019-05-21 DIAGNOSIS — F439 Reaction to severe stress, unspecified: Secondary | ICD-10-CM

## 2019-05-21 DIAGNOSIS — H02883 Meibomian gland dysfunction of right eye, unspecified eyelid: Secondary | ICD-10-CM | POA: Diagnosis not present

## 2019-05-21 DIAGNOSIS — Z961 Presence of intraocular lens: Secondary | ICD-10-CM | POA: Diagnosis not present

## 2019-05-21 DIAGNOSIS — E7800 Pure hypercholesterolemia, unspecified: Secondary | ICD-10-CM

## 2019-05-21 DIAGNOSIS — K219 Gastro-esophageal reflux disease without esophagitis: Secondary | ICD-10-CM

## 2019-05-21 DIAGNOSIS — R7989 Other specified abnormal findings of blood chemistry: Secondary | ICD-10-CM

## 2019-05-21 DIAGNOSIS — Z1211 Encounter for screening for malignant neoplasm of colon: Secondary | ICD-10-CM

## 2019-05-21 DIAGNOSIS — N281 Cyst of kidney, acquired: Secondary | ICD-10-CM

## 2019-05-21 DIAGNOSIS — H04123 Dry eye syndrome of bilateral lacrimal glands: Secondary | ICD-10-CM | POA: Diagnosis not present

## 2019-05-21 DIAGNOSIS — H02885 Meibomian gland dysfunction left lower eyelid: Secondary | ICD-10-CM | POA: Diagnosis not present

## 2019-05-21 DIAGNOSIS — R739 Hyperglycemia, unspecified: Secondary | ICD-10-CM

## 2019-05-21 DIAGNOSIS — R945 Abnormal results of liver function studies: Secondary | ICD-10-CM | POA: Diagnosis not present

## 2019-05-21 MED ORDER — PANTOPRAZOLE SODIUM 40 MG PO TBEC: DELAYED_RELEASE_TABLET | ORAL | 1 refills | Status: DC

## 2019-05-21 NOTE — Progress Notes (Signed)
[name], female   DOB: 1950/06/26, 69 y.o.   MRN: [id number]   Subjective:    [name], female    DOB: February 06, 1951, 69 y.o.   MRN: [id number]  HPI This visit occurred during the SARS-CoV-2 public health emergency.  Safety protocols were in place, including screening questions prior to the visit, additional usage of staff PPE, and extensive cleaning of exam room while observing appropriate contact time as indicated for disinfecting solutions.  Patient here for a scheduled follow up.  She is s/p shoulder surgery.  Out of sling - last week.  Physical therapy.  Continues to f/u with ortho.  Overall she feels she is doing relatively well.  No chest pain or sob reported.  No acid reflux.  Denies any increased abdominal pain.  Bowels stable.  Has decreased xanax.  Discussed colonoscopy.  Wants to hold for now.  She will call when ready to schedule.  Unable to get insurance to authorize CT abdomen.  She is going to have her plastic surgeon refer her to a urologist to f/u.    Past Medical History:  Diagnosis Date  . Abdominal wall hernia    secondary to large left renal cyst removal  . Atrophic vaginitis   . Colon polyps   . Diverticulosis   . Fibrocystic breast disease   . GERD (gastroesophageal reflux disease)   . Hyperlipidemia   . Postmenopausal   . Sinusitis    Past Surgical History:  Procedure Laterality Date  . ABDOMINAL HYSTERECTOMY  2005   partial  . APPENDECTOMY    . BLEPHAROPLASTY    . Bone Spur     removal - left index finger  . CATARACT EXTRACTION, BILATERAL  2000, 2005  . EXCISION MORTON'S NEUROMA  [id number]   Dr. Albertine Patricia  . HERNIA REPAIR    . lumb hernia    . RENAL CYST EXCISION    . TUBAL LIGATION     Family History  Problem Relation Age of Onset  . Cancer Father        prostate  . Lung cancer Other        parent  . Hypercholesterolemia Other        parent   Social History   Socioeconomic History  . Marital status:  Married    Spouse name: Not on file  . Number of children: 2  . Years of education: Not on file  . Highest education level: Not on file  Occupational History  . Not on file  Tobacco Use  . Smoking status: Former Research scientist (life sciences)  . Smokeless tobacco: Never Used  Substance and Sexual Activity  . Alcohol use: Yes    Alcohol/week: 0.0 standard drinks  . Drug use: No  . Sexual activity: Not on file  Other Topics Concern  . Not on file  Social History Narrative  . Not on file   Social Determinants of Health   Financial Resource Strain: Low Risk   . Difficulty of Paying Living Expenses: Not hard at all  Food Insecurity: No Food Insecurity  . Worried About Charity fundraiser in the Last Year: Never true  . Ran Out of Food in the Last Year: Never true  Transportation Needs: No Transportation Needs  . Lack of Transportation (Medical): No  . Lack of Transportation (Non-Medical): No  Physical Activity: Unknown  . Days of Exercise per Week: 0 days  . Minutes of Exercise per Session: Not on file  Stress: No Stress Concern Present  . Feeling of Stress : Not at all  Social Connections:   . Frequency of Communication with Friends and Family:   . Frequency of Social Gatherings with Friends and Family:   . Attends Religious Services:   . Active Member of Clubs or Organizations:   . Attends Archivist Meetings:   Marland Kitchen Marital Status:     Outpatient Encounter Medications as of 05/21/2019  Medication Sig  . ALPRAZolam (XANAX) 0.25 MG tablet TAKE ONE TABLET EVERY DAY AS NEEDED  . buPROPion (WELLBUTRIN XL) 150 MG 24 hr tablet TAKE THREE TABLETS BY MOUTH EVERY DAY  . Cholecalciferol (VITAMIN D3) 50 MCG (2000 UT) TABS Take by mouth. Soft gels  . fluticasone (FLONASE) 50 MCG/ACT nasal spray PLACE 2 SPRAYS INTO BOTH NOSTRILS DAILY  . MILK THISTLE PO Take by mouth 2 (two) times daily.  . Omega-3 Fatty Acids (FISH OIL PO) Take by mouth daily.  . pantoprazole (PROTONIX) 40 MG tablet TAKE ONE  TABLET BY twice a day  . Turmeric 400 MG CAPS Take by mouth.  . vitamin C (ASCORBIC ACID) 500 MG tablet Take 500 mg by mouth daily.  . [DISCONTINUED] pantoprazole (PROTONIX) 40 MG tablet TAKE ONE TABLET BY twice a day   No facility-administered encounter medications on file as of 05/21/2019.   Review of Systems  Constitutional: Negative for appetite change and unexpected weight change.  HENT: Negative for congestion and sinus pressure.   Respiratory: Negative for cough, chest tightness and shortness of breath.   Cardiovascular: Negative for chest pain, palpitations and leg swelling.  Gastrointestinal: Negative for abdominal pain, diarrhea, nausea and vomiting.  Genitourinary: Negative for difficulty urinating and dysuria.  Musculoskeletal: Negative for joint swelling and myalgias.  Skin: Negative for color change and rash.  Neurological: Negative for dizziness, light-headedness and headaches.  Psychiatric/Behavioral: Negative for agitation and dysphoric mood.       Objective:    Physical Exam Constitutional:      General: She is not in acute distress.    Appearance: Normal appearance.  HENT:     Head: Normocephalic and atraumatic.     Right Ear: External ear normal.     Left Ear: External ear normal.  Eyes:     General: No scleral icterus.       Right eye: No discharge.        Left eye: No discharge.     Conjunctiva/sclera: Conjunctivae normal.  Neck:     Thyroid: No thyromegaly.  Cardiovascular:     Rate and Rhythm: Normal rate and regular rhythm.  Pulmonary:     Effort: No respiratory distress.     Breath sounds: Normal breath sounds. No wheezing.  Abdominal:     General: Bowel sounds are normal.     Palpations: Abdomen is soft.     Tenderness: There is no abdominal tenderness.     Comments: Hernia - lateral abdomen.    Musculoskeletal:        General: No swelling or tenderness.     Cervical back: Neck supple. No tenderness.  Lymphadenopathy:     Cervical: No  cervical adenopathy.  Skin:    Findings: No erythema or rash.  Neurological:     Mental Status: She is alert.  Psychiatric:        Mood and Affect: Mood normal.        Behavior: Behavior normal.     BP 130/78   Pulse 90   Temp (!)  96 F (35.6 C)   Resp 16   Wt 155 lb (70.3 kg)   LMP 02/28/1990   SpO2 97%   BMI 29.29 kg/m  Wt Readings from Last 3 Encounters:  05/21/19 155 lb (70.3 kg)  01/16/19 159 lb (72.1 kg)  11/09/18 159 lb 9.6 oz (72.4 kg)     Lab Results  Component Value Date   WBC 7.4 01/26/2019   HGB 14.1 01/26/2019   HCT 41.0 01/26/2019   PLT 218.0 01/26/2019   GLUCOSE 105 (H) 01/26/2019   CHOL 213 (H) 01/26/2019   TRIG 313.0 (H) 01/26/2019   HDL 65.10 01/26/2019   LDLDIRECT 114.0 01/26/2019   LDLCALC 116 (H) 06/21/2013   ALT 41 (H) 04/20/2019   AST 27 04/20/2019   NA 139 01/26/2019   K 4.2 01/26/2019   CL 104 01/26/2019   CREATININE 0.89 01/26/2019   BUN 9 01/26/2019   CO2 30 01/26/2019   TSH 4.14 11/07/2018   HGBA1C 5.5 01/26/2019       Assessment & Plan:   Problem List Items Addressed This Visit    Abdominal pain    No significant pain on exam.  Sees plastic surgery.  Recommend f/u CT to evaluate renal cyst.  Unable to get insurance authorization.  Discussed again with her today.  She plans to f/u with her surgeon and get urology referral from him.  Will notify me if a problem.        Abnormal liver function test    Previous ultrasound revealed fatty liver.  Evaluated by Dr Allyn Kenner.  Diet.  Exercise.  Follow liver function tests.        Colon cancer screening    Discussed with her today. She prefers to hold on referral right now.  Will notify me when agreeable.        GERD (gastroesophageal reflux disease)    On protonix.  Upper symptoms appear to be controlled.       Relevant Medications   pantoprazole (PROTONIX) 40 MG tablet   Hypercholesterolemia    Low cholesterol diet and exercise.  Follow lipid panel.       Relevant Orders    Hepatic function panel   Lipid panel   TSH   Hyperglycemia    Low carb diet and exercise.  Follow met b and a1c.       Relevant Orders   Hemoglobin V4U   Basic metabolic panel   Renal cyst    Unable to get insurance authorization for f/u renal cyst.  Discussed with her today.  She plans to f/u with her surgeon for urology referral.  Will notify me if a problem.        Stress    On wellbutrin.  Decreased xanax usage.  Follow.  Overall appears to be doing better.           Einar Pheasant, MD

## 2019-05-24 DIAGNOSIS — M25611 Stiffness of right shoulder, not elsewhere classified: Secondary | ICD-10-CM | POA: Diagnosis not present

## 2019-05-24 DIAGNOSIS — M6281 Muscle weakness (generalized): Secondary | ICD-10-CM | POA: Diagnosis not present

## 2019-05-27 ENCOUNTER — Encounter: Payer: Self-pay | Admitting: Internal Medicine

## 2019-05-27 DIAGNOSIS — Z1211 Encounter for screening for malignant neoplasm of colon: Secondary | ICD-10-CM | POA: Insufficient documentation

## 2019-05-27 NOTE — Assessment & Plan Note (Signed)
Unable to get insurance authorization for f/u renal cyst.  Discussed with her today.  She plans to f/u with her surgeon for urology referral.  Will notify me if a problem.

## 2019-05-27 NOTE — Assessment & Plan Note (Signed)
On protonix.  Upper symptoms appear to be controlled.   

## 2019-05-27 NOTE — Assessment & Plan Note (Signed)
On wellbutrin.  Decreased xanax usage.  Follow.  Overall appears to be doing better.

## 2019-05-27 NOTE — Assessment & Plan Note (Signed)
Discussed with her today. She prefers to hold on referral right now.  Will notify me when agreeable.

## 2019-05-27 NOTE — Assessment & Plan Note (Signed)
No significant pain on exam.  Sees plastic surgery.  Recommend f/u CT to evaluate renal cyst.  Unable to get insurance authorization.  Discussed again with her today.  She plans to f/u with her surgeon and get urology referral from him.  Will notify me if a problem.

## 2019-05-27 NOTE — Assessment & Plan Note (Signed)
Low cholesterol diet and exercise.  Follow lipid panel.   

## 2019-05-27 NOTE — Assessment & Plan Note (Signed)
Previous ultrasound revealed fatty liver.  Evaluated by Dr Allyn Kenner.  Diet.  Exercise.  Follow liver function tests.

## 2019-05-27 NOTE — Assessment & Plan Note (Signed)
Low carb diet and exercise.  Follow met b and a1c.  

## 2019-05-29 DIAGNOSIS — M25611 Stiffness of right shoulder, not elsewhere classified: Secondary | ICD-10-CM | POA: Diagnosis not present

## 2019-05-29 DIAGNOSIS — M6281 Muscle weakness (generalized): Secondary | ICD-10-CM | POA: Diagnosis not present

## 2019-06-04 DIAGNOSIS — M25611 Stiffness of right shoulder, not elsewhere classified: Secondary | ICD-10-CM | POA: Diagnosis not present

## 2019-06-04 DIAGNOSIS — M6281 Muscle weakness (generalized): Secondary | ICD-10-CM | POA: Diagnosis not present

## 2019-06-12 DIAGNOSIS — M25611 Stiffness of right shoulder, not elsewhere classified: Secondary | ICD-10-CM | POA: Diagnosis not present

## 2019-06-12 DIAGNOSIS — M6281 Muscle weakness (generalized): Secondary | ICD-10-CM | POA: Diagnosis not present

## 2019-06-26 ENCOUNTER — Other Ambulatory Visit: Payer: Self-pay | Admitting: Internal Medicine

## 2019-07-02 ENCOUNTER — Encounter: Payer: Self-pay | Admitting: Internal Medicine

## 2019-07-02 DIAGNOSIS — K458 Other specified abdominal hernia without obstruction or gangrene: Secondary | ICD-10-CM

## 2019-07-02 DIAGNOSIS — N281 Cyst of kidney, acquired: Secondary | ICD-10-CM

## 2019-07-03 DIAGNOSIS — M6281 Muscle weakness (generalized): Secondary | ICD-10-CM | POA: Diagnosis not present

## 2019-07-03 DIAGNOSIS — M25611 Stiffness of right shoulder, not elsewhere classified: Secondary | ICD-10-CM | POA: Diagnosis not present

## 2019-07-03 NOTE — Telephone Encounter (Signed)
Order placed for CT abdomen and pelvis.  

## 2019-07-04 DIAGNOSIS — M79631 Pain in right forearm: Secondary | ICD-10-CM | POA: Diagnosis not present

## 2019-07-04 DIAGNOSIS — M75121 Complete rotator cuff tear or rupture of right shoulder, not specified as traumatic: Secondary | ICD-10-CM | POA: Diagnosis not present

## 2019-07-12 DIAGNOSIS — M6281 Muscle weakness (generalized): Secondary | ICD-10-CM | POA: Diagnosis not present

## 2019-07-12 DIAGNOSIS — M25611 Stiffness of right shoulder, not elsewhere classified: Secondary | ICD-10-CM | POA: Diagnosis not present

## 2019-07-16 ENCOUNTER — Ambulatory Visit
Admission: RE | Admit: 2019-07-16 | Discharge: 2019-07-16 | Disposition: A | Discharge status: Home / Self Care | Payer: Medicare Other | Source: Ambulatory Visit | Attending: Internal Medicine | Admitting: Internal Medicine

## 2019-07-16 ENCOUNTER — Other Ambulatory Visit: Payer: Self-pay

## 2019-07-16 DIAGNOSIS — N281 Cyst of kidney, acquired: Secondary | ICD-10-CM | POA: Diagnosis not present

## 2019-07-16 DIAGNOSIS — K458 Other specified abdominal hernia without obstruction or gangrene: Secondary | ICD-10-CM

## 2019-07-16 LAB — POCT I-STAT CREATININE: Creatinine, Ser: 0.8 mg/dL (ref 0.44–1.00)

## 2019-07-16 MED ORDER — IOHEXOL 300 MG/ML  SOLN
100.0000 mL | Freq: Once | INTRAMUSCULAR | Status: AC | PRN
  Administered 2019-07-16: 100 mL via INTRAVENOUS

## 2019-07-18 ENCOUNTER — Encounter: Payer: Self-pay | Admitting: Internal Medicine

## 2019-07-18 NOTE — Telephone Encounter (Signed)
ok 

## 2019-07-19 ENCOUNTER — Telehealth (INDEPENDENT_AMBULATORY_CARE_PROVIDER_SITE_OTHER): Payer: Medicare Other | Admitting: Internal Medicine

## 2019-07-19 DIAGNOSIS — R945 Abnormal results of liver function studies: Secondary | ICD-10-CM

## 2019-07-19 DIAGNOSIS — R7989 Other specified abnormal findings of blood chemistry: Secondary | ICD-10-CM

## 2019-07-19 DIAGNOSIS — N281 Cyst of kidney, acquired: Secondary | ICD-10-CM | POA: Diagnosis not present

## 2019-07-19 DIAGNOSIS — Z1211 Encounter for screening for malignant neoplasm of colon: Secondary | ICD-10-CM | POA: Diagnosis not present

## 2019-07-19 DIAGNOSIS — K219 Gastro-esophageal reflux disease without esophagitis: Secondary | ICD-10-CM

## 2019-07-19 NOTE — Progress Notes (Signed)
[name], female   DOB: 09-Aug-1950, 69 y.o.   MRN: [id number]   Virtual Visit via video Note  This visit type was conducted due to national recommendations for restrictions regarding the COVID-19 pandemic (e.g. social distancing).  This format is felt to be most appropriate for this patient at this time.  All issues noted in this document were discussed and addressed.  No physical exam was performed (except for noted visual exam findings with Video Visits).   I connected with Ahlaya Ende by a video enabled telemedicine application and verified that I am speaking with the correct person using two identifiers. Location patient: home Location provider: work Persons participating in the virtual visit: patient, provider  The limitations, risks, security and privacy concerns of performing an evaluation and management service by telephone and the availability of in person appointments have been discussed.  It has also been discussed with the patient that there may be a patient responsible charge related to this service. The patient has expressed understanding and has agreed to proceed.   Reason for visit: work in appt  HPI: Work in appt to discuss CT scan.  Discussed specifics about CT scan.  Questions answered.  She is eating.  No nausea or vomiting.  On protonix for acid reflux.  Some increased belching.  Discussed adjusting timing on protonix and discussed increasing to bid.  Some heartburn at night.  No chest pain or sob reported.  Discussed colonoscopy.     ROS: See pertinent positives and negatives per HPI.  Past Medical History:  Diagnosis Date  . Abdominal wall hernia    secondary to large left renal cyst removal  . Atrophic vaginitis   . Colon polyps   . Diverticulosis   . Fibrocystic breast disease   . GERD (gastroesophageal reflux disease)   . Hyperlipidemia   . Postmenopausal   . Sinusitis     Past Surgical History:  Procedure Laterality Date  . ABDOMINAL  HYSTERECTOMY  2005   partial  . APPENDECTOMY    . BLEPHAROPLASTY    . Bone Spur     removal - left index finger  . CATARACT EXTRACTION, BILATERAL  2000, 2005  . EXCISION MORTON'S NEUROMA  [id number]   Dr. Albertine Patricia  . HERNIA REPAIR    . lumb hernia    . RENAL CYST EXCISION    . TUBAL LIGATION      Family History  Problem Relation Age of Onset  . Cancer Father        prostate  . Lung cancer Other        parent  . Hypercholesterolemia Other        parent    SOCIAL HX: reviewed.    Current Outpatient Medications:  .  ALPRAZolam (XANAX) 0.25 MG tablet, TAKE ONE TABLET EVERY DAY AS NEEDED, Disp: 30 tablet, Rfl: 1 .  buPROPion (WELLBUTRIN XL) 150 MG 24 hr tablet, TAKE THREE TABLETS BY MOUTH ONCE A DAY, Disp: 90 tablet, Rfl: 1 .  Cholecalciferol (VITAMIN D3) 50 MCG (2000 UT) TABS, Take by mouth. Soft gels, Disp: , Rfl:  .  fluticasone (FLONASE) 50 MCG/ACT nasal spray, PLACE 2 SPRAYS INTO BOTH NOSTRILS DAILY, Disp: 16 g, Rfl: 3 .  MILK THISTLE PO, Take by mouth 2 (two) times daily., Disp: , Rfl:  .  Omega-3 Fatty Acids (FISH OIL PO), Take by mouth daily., Disp: , Rfl:  .  pantoprazole (PROTONIX) 40 MG tablet, TAKE ONE TABLET BY twice a  day, Disp: 180 tablet, Rfl: 1 .  Turmeric 400 MG CAPS, Take by mouth., Disp: , Rfl:  .  vitamin C (ASCORBIC ACID) 500 MG tablet, Take 500 mg by mouth daily., Disp: , Rfl:   EXAM:  GENERAL: alert, oriented, appears well and in no acute distress  HEENT: atraumatic, conjunttiva clear, no obvious abnormalities on inspection of external nose and ears  NECK: normal movements of the head and neck  LUNGS: on inspection no signs of respiratory distress, breathing rate appears normal, no obvious gross SOB, gasping or wheezing  CV: no obvious cyanosis  PSYCH/NEURO: pleasant and cooperative, no obvious depression or anxiety, speech and thought processing grossly intact  ASSESSMENT AND PLAN:  Discussed the following assessment and plan:  Renal  cyst CT scan discussed.  Reviewed cysts.  Stable.  Has seen urology.  Send copy of scans to Dr Brendia Sacks.    Abnormal liver function test CT just obtained.  Liver ok.  Follow liver function tests.    Colon cancer screening Wants to f/u with Dr Allen Norris - [facility].  She will call and schedule.  Due colonoscopy.    GERD (gastroesophageal reflux disease) With increased belching.  Will have her take protonix 30 minutes before meals and increase to bid.  Follow.  Notify me if persistent problems.     I discussed the assessment and treatment plan with the patient. The patient was provided an opportunity to ask questions and all were answered. The patient agreed with the plan and demonstrated an understanding of the instructions.   The patient was advised to call back or seek an in-person evaluation if the symptoms worsen or if the condition fails to improve as anticipated.   Einar Pheasant, MD

## 2019-07-25 ENCOUNTER — Encounter: Payer: Self-pay | Admitting: Internal Medicine

## 2019-07-25 NOTE — Assessment & Plan Note (Signed)
CT just obtained.  Liver ok.  Follow liver function tests.

## 2019-07-25 NOTE — Assessment & Plan Note (Signed)
Wants to f/u with Dr Allen Norris - [facility].  She will call and schedule.  Due colonoscopy.

## 2019-07-25 NOTE — Assessment & Plan Note (Signed)
CT scan discussed.  Reviewed cysts.  Stable.  Has seen urology.  Send copy of scans to Dr Brendia Sacks.

## 2019-07-25 NOTE — Assessment & Plan Note (Signed)
With increased belching.  Will have her take protonix 30 minutes before meals and increase to bid.  Follow.  Notify me if persistent problems.

## 2019-07-31 ENCOUNTER — Other Ambulatory Visit: Payer: Self-pay | Admitting: Internal Medicine

## 2019-07-31 DIAGNOSIS — M25611 Stiffness of right shoulder, not elsewhere classified: Secondary | ICD-10-CM | POA: Diagnosis not present

## 2019-07-31 DIAGNOSIS — M6281 Muscle weakness (generalized): Secondary | ICD-10-CM | POA: Diagnosis not present

## 2019-08-16 DIAGNOSIS — M6281 Muscle weakness (generalized): Secondary | ICD-10-CM | POA: Diagnosis not present

## 2019-08-16 DIAGNOSIS — M25611 Stiffness of right shoulder, not elsewhere classified: Secondary | ICD-10-CM | POA: Diagnosis not present

## 2019-08-20 ENCOUNTER — Encounter: Payer: Self-pay | Admitting: Internal Medicine

## 2019-08-20 DIAGNOSIS — K219 Gastro-esophageal reflux disease without esophagitis: Secondary | ICD-10-CM

## 2019-08-20 DIAGNOSIS — Z1211 Encounter for screening for malignant neoplasm of colon: Secondary | ICD-10-CM

## 2019-08-21 NOTE — Telephone Encounter (Signed)
Order placed for GI referral.   

## 2019-08-23 ENCOUNTER — Encounter: Payer: Self-pay | Admitting: Internal Medicine

## 2019-08-26 ENCOUNTER — Encounter: Payer: Self-pay | Admitting: Internal Medicine

## 2019-08-27 NOTE — Telephone Encounter (Signed)
Called patient. Complains of right ear pain. Denies any other symptoms. No fever, chills, cough, congestion, etc. Ear pain since Thursday. Pt scheduled with Maudie Mercury tomorrow morning.

## 2019-08-28 ENCOUNTER — Ambulatory Visit: Payer: Medicare Other | Admitting: Nurse Practitioner

## 2019-08-28 ENCOUNTER — Encounter: Payer: Self-pay | Admitting: Nurse Practitioner

## 2019-08-28 ENCOUNTER — Other Ambulatory Visit: Payer: Self-pay

## 2019-08-28 VITALS — BP 116/70 | HR 92 | Temp 97.6°F | Ht 61.0 in | Wt 157.0 lb

## 2019-08-28 DIAGNOSIS — H9201 Otalgia, right ear: Secondary | ICD-10-CM | POA: Insufficient documentation

## 2019-08-28 NOTE — Patient Instructions (Addendum)
You have a normal ear and neck exam today.  You may have had a degree of eustachian tube dysfunction.  Also be considered pain referring from the teeth, jaw.  Monitor for any rashes we discussed shingles can sometimes present with pain without rash.  If the symptoms continue I would recommend using over the counter Claritin and making an appointment with Dr. Nadeen Landau  in ENT for further evaluation.    Earache, Adult An earache, or ear pain, can be caused by many things, including:  An infection.  Ear wax buildup.  Ear pressure.  Something in the ear that should not be there (foreign body).  A sore throat.  Tooth problems.  Jaw problems. Treatment of the earache will depend on the cause. If the cause is not clear or cannot be determined, you may need to watch your symptoms until your earache goes away or until a cause is found. Follow these instructions at home: Medicines  Take or apply over-the-counter and prescription medicines only as told by your health care provider.  If you were prescribed an antibiotic medicine, use it as told by your health care provider. Do not stop using the antibiotic even if you start to feel better.  Do not put anything in your ear other than medicine that is prescribed by your health care provider. Managing pain If directed, apply heat to the affected area as often as told by your health care provider. Use the heat source that your health care provider recommends, such as a moist heat pack or a heating pad.  Place a towel between your skin and the heat source.  Leave the heat on for 20-30 minutes.  Remove the heat if your skin turns bright red. This is especially important if you are unable to feel pain, heat, or cold. You may have a greater risk of getting burned. If directed, put ice on the affected area as often as told by your health care provider. To do this:      Put ice in a plastic bag.  Place a towel between your skin and the  bag.  Leave the ice on for 20 minutes, 2-3 times a day. General instructions  Pay attention to any changes in your symptoms.  Try resting in an upright position instead of lying down. This may help to reduce pressure in your ear and relieve pain.  Chew gum if it helps to relieve your ear pain.  Treat any allergies as told by your health care provider.  Drink enough fluid to keep your urine pale yellow.  It is up to you to get the results of any tests that were done. Ask your health care provider, or the department that is doing the tests, when your results will be ready.  Keep all follow-up visits as told by your health care provider. This is important. Contact a health care provider if:  Your pain does not improve within 2 days.  Your earache gets worse.  You have new symptoms.  You have a fever. Get help right away if you:  Have a severe headache.  Have a stiff neck.  Have trouble swallowing.  Have redness or swelling behind your ear.  Have fluid or blood coming from your ear.  Have hearing loss.  Feel dizzy. Summary  An earache, or ear pain, can be caused by many things.  Treatment of the earache will depend on the cause. Follow recommendations from your health care provider to treat your ear pain.  If the cause is not clear or cannot be determined, you may need to watch your symptoms until your earache goes away or until a cause is found.  Keep all follow-up visits as told by your health care provider. This is important. This information is not intended to replace advice given to you by your health care provider. Make sure you discuss any questions you have with your health care provider. Document Revised: 09/02/2018 Document Reviewed: 09/02/2018 Elsevier Patient Education  [place].

## 2019-08-28 NOTE — Progress Notes (Signed)
Established Patient Office Visit  Subjective:  [name], female    DOB: [birth year]  Age: 69 y.o. MRN: [id number]  CC:  Chief Complaint  Patient presents with  . Acute Visit    R ear pain    HPI Kara Burns presents for onset of ear pain on last Tuesday described as shooting pains. Kara Burns had it all day Thurs and other days intermittent. It hurt on her right side of the throat when Kara Burns swallowed. No hx of TMJ issues. No swimming. Kara Burns always has nasal congestion and uses nasal saline as needed. No sign of infection. No allergy flairs.   Past Medical History:  Diagnosis Date  . Abdominal wall hernia    secondary to large left renal cyst removal  . Atrophic vaginitis   . Colon polyps   . Diverticulosis   . Fibrocystic breast disease   . GERD (gastroesophageal reflux disease)   . Hyperlipidemia   . Postmenopausal   . Sinusitis     Past Surgical History:  Procedure Laterality Date  . ABDOMINAL HYSTERECTOMY  2005   partial  . APPENDECTOMY    . BLEPHAROPLASTY    . Bone Spur     removal - left index finger  . CATARACT EXTRACTION, BILATERAL  2000, 2005  . EXCISION [facility]  [id number]   Dr. Albertine Patricia  . HERNIA REPAIR    . lumb hernia    . RENAL CYST EXCISION    . TUBAL LIGATION      Family History  Problem Relation Age of Onset  . Cancer Father        prostate  . Lung cancer Other        parent  . Hypercholesterolemia Other        parent    Social History   Socioeconomic History  . Marital status: Married    Spouse name: Not on file  . Number of children: 2  . Years of education: Not on file  . Highest education level: Not on file  Occupational History  . Not on file  Tobacco Use  . Smoking status: Former Research scientist (life sciences)  . Smokeless tobacco: Never Used  Vaping Use  . Vaping Use: Never assessed  Substance and Sexual Activity  . Alcohol use: Yes    Alcohol/week: 0.0 standard drinks  . Drug use: No  . Sexual activity: Not on file    Other Topics Concern  . Not on file  Social History Narrative  . Not on file   Social Determinants of Health   Financial Resource Strain: Low Risk   . Difficulty of Paying Living Expenses: Not hard at all  Food Insecurity: No Food Insecurity  . Worried About Charity fundraiser in the Last Year: Never true  . Ran Out of Food in the Last Year: Never true  Transportation Needs: No Transportation Needs  . Lack of Transportation (Medical): No  . Lack of Transportation (Non-Medical): No  Physical Activity: Unknown  . Days of Exercise per Week: 0 days  . Minutes of Exercise per Session: Not on file  Stress: No Stress Concern Present  . Feeling of Stress : Not at all  Social Connections:   . Frequency of Communication with Friends and Family:   . Frequency of Social Gatherings with Friends and Family:   . Attends Religious Services:   . Active Member of Clubs or Organizations:   . Attends Archivist Meetings:   Marland Kitchen Marital Status:  Intimate Partner Violence: Not At Risk  . Fear of Current or Ex-Partner: No  . Emotionally Abused: No  . Physically Abused: No  . Sexually Abused: No    Outpatient Medications Prior to Visit  Medication Sig Dispense Refill  . ALPRAZolam (XANAX) 0.25 MG tablet TAKE ONE TABLET EVERY DAY AS NEEDED 30 tablet 1  . buPROPion (WELLBUTRIN XL) 150 MG 24 hr tablet TAKE THREE TABLETS BY MOUTH ONCE A DAY 90 tablet 1  . fluticasone (FLONASE) 50 MCG/ACT nasal spray PLACE 2 SPRAYS INTO BOTH NOSTRILS DAILY 16 g 3  . MILK THISTLE PO Take by mouth 2 (two) times daily.    . Omega-3 Fatty Acids (FISH OIL PO) Take by mouth daily.    . pantoprazole (PROTONIX) 40 MG tablet TAKE ONE TABLET BY twice a day 180 tablet 1  . Cholecalciferol (VITAMIN D3) 50 MCG (2000 UT) TABS Take by mouth. Soft gels    . Turmeric 400 MG CAPS Take by mouth.    . vitamin C (ASCORBIC ACID) 500 MG tablet Take 500 mg by mouth daily.     No facility-administered medications prior to visit.     Allergies  Allergen Reactions  . Erythromycin   . Erythromycin Ethylsuccinate    Review of Systems  Constitutional: Negative for chills and fever.  HENT: Positive for ear pain and sore throat. Negative for congestion, dental problem, drooling, ear discharge, facial swelling, hearing loss, mouth sores, nosebleeds, rhinorrhea, sinus pressure, sinus pain, sneezing, tinnitus and trouble swallowing.   Eyes: Negative for discharge and itching.  Respiratory: Negative for cough and shortness of breath.   Cardiovascular: Negative for chest pain.  Neurological: Negative for dizziness, light-headedness and headaches.      Objective:    Physical Exam Vitals reviewed.  Constitutional:      Appearance: Normal appearance.  HENT:     Head: Normocephalic.     Right Ear: Tympanic membrane, ear canal and external ear normal. There is no impacted cerumen.     Left Ear: Tympanic membrane, ear canal and external ear normal. There is no impacted cerumen.     Nose: Nose normal.     Mouth/Throat:     Mouth: Mucous membranes are moist.     Pharynx: Oropharynx is clear.     Comments: Strong gag reflex and after several attempts with and without tongue blade- unable to visualize the post pharynx.   No click or pain right with movement TMJ. No rash. No temporal tenderness.  Eyes:     Conjunctiva/sclera: Conjunctivae normal.     Pupils: Pupils are equal, round, and reactive to light.  Cardiovascular:     Rate and Rhythm: Normal rate and regular rhythm.  Pulmonary:     Effort: Pulmonary effort is normal.     Breath sounds: Normal breath sounds.  Musculoskeletal:     Cervical back: Normal range of motion and neck supple. No tenderness.  Lymphadenopathy:     Cervical: No cervical adenopathy.  Skin:    General: Skin is warm and dry.     Findings: Bruising present. No erythema or rash.  Neurological:     General: No focal deficit present.     Mental Status: Kara Burns is alert and oriented to person,  place, and time.     BP 116/70 (BP Location: Left Arm, Patient Position: Sitting, Cuff Size: Normal)   Pulse 92   Temp 97.6 F (36.4 C) (Oral)   Ht 5\' 1"  (1.549 m)   Wt 157 lb (  71.2 kg)   LMP 02/28/1990   SpO2 97%   BMI 29.66 kg/m  Wt Readings from Last 3 Encounters:  08/28/19 157 lb (71.2 kg)  07/19/19 155 lb (70.3 kg)  05/21/19 155 lb (70.3 kg)     Health Maintenance Due  Topic Date Due  . Hepatitis C Screening  Never done  . TETANUS/TDAP  Never done  . DEXA SCAN  Never done  . COLONOSCOPY  03/08/2019      Assessment & Plan:   Problem List Items Addressed This Visit      Other   Right ear pain - Primary     You have a normal ear and neck exam today.  You may have had a degree of eustachian tube dysfunction.  Also be considered pain referring from the teeth, jaw.  Monitor for any rashes we discussed shingles can sometimes present with pain without rash.  If the symptoms continue I would recommend using over the counter Claritin and making an appointment with Dr. Nadeen Landau  in ENT for further evaluation.  No orders of the defined types were placed in this encounter.  You have a normal ear and neck exam today.  You may have had a degree of eustachian tube dysfunction.  Also be considered pain referring from the teeth, jaw.  Monitor for any rashes we discussed shingles can sometimes present with pain without rash.  If the symptoms continue I would recommend using over the counter Claritin and making an appointment with Dr. Nadeen Landau  in ENT for further evaluation.  Follow-up: Return if symptoms worsen or fail to improve.   This visit occurred during the SARS-CoV-2 public health emergency.  Safety protocols were in place, including screening questions prior to the visit, additional usage of staff PPE, and extensive cleaning of exam room while observing appropriate contact time as indicated for disinfecting solutions.    Denice Paradise, NP

## 2019-08-29 ENCOUNTER — Telehealth: Payer: Medicare Other

## 2019-09-03 ENCOUNTER — Other Ambulatory Visit: Payer: Self-pay | Admitting: Internal Medicine

## 2019-09-03 NOTE — Telephone Encounter (Signed)
rx ok'd for xanax #30 with one refill  

## 2019-10-02 ENCOUNTER — Other Ambulatory Visit: Payer: Self-pay | Admitting: Internal Medicine

## 2019-10-18 ENCOUNTER — Other Ambulatory Visit: Payer: Self-pay

## 2019-10-18 ENCOUNTER — Ambulatory Visit: Payer: Medicare Other | Admitting: Gastroenterology

## 2019-10-18 ENCOUNTER — Encounter: Payer: Self-pay | Admitting: Gastroenterology

## 2019-10-18 VITALS — BP 122/79 | HR 81 | Temp 97.5°F | Ht 61.0 in | Wt 158.2 lb

## 2019-10-18 DIAGNOSIS — R110 Nausea: Secondary | ICD-10-CM

## 2019-10-18 DIAGNOSIS — K219 Gastro-esophageal reflux disease without esophagitis: Secondary | ICD-10-CM | POA: Diagnosis not present

## 2019-10-18 DIAGNOSIS — K2100 Gastro-esophageal reflux disease with esophagitis, without bleeding: Secondary | ICD-10-CM

## 2019-10-18 NOTE — Progress Notes (Signed)
Gastroenterology Consultation  Referring Provider:     Einar Pheasant, MD Primary Care Physician:  Einar Pheasant, MD Primary Gastroenterologist:  Dr. Allen Norris     Reason for Consultation:     Nausea with hiatal hernia and history of colon polyps        HPI:   Kara Burns is a 69 y.o. y/o female referred for consultation & management of Nausea with a hiatal hernia and a history of colon polyps by Dr. Einar Pheasant, MD.  This patient comes in today with a history of having nausea with a history of having a large title hernia.  The patient recently had a CT scan of the abdomen and pelvis that showed a large hiatal hernia.  The patient's last colonoscopy was in 2016 and she was getting them every 5 years because of a history of colon polyps.  She reports that she has bouts of nausea that usually happen in the evening.  She does not associate with any particular foods or with her reflux.  The patient is presently on a PPI and reports that she has very infrequent episodes of breakthrough acid usually less than once a week.  There is no report of any unexplained weight loss fevers chills vomiting black stools or bloody stools. The patient states that she would like to know her options after a upper endoscopy for moving forward whether it be continuing medication or opting for antireflux surgery. The patient comes with her husband today who reports that he is a patient of mine.  Past Medical History:  Diagnosis Date  . Abdominal wall hernia    secondary to large left renal cyst removal  . Atrophic vaginitis   . Colon polyps   . Diverticulosis   . Fibrocystic breast disease   . GERD (gastroesophageal reflux disease)   . Hyperlipidemia   . Postmenopausal   . Sinusitis     Past Surgical History:  Procedure Laterality Date  . ABDOMINAL HYSTERECTOMY  2005   partial  . APPENDECTOMY    . BLEPHAROPLASTY    . Bone Spur     removal - left index finger  . CATARACT EXTRACTION, BILATERAL  2000,  2005  . EXCISION [facility]  [id number]   Dr. Albertine Patricia  . HERNIA REPAIR    . lumb hernia    . RENAL CYST EXCISION    . TUBAL LIGATION      Prior to Admission medications   Medication Sig Start Date End Date Taking? Authorizing Provider  ALPRAZolam (XANAX) 0.25 MG tablet TAKE 1 TABLET BY MOUTH DAILY AS NEEDED 09/03/19  Yes Einar Pheasant, MD  buPROPion (WELLBUTRIN XL) 150 MG 24 hr tablet TAKE THREE TABLETS BY MOUTH ONCE A DAY 10/02/19  Yes Einar Pheasant, MD  cholecalciferol (VITAMIN D3) 25 MCG (1000 UNIT) tablet Take 1,000 Units by mouth daily.   Yes [provider]  fluticasone (FLONASE) 50 MCG/ACT nasal spray PLACE 2 SPRAYS INTO BOTH NOSTRILS DAILY 06/30/18  Yes Einar Pheasant, MD  MILK THISTLE PO Take by mouth 2 (two) times daily.   Yes [provider]  Multiple Vitamins-Minerals (ZINC PO) Take by mouth.   Yes [provider]  Omega-3 Fatty Acids (FISH OIL PO) Take by mouth daily.   Yes [provider]  pantoprazole (PROTONIX) 40 MG tablet TAKE ONE TABLET BY twice a day 05/21/19  Yes Einar Pheasant, MD  Zinc Sulfate (ZINC-220 PO) Take by mouth.   Yes [provider]  Family History  Problem Relation Age of Onset  . Cancer Father        prostate  . Lung cancer Other        parent  . Hypercholesterolemia Other        parent     Social History   Tobacco Use  . Smoking status: Former Research scientist (life sciences)  . Smokeless tobacco: Never Used  Vaping Use  . Vaping Use: Never assessed  Substance Use Topics  . Alcohol use: Yes    Alcohol/week: 0.0 standard drinks  . Drug use: No    Allergies as of 10/18/2019 - Review Complete 10/18/2019  Allergen Reaction Noted  . Erythromycin  02/29/2012  . Erythromycin ethylsuccinate  02/29/2012    Review of Systems:    All systems reviewed and negative except where noted in HPI.   Physical Exam:  BP 122/79   Pulse 81   Temp (!) 97.5 F (36.4 C) (Temporal)   Ht 5\' 1"  (1.549 m)   Wt 158  lb 3.2 oz (71.8 kg)   LMP 02/28/1990   BMI 29.89 kg/m  Patient's last menstrual period was 02/28/1990. General:   Alert,  Well-developed, well-nourished, pleasant and cooperative in NAD Head:  Normocephalic and atraumatic. Eyes:  Sclera clear, no icterus.   Conjunctiva pink. Ears:  Normal auditory acuity. Neck:  Supple; no masses or thyromegaly. Lungs:  Respirations even and unlabored.  Clear throughout to auscultation.   No wheezes, crackles, or rhonchi. No acute distress. Heart:  Regular rate and rhythm; no murmurs, clicks, rubs, or gallops. Abdomen:  Normal bowel sounds.  No bruits.  Soft, non-tender and non-distended without masses, hepatosplenomegaly or hernias noted.  No guarding or rebound tenderness.  Negative Carnett sign.   Rectal:  Deferred.  Pulses:  Normal pulses noted. Extremities:  No clubbing or edema.  No cyanosis. Neurologic:  Alert and oriented x3;  grossly normal neurologically. Skin:  Intact without significant lesions or rashes.  No jaundice. Lymph Nodes:  No significant cervical adenopathy. Psych:  Alert and cooperative. Normal mood and affect.  Imaging Studies: No results found.  Assessment and Plan:   Kara Burns is a 69 y.o. y/o female who comes in today with a history of colon polyps and is in need of a colonoscopy.  The patient had been seen in the past in [place] and prior to that by Dr. Vira Agar. The patient will be set up for a EGD due to her long-standing history of GERD a hiatal hernia and nausea and she will also be set up for colonoscopy for her history of colon polyps.  The patient has been explained the plan and agrees with it.    Lucilla Lame, MD. Marval Regal    Note: This dictation was prepared with Dragon dictation along with smaller phrase technology. Any transcriptional errors that result from this process are unintentional.

## 2019-10-18 NOTE — H&P (View-Only) (Signed)
Gastroenterology Consultation  Referring Provider:     Einar Pheasant, MD Primary Care Physician:  Einar Pheasant, MD Primary Gastroenterologist:  Dr. Allen Norris     Reason for Consultation:     Nausea with hiatal hernia and history of colon polyps        HPI:   Kara Burns is a 69 y.o. y/o female referred for consultation & management of Nausea with a hiatal hernia and a history of colon polyps by Dr. Einar Pheasant, MD.  This patient comes in today with a history of having nausea with a history of having a large title hernia.  The patient recently had a CT scan of the abdomen and pelvis that showed a large hiatal hernia.  The patient's last colonoscopy was in 2016 and she was getting them every 5 years because of a history of colon polyps.  She reports that she has bouts of nausea that usually happen in the evening.  She does not associate with any particular foods or with her reflux.  The patient is presently on a PPI and reports that she has very infrequent episodes of breakthrough acid usually less than once a week.  There is no report of any unexplained weight loss fevers chills vomiting black stools or bloody stools. The patient states that she would like to know her options after a upper endoscopy for moving forward whether it be continuing medication or opting for antireflux surgery. The patient comes with her husband today who reports that he is a patient of mine.  Past Medical History:  Diagnosis Date  . Abdominal wall hernia    secondary to large left renal cyst removal  . Atrophic vaginitis   . Colon polyps   . Diverticulosis   . Fibrocystic breast disease   . GERD (gastroesophageal reflux disease)   . Hyperlipidemia   . Postmenopausal   . Sinusitis     Past Surgical History:  Procedure Laterality Date  . ABDOMINAL HYSTERECTOMY  2005   partial  . APPENDECTOMY    . BLEPHAROPLASTY    . Bone Spur     removal - left index finger  . CATARACT EXTRACTION, BILATERAL  2000,  2005  . EXCISION [facility]  [id number]   Dr. Albertine Patricia  . HERNIA REPAIR    . lumb hernia    . RENAL CYST EXCISION    . TUBAL LIGATION      Prior to Admission medications   Medication Sig Start Date End Date Taking? Authorizing Provider  ALPRAZolam (XANAX) 0.25 MG tablet TAKE 1 TABLET BY MOUTH DAILY AS NEEDED 09/03/19  Yes Einar Pheasant, MD  buPROPion (WELLBUTRIN XL) 150 MG 24 hr tablet TAKE THREE TABLETS BY MOUTH ONCE A DAY 10/02/19  Yes Einar Pheasant, MD  cholecalciferol (VITAMIN D3) 25 MCG (1000 UNIT) tablet Take 1,000 Units by mouth daily.   Yes [provider]  fluticasone (FLONASE) 50 MCG/ACT nasal spray PLACE 2 SPRAYS INTO BOTH NOSTRILS DAILY 06/30/18  Yes Einar Pheasant, MD  MILK THISTLE PO Take by mouth 2 (two) times daily.   Yes [provider]  Multiple Vitamins-Minerals (ZINC PO) Take by mouth.   Yes [provider]  Omega-3 Fatty Acids (FISH OIL PO) Take by mouth daily.   Yes [provider]  pantoprazole (PROTONIX) 40 MG tablet TAKE ONE TABLET BY twice a day 05/21/19  Yes Einar Pheasant, MD  Zinc Sulfate (ZINC-220 PO) Take by mouth.   Yes [provider]  Family History  Problem Relation Age of Onset  . Cancer Father        prostate  . Lung cancer Other        parent  . Hypercholesterolemia Other        parent     Social History   Tobacco Use  . Smoking status: Former Research scientist (life sciences)  . Smokeless tobacco: Never Used  Vaping Use  . Vaping Use: Never assessed  Substance Use Topics  . Alcohol use: Yes    Alcohol/week: 0.0 standard drinks  . Drug use: No    Allergies as of 10/18/2019 - Review Complete 10/18/2019  Allergen Reaction Noted  . Erythromycin  02/29/2012  . Erythromycin ethylsuccinate  02/29/2012    Review of Systems:    All systems reviewed and negative except where noted in HPI.   Physical Exam:  BP 122/79   Pulse 81   Temp (!) 97.5 F (36.4 C) (Temporal)   Ht 5\' 1"  (1.549 m)   Wt 158  lb 3.2 oz (71.8 kg)   LMP 02/28/1990   BMI 29.89 kg/m  Patient's last menstrual period was 02/28/1990. General:   Alert,  Well-developed, well-nourished, pleasant and cooperative in NAD Head:  Normocephalic and atraumatic. Eyes:  Sclera clear, no icterus.   Conjunctiva pink. Ears:  Normal auditory acuity. Neck:  Supple; no masses or thyromegaly. Lungs:  Respirations even and unlabored.  Clear throughout to auscultation.   No wheezes, crackles, or rhonchi. No acute distress. Heart:  Regular rate and rhythm; no murmurs, clicks, rubs, or gallops. Abdomen:  Normal bowel sounds.  No bruits.  Soft, non-tender and non-distended without masses, hepatosplenomegaly or hernias noted.  No guarding or rebound tenderness.  Negative Carnett sign.   Rectal:  Deferred.  Pulses:  Normal pulses noted. Extremities:  No clubbing or edema.  No cyanosis. Neurologic:  Alert and oriented x3;  grossly normal neurologically. Skin:  Intact without significant lesions or rashes.  No jaundice. Lymph Nodes:  No significant cervical adenopathy. Psych:  Alert and cooperative. Normal mood and affect.  Imaging Studies: No results found.  Assessment and Plan:   Kara Burns is a 69 y.o. y/o female who comes in today with a history of colon polyps and is in need of a colonoscopy.  The patient had been seen in the past in [place] and prior to that by Dr. Vira Agar. The patient will be set up for a EGD due to her long-standing history of GERD a hiatal hernia and nausea and she will also be set up for colonoscopy for her history of colon polyps.  The patient has been explained the plan and agrees with it.    Lucilla Lame, MD. Marval Regal    Note: This dictation was prepared with Dragon dictation along with smaller phrase technology. Any transcriptional errors that result from this process are unintentional.

## 2019-10-19 ENCOUNTER — Encounter: Payer: Self-pay | Admitting: Internal Medicine

## 2019-10-19 ENCOUNTER — Encounter: Payer: Self-pay | Admitting: Gastroenterology

## 2019-10-22 ENCOUNTER — Ambulatory Visit: Payer: Medicare Other

## 2019-10-23 ENCOUNTER — Other Ambulatory Visit: Payer: Self-pay

## 2019-10-23 MED ORDER — PEG 3350-KCL-NA BICARB-NACL 420 G PO SOLR
ORAL | 0 refills | Status: DC
Start: 2019-10-23 — End: 2019-11-29

## 2019-10-24 ENCOUNTER — Other Ambulatory Visit: Payer: Self-pay

## 2019-10-24 ENCOUNTER — Other Ambulatory Visit
Admission: RE | Admit: 2019-10-24 | Discharge: 2019-10-24 | Disposition: A | Discharge status: Home / Self Care | Payer: Medicare Other | Source: Ambulatory Visit | Attending: Gastroenterology | Admitting: Gastroenterology

## 2019-10-24 DIAGNOSIS — Z20822 Contact with and (suspected) exposure to covid-19: Secondary | ICD-10-CM | POA: Insufficient documentation

## 2019-10-24 DIAGNOSIS — Z01812 Encounter for preprocedural laboratory examination: Secondary | ICD-10-CM | POA: Diagnosis not present

## 2019-10-24 MED ORDER — SUTAB 1479-225-188 MG PO TABS: 376.0000 mg | ORAL_TABLET | ORAL | 0 refills | Status: DC

## 2019-10-25 LAB — SARS CORONAVIRUS 2 (TAT 6-24 HRS): SARS Coronavirus 2: NEGATIVE

## 2019-10-25 NOTE — Discharge Instructions (Signed)
General Anesthesia, Adult, Care After This sheet gives you information about how to care for yourself after your procedure. Your health care provider may also give you more specific instructions. If you have problems or questions, contact your health care provider. What can I expect after the procedure? After the procedure, the following side effects are common:  Pain or discomfort at the IV site.  Nausea.  Vomiting.  Sore throat.  Trouble concentrating.  Feeling cold or chills.  Weak or tired.  Sleepiness and fatigue.  Soreness and body aches. These side effects can affect parts of the body that were not involved in surgery. Follow these instructions at home:  For at least 24 hours after the procedure:  Have a responsible adult stay with you. It is important to have someone help care for you until you are awake and alert.  Rest as needed.  Do not: ? Participate in activities in which you could fall or become injured. ? Drive. ? Use heavy machinery. ? Drink alcohol. ? Take sleeping pills or medicines that cause drowsiness. ? Make important decisions or sign legal documents. ? Take care of children on your own. Eating and drinking  Follow any instructions from your health care provider about eating or drinking restrictions.  When you feel hungry, start by eating small amounts of foods that are soft and easy to digest (bland), such as toast. Gradually return to your regular diet.  Drink enough fluid to keep your urine pale yellow.  If you vomit, rehydrate by drinking water, juice, or clear broth. General instructions  If you have sleep apnea, surgery and certain medicines can increase your risk for breathing problems. Follow instructions from your health care provider about wearing your sleep device: ? Anytime you are sleeping, including during daytime naps. ? While taking prescription pain medicines, sleeping medicines, or medicines that make you drowsy.  Return to  your normal activities as told by your health care provider. Ask your health care provider what activities are safe for you.  Take over-the-counter and prescription medicines only as told by your health care provider.  If you smoke, do not smoke without supervision.  Keep all follow-up visits as told by your health care provider. This is important. Contact a health care provider if:  You have nausea or vomiting that does not get better with medicine.  You cannot eat or drink without vomiting.  You have pain that does not get better with medicine.  You are unable to pass urine.  You develop a skin rash.  You have a fever.  You have redness around your IV site that gets worse. Get help right away if:  You have difficulty breathing.  You have chest pain.  You have blood in your urine or stool, or you vomit blood. Summary  After the procedure, it is common to have a sore throat or nausea. It is also common to feel tired.  Have a responsible adult stay with you for the first 24 hours after general anesthesia. It is important to have someone help care for you until you are awake and alert.  When you feel hungry, start by eating small amounts of foods that are soft and easy to digest (bland), such as toast. Gradually return to your regular diet.  Drink enough fluid to keep your urine pale yellow.  Return to your normal activities as told by your health care provider. Ask your health care provider what activities are safe for you. This information is not   intended to replace advice given to you by your health care provider. Make sure you discuss any questions you have with your health care provider. Document Revised: 01/28/2017 Document Reviewed: 09/10/2016 Elsevier Patient Education  2020 [employer]  

## 2019-10-26 ENCOUNTER — Ambulatory Visit
Admission: RE | Admit: 2019-10-26 | Discharge: 2019-10-26 | Disposition: A | Discharge status: Home / Self Care | Payer: Medicare Other | Attending: Gastroenterology | Admitting: Gastroenterology

## 2019-10-26 ENCOUNTER — Encounter: Payer: Self-pay | Admitting: Gastroenterology

## 2019-10-26 ENCOUNTER — Ambulatory Visit: Payer: Medicare Other | Admitting: Anesthesiology

## 2019-10-26 ENCOUNTER — Encounter
Admission: RE | Disposition: A | Discharge status: Home / Self Care | Payer: Self-pay | Source: Home / Self Care | Attending: Gastroenterology

## 2019-10-26 ENCOUNTER — Other Ambulatory Visit: Payer: Self-pay

## 2019-10-26 DIAGNOSIS — D124 Benign neoplasm of descending colon: Secondary | ICD-10-CM | POA: Diagnosis not present

## 2019-10-26 DIAGNOSIS — K5730 Diverticulosis of large intestine without perforation or abscess without bleeding: Secondary | ICD-10-CM | POA: Diagnosis not present

## 2019-10-26 DIAGNOSIS — Z9841 Cataract extraction status, right eye: Secondary | ICD-10-CM | POA: Insufficient documentation

## 2019-10-26 DIAGNOSIS — K2900 Acute gastritis without bleeding: Secondary | ICD-10-CM | POA: Diagnosis not present

## 2019-10-26 DIAGNOSIS — K449 Diaphragmatic hernia without obstruction or gangrene: Secondary | ICD-10-CM | POA: Insufficient documentation

## 2019-10-26 DIAGNOSIS — Z801 Family history of malignant neoplasm of trachea, bronchus and lung: Secondary | ICD-10-CM | POA: Diagnosis not present

## 2019-10-26 DIAGNOSIS — K219 Gastro-esophageal reflux disease without esophagitis: Secondary | ICD-10-CM | POA: Insufficient documentation

## 2019-10-26 DIAGNOSIS — K222 Esophageal obstruction: Secondary | ICD-10-CM | POA: Diagnosis not present

## 2019-10-26 DIAGNOSIS — Z87891 Personal history of nicotine dependence: Secondary | ICD-10-CM | POA: Insufficient documentation

## 2019-10-26 DIAGNOSIS — K641 Second degree hemorrhoids: Secondary | ICD-10-CM | POA: Diagnosis not present

## 2019-10-26 DIAGNOSIS — Z79899 Other long term (current) drug therapy: Secondary | ICD-10-CM | POA: Diagnosis not present

## 2019-10-26 DIAGNOSIS — Z8042 Family history of malignant neoplasm of prostate: Secondary | ICD-10-CM | POA: Diagnosis not present

## 2019-10-26 DIAGNOSIS — K2960 Other gastritis without bleeding: Secondary | ICD-10-CM | POA: Insufficient documentation

## 2019-10-26 DIAGNOSIS — K635 Polyp of colon: Secondary | ICD-10-CM

## 2019-10-26 DIAGNOSIS — Z6829 Body mass index (BMI) 29.0-29.9, adult: Secondary | ICD-10-CM | POA: Diagnosis not present

## 2019-10-26 DIAGNOSIS — Z9842 Cataract extraction status, left eye: Secondary | ICD-10-CM | POA: Insufficient documentation

## 2019-10-26 DIAGNOSIS — R12 Heartburn: Secondary | ICD-10-CM | POA: Diagnosis not present

## 2019-10-26 DIAGNOSIS — M1990 Unspecified osteoarthritis, unspecified site: Secondary | ICD-10-CM | POA: Diagnosis not present

## 2019-10-26 DIAGNOSIS — Z860100 Personal history of colon polyps, unspecified: Secondary | ICD-10-CM

## 2019-10-26 DIAGNOSIS — Z90710 Acquired absence of both cervix and uterus: Secondary | ICD-10-CM | POA: Insufficient documentation

## 2019-10-26 DIAGNOSIS — Z8249 Family history of ischemic heart disease and other diseases of the circulatory system: Secondary | ICD-10-CM | POA: Diagnosis not present

## 2019-10-26 DIAGNOSIS — Z1211 Encounter for screening for malignant neoplasm of colon: Secondary | ICD-10-CM | POA: Insufficient documentation

## 2019-10-26 DIAGNOSIS — E785 Hyperlipidemia, unspecified: Secondary | ICD-10-CM | POA: Insufficient documentation

## 2019-10-26 DIAGNOSIS — F419 Anxiety disorder, unspecified: Secondary | ICD-10-CM | POA: Diagnosis not present

## 2019-10-26 HISTORY — PX: COLONOSCOPY WITH PROPOFOL: SHX5780

## 2019-10-26 HISTORY — DX: Carpal tunnel syndrome, unspecified upper limb: G56.00

## 2019-10-26 HISTORY — DX: Other complications of anesthesia, initial encounter: T88.59XA

## 2019-10-26 HISTORY — PX: ESOPHAGOGASTRODUODENOSCOPY (EGD) WITH PROPOFOL: SHX5813

## 2019-10-26 HISTORY — DX: Personal history of other diseases of the digestive system: Z87.19

## 2019-10-26 HISTORY — DX: Motion sickness, initial encounter: T75.3XXA

## 2019-10-26 SURGERY — COLONOSCOPY WITH PROPOFOL
Anesthesia: General

## 2019-10-26 MED ORDER — LIDOCAINE HCL (CARDIAC) PF 100 MG/5ML IV SOSY
PREFILLED_SYRINGE | INTRAVENOUS | Status: DC | PRN
  Administered 2019-10-26: 30 mg via INTRAVENOUS

## 2019-10-26 MED ORDER — GLYCOPYRROLATE 0.2 MG/ML IJ SOLN
INTRAMUSCULAR | Status: DC | PRN
  Administered 2019-10-26: .1 mg via INTRAVENOUS

## 2019-10-26 MED ORDER — OXYCODONE HCL 5 MG PO TABS: 5.0000 mg | ORAL_TABLET | Freq: Once | ORAL | Status: DC | PRN

## 2019-10-26 MED ORDER — PROPOFOL 10 MG/ML IV BOLUS
INTRAVENOUS | Status: DC | PRN
  Administered 2019-10-26 (×4): 40 mg via INTRAVENOUS
  Administered 2019-10-26: 50 mg via INTRAVENOUS
  Administered 2019-10-26: 150 mg via INTRAVENOUS
  Administered 2019-10-26: 40 mg via INTRAVENOUS

## 2019-10-26 MED ORDER — STERILE WATER FOR IRRIGATION IR SOLN
Status: DC | PRN
  Administered 2019-10-26: 150 mL

## 2019-10-26 MED ORDER — LACTATED RINGERS IV SOLN: INTRAVENOUS | Status: DC

## 2019-10-26 MED ORDER — OXYCODONE HCL 5 MG/5ML PO SOLN: 5.0000 mg | Freq: Once | ORAL | Status: DC | PRN

## 2019-10-26 SURGICAL SUPPLY — 33 items
BALLN DILATOR 10-12 8 (BALLOONS)
BALLN DILATOR 15-18 8 (BALLOONS) ×2
BALLN DILATOR CRE 0-12 8 (BALLOONS)
BALLOON DILATOR 15-18 8 (BALLOONS) ×1 IMPLANT
BALLOON DILATOR CRE 0-12 8 (BALLOONS) IMPLANT
BLOCK BITE 60FR ADLT L/F GRN (MISCELLANEOUS) ×2 IMPLANT
CLIP HMST 235XBRD CATH ROT (MISCELLANEOUS) IMPLANT
CLIP RESOLUTION 360 11X235 (MISCELLANEOUS)
ELECT REM PT RETURN 9FT ADLT (ELECTROSURGICAL)
ELECTRODE REM PT RTRN 9FT ADLT (ELECTROSURGICAL) IMPLANT
FCP ESCP3.2XJMB 240X2.8X (MISCELLANEOUS)
FORCEPS BIOP RAD 4 LRG CAP 4 (CUTTING FORCEPS) ×2 IMPLANT
FORCEPS BIOP RJ4 240 W/NDL (MISCELLANEOUS)
FORCEPS ESCP3.2XJMB 240X2.8X (MISCELLANEOUS) IMPLANT
GOWN CVR UNV OPN BCK APRN NK (MISCELLANEOUS) ×2 IMPLANT
GOWN ISOL THUMB LOOP REG UNIV (MISCELLANEOUS) ×4
INJECTOR VARIJECT VIN23 (MISCELLANEOUS) IMPLANT
KIT DEFENDO VALVE AND CONN (KITS) IMPLANT
KIT ENDO PROCEDURE OLY (KITS) ×2 IMPLANT
MANIFOLD NEPTUNE II (INSTRUMENTS) ×2 IMPLANT
MARKER SPOT ENDO TATTOO 5ML (MISCELLANEOUS) IMPLANT
PROBE APC STR FIRE (PROBE) IMPLANT
RETRIEVER NET PLAT FOOD (MISCELLANEOUS) IMPLANT
RETRIEVER NET ROTH 2.5X230 LF (MISCELLANEOUS) IMPLANT
SNARE SHORT THROW 13M SML OVAL (MISCELLANEOUS) ×2 IMPLANT
SNARE SHORT THROW 30M LRG OVAL (MISCELLANEOUS) IMPLANT
SNARE SNG USE RND 15MM (INSTRUMENTS) IMPLANT
SPOT EX ENDOSCOPIC TATTOO (MISCELLANEOUS)
SYR INFLATION 60ML (SYRINGE) ×2 IMPLANT
TRAP ETRAP POLY (MISCELLANEOUS) ×2 IMPLANT
VARIJECT INJECTOR VIN23 (MISCELLANEOUS)
WATER STERILE IRR 250ML POUR (IV SOLUTION) ×2 IMPLANT
WIRE CRE 18-20MM 8CM F G (MISCELLANEOUS) IMPLANT

## 2019-10-26 NOTE — Anesthesia Postprocedure Evaluation (Signed)
Anesthesia Post Note  Patient: Kara Burns  Procedure(s) Performed: COLONOSCOPY WITH PROPOFOL (N/A ) ESOPHAGOGASTRODUODENOSCOPY (EGD) WITH PROPOFOL and Dilation (N/A )     Patient location during evaluation: PACU Anesthesia Type: General Level of consciousness: awake and alert Pain management: pain level controlled Vital Signs Assessment: post-procedure vital signs reviewed and stable Respiratory status: spontaneous breathing, nonlabored ventilation, respiratory function stable and patient connected to nasal cannula oxygen Cardiovascular status: blood pressure returned to baseline and stable Postop Assessment: no apparent nausea or vomiting Anesthetic complications: no   No complications documented.  Fidel Levy

## 2019-10-26 NOTE — Op Note (Signed)
[facility] Patient Name: Kara Burns Procedure Date: 10/26/2019 10:10 AM MRN: [id number] Account #: [id number] Date of Birth: Apr 10, 1950 Admit Type: Outpatient Age: 69 Room: [facility] OR ROOM 01 Gender: Female Note Status: Finalized Procedure:             Colonoscopy Indications:           High risk colon cancer surveillance: Personal history                         of colonic polyps Providers:             Lucilla Lame MD, MD Referring MD:          Einar Pheasant, MD (Referring MD) Medicines:             Propofol per Anesthesia Complications:         No immediate complications. Procedure:             Pre-Anesthesia Assessment:                        - Prior to the procedure, a History and Physical was                         performed, and patient medications and allergies were                         reviewed. The patient's tolerance of previous                         anesthesia was also reviewed. The risks and benefits                         of the procedure and the sedation options and risks                         were discussed with the patient. All questions were                         answered, and informed consent was obtained. Prior                         Anticoagulants: The patient has taken no previous                         anticoagulant or antiplatelet agents. ASA Grade                         Assessment: II - A patient with mild systemic disease.                         After reviewing the risks and benefits, the patient                         was deemed in satisfactory condition to undergo the                         procedure.  After obtaining informed consent, the colonoscope was                         passed under direct vision. Throughout the procedure,                         the patient's blood pressure, pulse, and oxygen                         saturations were monitored continuously. The was                          introduced through the anus and advanced to the the                         cecum, identified by appendiceal orifice and ileocecal                         valve. The colonoscopy was performed without                         difficulty. The patient tolerated the procedure well.                         The quality of the bowel preparation was good. Findings:      The perianal and digital rectal examinations were normal.      A 5 mm polyp was found in the descending colon. The polyp was sessile.       The polyp was removed with a cold snare. Resection and retrieval were       complete.      A few small-mouthed diverticula were found in the entire colon.      Non-bleeding internal hemorrhoids were found during retroflexion. The       hemorrhoids were Grade II (internal hemorrhoids that prolapse but reduce       spontaneously). Impression:            - One 5 mm polyp in the descending colon, removed with                         a cold snare. Resected and retrieved.                        - Diverticulosis in the entire examined colon.                        - Non-bleeding internal hemorrhoids. Recommendation:        - Discharge patient to home.                        - Resume previous diet.                        - Continue present medications.                        - Await pathology results.                        - Repeat colonoscopy in 7 years for  surveillance. Procedure Code(s):     --- Professional ---                        [phone], Colonoscopy, flexible; with removal of                         tumor(s), polyp(s), or other lesion(s) by snare                         technique Diagnosis Code(s):     --- Professional ---                        Z86.010, Personal history of colonic polyps                        K63.5, Polyp of colon CPT copyright 2019 American Medical Association. All rights reserved. The codes documented in this report are preliminary and upon coder review  may  be revised to meet current compliance requirements. Lucilla Lame MD, MD 10/26/2019 10:40:43 AM This report has been signed electronically. Number of Addenda: 0 Note Initiated On: 10/26/2019 10:10 AM Scope Withdrawal Time: 0 hours 6 minutes 21 seconds  Total Procedure Duration: 0 hours 8 minutes 53 seconds  Estimated Blood Loss:  Estimated blood loss: none.      [facility]

## 2019-10-26 NOTE — Op Note (Signed)
[facility] Patient Name: Kara Burns Procedure Date: 10/26/2019 10:12 AM MRN: [id number] Account #: [id number] Date of Birth: [birth year] Admit Type: Outpatient Age: 69 Room: [facility] OR ROOM 01 Gender: Female Note Status: Finalized Procedure:             Upper GI endoscopy Indications:           Heartburn Providers:             Lucilla Lame MD, MD Referring MD:          Einar Pheasant, MD (Referring MD) Medicines:             Propofol per Anesthesia Complications:         No immediate complications. Procedure:             Pre-Anesthesia Assessment:                        - Prior to the procedure, a History and Physical was                         performed, and patient medications and allergies were                         reviewed. The patient's tolerance of previous                         anesthesia was also reviewed. The risks and benefits                         of the procedure and the sedation options and risks                         were discussed with the patient. All questions were                         answered, and informed consent was obtained. Prior                         Anticoagulants: The patient has taken no previous                         anticoagulant or antiplatelet agents. ASA Grade                         Assessment: II - A patient with mild systemic disease.                         After reviewing the risks and benefits, the patient                         was deemed in satisfactory condition to undergo the                         procedure.                        After obtaining informed consent, the endoscope was  passed under direct vision. Throughout the procedure,                         the patient's blood pressure, pulse, and oxygen                         saturations were monitored continuously. The was                         introduced through the mouth, and advanced to the                          second part of duodenum. The upper GI endoscopy was                         accomplished without difficulty. The patient tolerated                         the procedure well. Findings:      One benign-appearing, intrinsic mild stenosis was found at the       gastroesophageal junction. The stenosis was traversed. A TTS dilator was       passed through the scope. Dilation with a 15-16.5-18 mm balloon dilator       was performed to 18 mm. The dilation site was examined following       endoscope reinsertion and showed complete resolution of luminal       narrowing.      A small hiatal hernia was present.      Localized moderate inflammation characterized by erosions was found in       the gastric antrum. Biopsies were taken with a cold forceps for       histology.      The examined duodenum was normal. Impression:            - Benign-appearing esophageal stenosis. Dilated.                        - Small hiatal hernia.                        - Gastritis. Biopsied.                        - Normal examined duodenum. Recommendation:        - Discharge patient to home.                        - Resume previous diet.                        - Continue present medications.                        - Await pathology results.                        - Perform a colonoscopy today. Procedure Code(s):     --- Professional ---                        [phone], Esophagogastroduodenoscopy, flexible,  transoral; with transendoscopic balloon dilation of                         esophagus (less than 30 mm diameter)                        43239, 59, Esophagogastroduodenoscopy, flexible,                         transoral; with biopsy, single or multiple Diagnosis Code(s):     --- Professional ---                        R12, Heartburn                        K22.2, Esophageal obstruction                        K29.70, Gastritis, unspecified, without bleeding CPT copyright 2019 American  Medical Association. All rights reserved. The codes documented in this report are preliminary and upon coder review may  be revised to meet current compliance requirements. Lucilla Lame MD, MD 10/26/2019 10:28:18 AM This report has been signed electronically. Number of Addenda: 0 Note Initiated On: 10/26/2019 10:12 AM Total Procedure Duration: 0 hours 5 minutes 53 seconds  Estimated Blood Loss:  Estimated blood loss: none.      [facility]

## 2019-10-26 NOTE — Anesthesia Preprocedure Evaluation (Signed)
Anesthesia Evaluation  Patient identified by MRN, date of birth, ID band Patient awake    Reviewed: NPO status   History of Anesthesia Complications (+) PONV and history of anesthetic complications  Airway Mallampati: II  TM Distance: >3 FB Neck ROM: full    Dental no notable dental hx.    Pulmonary neg pulmonary ROS, former smoker,    Pulmonary exam normal        Cardiovascular Exercise Tolerance: Good negative cardio ROS Normal cardiovascular exam     Neuro/Psych Anxiety negative neurological ROS     GI/Hepatic Neg liver ROS, GERD  Controlled,  Endo/Other  Morbid obesity (bmi 29)  Renal/GU negative Renal ROS  negative genitourinary   Musculoskeletal  (+) Arthritis ,   Abdominal   Peds  Hematology negative hematology ROS (+)   Anesthesia Other Findings Covid: NEG.    Reproductive/Obstetrics                             Anesthesia Physical Anesthesia Plan  ASA: II  Anesthesia Plan: General   Post-op Pain Management:    Induction:   PONV Risk Score and Plan: 3 and TIVA, Propofol infusion and Ondansetron  Airway Management Planned:   Additional Equipment:   Intra-op Plan:   Post-operative Plan:   Informed Consent: I have reviewed the patients History and Physical, chart, labs and discussed the procedure including the risks, benefits and alternatives for the proposed anesthesia with the patient or authorized representative who has indicated his/her understanding and acceptance.       Plan Discussed with: CRNA  Anesthesia Plan Comments:         Anesthesia Quick Evaluation

## 2019-10-26 NOTE — Anesthesia Procedure Notes (Signed)
Date/Time: 10/26/2019 10:16 AM Performed by: Cameron Ali, CRNA Pre-anesthesia Checklist: Patient identified, Emergency Drugs available, Suction available, Timeout performed and Patient being monitored Patient Re-evaluated:Patient Re-evaluated prior to induction Oxygen Delivery Method: Nasal cannula Placement Confirmation: positive ETCO2

## 2019-10-26 NOTE — Transfer of Care (Signed)
Immediate Anesthesia Transfer of Care Note  Patient: Kara Burns  Procedure(s) Performed: COLONOSCOPY WITH PROPOFOL (N/A ) ESOPHAGOGASTRODUODENOSCOPY (EGD) WITH PROPOFOL and Dilation (N/A )  Patient Location: PACU  Anesthesia Type: General  Level of Consciousness: awake, alert  and patient cooperative  Airway and Oxygen Therapy: Patient Spontanous Breathing and Patient connected to supplemental oxygen  Post-op Assessment: Post-op Vital signs reviewed, Patient's Cardiovascular Status Stable, Respiratory Function Stable, Patent Airway and No signs of Nausea or vomiting  Post-op Vital Signs: Reviewed and stable  Complications: No complications documented.

## 2019-10-26 NOTE — Interval H&P Note (Signed)
History and Physical Interval Note:  10/26/2019 9:31 AM  Kara Burns  has presented today for surgery, with the diagnosis of GERD K21.9 Screening Z12.11.  The various methods of treatment have been discussed with the patient and family. After consideration of risks, benefits and other options for treatment, the patient has consented to  Procedure(s): COLONOSCOPY WITH PROPOFOL (N/A) ESOPHAGOGASTRODUODENOSCOPY (EGD) WITH PROPOFOL (N/A) as a surgical intervention.  The patient's history has been reviewed, patient examined, no change in status, stable for surgery.  I have reviewed the patient's chart and labs.  Questions were answered to the patient's satisfaction.     Srah Ake [employer]

## 2019-10-29 ENCOUNTER — Encounter: Payer: Self-pay | Admitting: Gastroenterology

## 2019-10-31 LAB — SURGICAL PATHOLOGY

## 2019-11-05 ENCOUNTER — Encounter: Payer: Self-pay | Admitting: Gastroenterology

## 2019-11-07 ENCOUNTER — Ambulatory Visit (INDEPENDENT_AMBULATORY_CARE_PROVIDER_SITE_OTHER): Payer: Medicare Other

## 2019-11-07 VITALS — Ht 61.0 in | Wt 157.0 lb

## 2019-11-07 DIAGNOSIS — Z0000 Encounter for general adult medical examination without abnormal findings: Secondary | ICD-10-CM

## 2019-11-07 NOTE — Progress Notes (Addendum)
Subjective:   Kara Burns is a 69 y.o. female who presents for Medicare Annual (Subsequent) preventive examination.  Review of Systems    No ROS.  Medicare Wellness Virtual Visit.  Cardiac Risk Factors include: advanced age (>37men, >26 women)     Objective:    Today's Vitals   11/07/19 1100  Weight: 157 lb (71.2 kg)  Height: 5\' 1"  (1.549 m)   Body mass index is 29.66 kg/m.  Advanced Directives 11/07/2019 10/26/2019 10/19/2018  Does Patient Have a Medical Advance Directive? Yes Yes Yes  Type of Paramedic of [place];Living will [place];Living will [place];Living will  Does patient want to make changes to medical advance directive? No - Patient declined No - Patient declined No - Patient declined  Copy of [place] in Chart? No - copy requested No - copy requested No - copy requested    Current Medications (verified) Outpatient Encounter Medications as of 11/07/2019  Medication Sig  . ALPRAZolam (XANAX) 0.25 MG tablet TAKE 1 TABLET BY MOUTH DAILY AS NEEDED  . buPROPion (WELLBUTRIN XL) 150 MG 24 hr tablet TAKE THREE TABLETS BY MOUTH ONCE A DAY  . cholecalciferol (VITAMIN D3) 25 MCG (1000 UNIT) tablet Take 1,000 Units by mouth daily.  . fluticasone (FLONASE) 50 MCG/ACT nasal spray PLACE 2 SPRAYS INTO BOTH NOSTRILS DAILY  . MILK THISTLE PO Take by mouth 2 (two) times daily.  . Multiple Vitamins-Minerals (ZINC PO) Take by mouth. (Patient not taking: Reported on 10/19/2019)  . Omega-3 Fatty Acids (FISH OIL PO) Take by mouth daily.  . pantoprazole (PROTONIX) 40 MG tablet TAKE ONE TABLET BY twice a day  . polyethylene glycol-electrolytes (NULYTELY) 420 g solution Prepare according to package instructions. Starting at 5:00 PM: Drink one 8 oz glass of mixture every 15 minutes until you finish half of the jug. Five hours prior to procedure, drink 8 oz glass of mixture every 15 minutes until it is all gone.  Make sure you do not drink anything 4 hours prior to your procedure.  . Probiotic Product (PROBIOTIC-10) CHEW Chew by mouth daily.  . Sodium Sulfate-Mag Sulfate-KCl (SUTAB) [phone] MG TABS Take 376 mg by mouth as directed.  . Zinc Sulfate (ZINC-220 PO) Take by mouth.   No facility-administered encounter medications on file as of 11/07/2019.    Allergies (verified) Erythromycin and Erythromycin ethylsuccinate   History: Past Medical History:  Diagnosis Date  . Abdominal wall hernia    secondary to large left renal cyst removal  . Arthritis    hands  . Atrophic vaginitis   . Car sickness   . Carpal tunnel syndrome   . Colon polyps   . Complication of anesthesia    slow to wake  . Diverticulosis   . Fibrocystic breast disease   . GERD (gastroesophageal reflux disease)   . History of fatty infiltration of liver   . Hyperlipidemia   . Postmenopausal   . Sinusitis    Past Surgical History:  Procedure Laterality Date  . ABDOMINAL HYSTERECTOMY  2005   partial  . APPENDECTOMY    . BLEPHAROPLASTY    . Bone Spur     removal - left index finger  . CATARACT EXTRACTION, BILATERAL  2000, 2005  . COLONOSCOPY WITH PROPOFOL N/A 10/26/2019   Procedure: COLONOSCOPY WITH PROPOFOL;  Surgeon: Lucilla Lame, MD;  Location: [place];  Service: Endoscopy;  Laterality: N/A;  . ESOPHAGOGASTRODUODENOSCOPY (EGD) WITH PROPOFOL N/A 10/26/2019  Procedure: ESOPHAGOGASTRODUODENOSCOPY (EGD) WITH PROPOFOL and Dilation;  Surgeon: Lucilla Lame, MD;  Location: [place];  Service: Endoscopy;  Laterality: N/A;  . EXCISION [facility]'S NEUROMA  [id number]   Dr. Albertine Patricia  . HERNIA REPAIR    . lumb hernia    . RENAL CYST EXCISION    . TUBAL LIGATION     Family History  Problem Relation Age of Onset  . Cancer Father        prostate  . Lung cancer Other        parent  . Hypercholesterolemia Other        parent   Social History   Socioeconomic History  . Marital status:  Married    Spouse name: Not on file  . Number of children: 2  . Years of education: Not on file  . Highest education level: Not on file  Occupational History  . Not on file  Tobacco Use  . Smoking status: Former Research scientist (life sciences)  . Smokeless tobacco: Never Used  Vaping Use  . Vaping Use: Never assessed  Substance and Sexual Activity  . Alcohol use: Yes    Alcohol/week: 5.0 - 6.0 standard drinks    Types: 5 - 6 Glasses of wine per week  . Drug use: No  . Sexual activity: Not on file  Other Topics Concern  . Not on file  Social History Narrative  . Not on file   Social Determinants of Health   Financial Resource Strain: Low Risk   . Difficulty of Paying Living Expenses: Not hard at all  Food Insecurity: No Food Insecurity  . Worried About Charity fundraiser in the Last Year: Never true  . Ran Out of Food in the Last Year: Never true  Transportation Needs: No Transportation Needs  . Lack of Transportation (Medical): No  . Lack of Transportation (Non-Medical): No  Physical Activity: Sufficiently Active  . Days of Exercise per Week: 5 days  . Minutes of Exercise per Session: 30 min  Stress: No Stress Concern Present  . Feeling of Stress : Not at all  Social Connections:   . Frequency of Communication with Friends and Family: Not on file  . Frequency of Social Gatherings with Friends and Family: Not on file  . Attends Religious Services: Not on file  . Active Member of Clubs or Organizations: Not on file  . Attends Archivist Meetings: Not on file  . Marital Status: Not on file    Tobacco Counseling Counseling given: Not Answered   Clinical Intake:  Pre-visit preparation completed: Yes        Diabetes: No  How often do you need to have someone help you when you read instructions, pamphlets, or other written materials from your doctor or pharmacy?: 1 - Never  Interpreter Needed?: No      Activities of Daily Living In your present state of health, do you  have any difficulty performing the following activities: 11/07/2019 10/26/2019  Hearing? N N  Vision? N N  Difficulty concentrating or making decisions? N N  Walking or climbing stairs? N N  Dressing or bathing? N N  Doing errands, shopping? N -  Preparing Food and eating ? N -  Using the Toilet? N -  In the past six months, have you accidently leaked urine? N -  Do you have problems with loss of bowel control? N -  Managing your Medications? N -  Managing your Finances? N -  Housekeeping or managing  your Housekeeping? N -  Some recent data might be hidden    Patient Care Team: Einar Pheasant, MD as PCP - General (Internal Medicine)  Indicate any recent Medical Services you may have received from other than [facility] providers in the past year (date may be approximate).     Assessment:   This is a routine wellness examination for Kara Burns.  I connected with Myrla today by telephone and verified that I am speaking with the correct person using two identifiers. Location patient: home Location provider: work Persons participating in the virtual visit: patient, Marine scientist.    I discussed the limitations, risks, security and privacy concerns of performing an evaluation and management service by telephone and the availability of in person appointments. The patient expressed understanding and verbally consented to this telephonic visit.    Interactive audio and video telecommunications were attempted between this provider and patient, however failed, due to patient having technical difficulties OR patient did not have access to video capability.  We continued and completed visit with audio only.  Some vital signs may be absent or patient reported.   Hearing/Vision screen  Hearing Screening   125Hz  250Hz  500Hz  1000Hz  2000Hz  3000Hz  4000Hz  6000Hz  8000Hz   Right ear:           Left ear:           Comments: Patient is able to hear conversational tones without difficulty.  No issues  reported.  Vision Screening Comments: Wears corrective lenses  Cataract extraction, bilateral  Visual acuity not assessed, virtual visit. They have seen their ophthalmologist in the last 12 months.   Dietary issues and exercise activities discussed: Current Exercise Habits: Home exercise routine, Type of exercise: walking, Frequency (Times/Week): 3, Intensity: Mild  Goals    . Follow up with primary care provider as needed      Depression Screen PHQ 2/9 Scores 11/07/2019 10/30/2018 10/19/2018 12/30/2015 10/28/2015 10/23/2014 03/05/2013  PHQ - 2 Score 0 0 0 0 0 2 2  PHQ- 9 Score - - - - - 10 4    Fall Risk Fall Risk  11/07/2019 10/30/2018 10/19/2018 12/30/2015 10/28/2015  Falls in the past year? 0 1 0 Yes No  Number falls in past yr: 0 0 - 1 -  Injury with Fall? - 0 - No -  Follow up Falls evaluation completed Falls evaluation completed - - -   Handrails in use when climbing stairs? Yes Home free of loose throw rugs in walkways, pet beds, electrical cords, etc? Yes  Adequate lighting in your home to reduce risk of falls? Yes   ASSISTIVE DEVICES UTILIZED TO PREVENT FALLS: Life alert? No  Use of a cane, walker or w/c? No  Grab bars in the bathroom? Yes  Shower chair or bench in shower? No  Elevated toilet seat or a handicapped toilet? Yes   TIMED UP AND GO: Was the test performed? No . Virtual visit.   Cognitive Function:  Patient is alert and oriented x3.  Denies difficulty focusing, making decisions, memory loss.    6CIT Screen 10/19/2018  What Year? 0 points  What month? 0 points  What time? 0 points  Count back from 20 0 points  Months in reverse 0 points  Repeat phrase 0 points  Total Score 0    Immunizations Immunization History  Administered Date(s) Administered  . DTaP 03/01/2007  . Fluad Quad(high Dose 65+) 11/09/2018  . Hep A / Hep B 10/30/2014, 12/03/2014, 04/24/2015  . Influenza Split 11/12/2008, 11/11/2009  .  Influenza, High Dose Seasonal PF 10/31/2017,  11/05/2019  . Influenza,inj,Quad PF,6+ Mos 11/03/2012, 11/01/2013, 10/23/2014, 10/28/2015  . Influenza-Unspecified 11/15/2016  . PFIZER SARS-COV-2 Vaccination 03/05/2019, 03/26/2019  . Pneumococcal Conjugate-13 01/01/2014  . Pneumococcal Polysaccharide-23 11/22/2017  . Zoster 10/04/2011  . Zoster Recombinat (Shingrix) 06/09/2016, 11/15/2016   Health Maintenance Health Maintenance  Topic Date Due  . TETANUS/TDAP  Never done  . DEXA SCAN  11/06/2020 (Originally 09/25/2016)  . MAMMOGRAM  01/23/2021  . COLONOSCOPY  10/26/2026  . INFLUENZA VACCINE  Completed  . COVID-19 Vaccine  Completed  . PNA vac Low Risk Adult  Completed  . Hepatitis C Screening  Discontinued   Hepatitis C Screening- discontinued per patient.   Dexa Scan- deferred for later discussion with pcp per patient preference.   Dental Screening: Recommended annual dental exams for proper oral hygiene  Community Resource Referral / Chronic Care Management: CRR required this visit?  No   CCM required this visit?  No      Plan:   Keep all routine maintenance appointments.   Next scheduled lab 11/15/19 @ 9:30  Cpe 11/29/19 @ 11:00  I have personally reviewed and noted the following in the patient's chart:   . Medical and social history . Use of alcohol, tobacco or illicit drugs  . Current medications and supplements . Functional ability and status . Nutritional status . Physical activity . Advanced directives . List of other physicians . Hospitalizations, surgeries, and ER visits in previous 12 months . Vitals . Screenings to include cognitive, depression, and falls . Referrals and appointments  In addition, I have reviewed and discussed with patient certain preventive protocols, quality metrics, and best practice recommendations. A written personalized care plan for preventive services as well as general preventive health recommendations were provided to patient via mychart.     Varney Biles,  LPN   [id number]     Reviewed above information.  Agree with assessment and plan.   Dr Nicki Reaper

## 2019-11-07 NOTE — Patient Instructions (Addendum)
Kara Burns , Thank you for taking time to come for your Medicare Wellness Visit. I appreciate your ongoing commitment to your health goals. Please review the following plan we discussed and let me know if I can assist you in the future.   These are the goals we discussed: Goals    . Follow up with primary care provider as needed       This is a list of the screening recommended for you and due dates:  Health Maintenance  Topic Date Due  . Tetanus Vaccine  Never done  . DEXA scan (bone density measurement)  11/06/2020*  . Mammogram  01/23/2021  . Colon Cancer Screening  10/26/2026  . Flu Shot  Completed  . COVID-19 Vaccine  Completed  . Pneumonia vaccines  Completed  .  Hepatitis C: One time screening is recommended by Center for Disease Control  (CDC) for  adults born from [birth year] through 1964.   Discontinued  *Topic was postponed. The date shown is not the original due date.    Immunizations Immunization History  Administered Date(s) Administered  . DTaP 03/01/2007  . Fluad Quad(high Dose 65+) 11/09/2018  . Hep A / Hep B 10/30/2014, 12/03/2014, 04/24/2015  . Influenza Split 11/12/2008, 11/11/2009  . Influenza, High Dose Seasonal PF 10/31/2017, 11/05/2019  . Influenza,inj,Quad PF,6+ Mos 11/03/2012, 11/01/2013, 10/23/2014, 10/28/2015  . Influenza-Unspecified 11/15/2016  . PFIZER SARS-COV-2 Vaccination 03/05/2019, 03/26/2019  . Pneumococcal Conjugate-13 01/01/2014  . Pneumococcal Polysaccharide-23 11/22/2017  . Zoster 10/04/2011  . Zoster Recombinat (Shingrix) 06/09/2016, 11/15/2016   Keep all routine maintenance appointments.   Next scheduled lab 11/15/19 @ 9:30  Cpe 11/29/19 @ 11:00  Advanced directives: End of life planning; Advance aging; Advanced directives discussed.  Copy of current HCPOA/Living Will requested.    Conditions/risks identified: none new.  Follow up in one year for your annual wellness visit.   Preventive Care 39 Years and Older, Female Preventive  care refers to lifestyle choices and visits with your health care provider that can promote health and wellness. What does preventive care include?  A yearly physical exam. This is also called an annual well check.  Dental exams once or twice a year.  Routine eye exams. Ask your health care provider how often you should have your eyes checked.  Personal lifestyle choices, including:  Daily care of your teeth and gums.  Regular physical activity.  Eating a healthy diet.  Avoiding tobacco and drug use.  Limiting alcohol use.  Practicing safe sex.  Taking low-dose aspirin every day.  Taking vitamin and mineral supplements as recommended by your health care provider. What happens during an annual well check? The services and screenings done by your health care provider during your annual well check will depend on your age, overall health, lifestyle risk factors, and family history of disease. Counseling  Your health care provider may ask you questions about your:  Alcohol use.  Tobacco use.  Drug use.  Emotional well-being.  Home and relationship well-being.  Sexual activity.  Eating habits.  History of falls.  Memory and ability to understand (cognition).  Work and work Statistician.  Reproductive health. Screening  You may have the following tests or measurements:  Height, weight, and BMI.  Blood pressure.  Lipid and cholesterol levels. These may be checked every 5 years, or more frequently if you are over 60 years old.  Skin check.  Lung cancer screening. You may have this screening every year starting at age 69 if you have  a 30-pack-year history of smoking and currently smoke or have quit within the past 15 years.  Fecal occult blood test (FOBT) of the stool. You may have this test every year starting at age 67.  Flexible sigmoidoscopy or colonoscopy. You may have a sigmoidoscopy every 5 years or a colonoscopy every 10 years starting at age  54.  Hepatitis C blood test.  Hepatitis B blood test.  Sexually transmitted disease (STD) testing.  Diabetes screening. This is done by checking your blood sugar (glucose) after you have not eaten for a while (fasting). You may have this done every 1-3 years.  Bone density scan. This is done to screen for osteoporosis. You may have this done starting at age 62.  Mammogram. This may be done every 1-2 years. Talk to your health care provider about how often you should have regular mammograms. Talk with your health care provider about your test results, treatment options, and if necessary, the need for more tests. Vaccines  Your health care provider may recommend certain vaccines, such as:  Influenza vaccine. This is recommended every year.  Tetanus, diphtheria, and acellular pertussis (Tdap, Td) vaccine. You may need a Td booster every 10 years.  Zoster vaccine. You may need this after age 56.  Pneumococcal 13-valent conjugate (PCV13) vaccine. One dose is recommended after age 80.  Pneumococcal polysaccharide (PPSV23) vaccine. One dose is recommended after age 47. Talk to your health care provider about which screenings and vaccines you need and how often you need them. This information is not intended to replace advice given to you by your health care provider. Make sure you discuss any questions you have with your health care provider. Document Released: 02/21/2015 Document Revised: 10/15/2015 Document Reviewed: 11/26/2014 Elsevier Interactive Patient Education  2017 [place] Prevention in the Home Falls can cause injuries. They can happen to people of all ages. There are many things you can do to make your home safe and to help prevent falls. What can I do on the outside of my home?  Regularly fix the edges of walkways and driveways and fix any cracks.  Remove anything that might make you trip as you walk through a door, such as a raised step or threshold.  Trim  any bushes or trees on the path to your home.  Use bright outdoor lighting.  Clear any walking paths of anything that might make someone trip, such as rocks or tools.  Regularly check to see if handrails are loose or broken. Make sure that both sides of any steps have handrails.  Any raised decks and porches should have guardrails on the edges.  Have any leaves, snow, or ice cleared regularly.  Use sand or salt on walking paths during winter.  Clean up any spills in your garage right away. This includes oil or grease spills. What can I do in the bathroom?  Use night lights.  Install grab bars by the toilet and in the tub and shower. Do not use towel bars as grab bars.  Use non-skid mats or decals in the tub or shower.  If you need to sit down in the shower, use a plastic, non-slip stool.  Keep the floor dry. Clean up any water that spills on the floor as soon as it happens.  Remove soap buildup in the tub or shower regularly.  Attach bath mats securely with double-sided non-slip rug tape.  Do not have throw rugs and other things on the floor that can make  you trip. What can I do in the bedroom?  Use night lights.  Make sure that you have a light by your bed that is easy to reach.  Do not use any sheets or blankets that are too big for your bed. They should not hang down onto the floor.  Have a firm chair that has side arms. You can use this for support while you get dressed.  Do not have throw rugs and other things on the floor that can make you trip. What can I do in the kitchen?  Clean up any spills right away.  Avoid walking on wet floors.  Keep items that you use a lot in easy-to-reach places.  If you need to reach something above you, use a strong step stool that has a grab bar.  Keep electrical cords out of the way.  Do not use floor polish or wax that makes floors slippery. If you must use wax, use non-skid floor wax.  Do not have throw rugs and other  things on the floor that can make you trip. What can I do with my stairs?  Do not leave any items on the stairs.  Make sure that there are handrails on both sides of the stairs and use them. Fix handrails that are broken or loose. Make sure that handrails are as long as the stairways.  Check any carpeting to make sure that it is firmly attached to the stairs. Fix any carpet that is loose or worn.  Avoid having throw rugs at the top or bottom of the stairs. If you do have throw rugs, attach them to the floor with carpet tape.  Make sure that you have a light switch at the top of the stairs and the bottom of the stairs. If you do not have them, ask someone to add them for you. What else can I do to help prevent falls?  Wear shoes that:  Do not have high heels.  Have rubber bottoms.  Are comfortable and fit you well.  Are closed at the toe. Do not wear sandals.  If you use a stepladder:  Make sure that it is fully opened. Do not climb a closed stepladder.  Make sure that both sides of the stepladder are locked into place.  Ask someone to hold it for you, if possible.  Clearly mark and make sure that you can see:  Any grab bars or handrails.  First and last steps.  Where the edge of each step is.  Use tools that help you move around (mobility aids) if they are needed. These include:  Canes.  Walkers.  Scooters.  Crutches.  Turn on the lights when you go into a dark area. Replace any light bulbs as soon as they burn out.  Set up your furniture so you have a clear path. Avoid moving your furniture around.  If any of your floors are uneven, fix them.  If there are any pets around you, be aware of where they are.  Review your medicines with your doctor. Some medicines can make you feel dizzy. This can increase your chance of falling. Ask your doctor what other things that you can do to help prevent falls. This information is not intended to replace advice given to  you by your health care provider. Make sure you discuss any questions you have with your health care provider. Document Released: 11/21/2008 Document Revised: 07/03/2015 Document Reviewed: 03/01/2014 Elsevier Interactive Patient Education  2017 [employer].

## 2019-11-15 ENCOUNTER — Other Ambulatory Visit (INDEPENDENT_AMBULATORY_CARE_PROVIDER_SITE_OTHER): Payer: Medicare Other

## 2019-11-15 ENCOUNTER — Other Ambulatory Visit: Payer: Self-pay

## 2019-11-15 DIAGNOSIS — E7800 Pure hypercholesterolemia, unspecified: Secondary | ICD-10-CM

## 2019-11-15 DIAGNOSIS — R739 Hyperglycemia, unspecified: Secondary | ICD-10-CM | POA: Diagnosis not present

## 2019-11-15 LAB — BASIC METABOLIC PANEL
BUN: 10 mg/dL (ref 6–23)
CO2: 29 mEq/L (ref 19–32)
Calcium: 9.9 mg/dL (ref 8.4–10.5)
Chloride: 103 mEq/L (ref 96–112)
Creatinine, Ser: 0.89 mg/dL (ref 0.40–1.20)
GFR: 66.53 mL/min (ref >60.00)
Glucose, Bld: 102 mg/dL — ABNORMAL HIGH (ref 70–99)
Potassium: 4.5 mEq/L (ref 3.5–5.1)
Sodium: 140 mEq/L (ref 135–145)

## 2019-11-15 LAB — HEPATIC FUNCTION PANEL
ALT: 42 U/L — ABNORMAL HIGH (ref 0–35)
AST: 28 U/L (ref 0–37)
Albumin: 4.5 g/dL (ref 3.5–5.2)
Alkaline Phosphatase: 63 U/L (ref 39–117)
Bilirubin, Direct: 0.1 mg/dL (ref 0.0–0.3)
Total Bilirubin: 0.6 mg/dL (ref 0.2–1.2)
Total Protein: 6.7 g/dL (ref 6.0–8.3)

## 2019-11-15 LAB — LIPID PANEL
Cholesterol: 215 mg/dL — ABNORMAL HIGH (ref 0–200)
HDL: 49.6 mg/dL (ref >39.00)
NonHDL: 165.11
Total CHOL/HDL Ratio: 4
Triglycerides: 294 mg/dL — ABNORMAL HIGH (ref 0.0–149.0)
VLDL: 58.8 mg/dL — ABNORMAL HIGH (ref 0.0–40.0)

## 2019-11-15 LAB — LDL CHOLESTEROL, DIRECT: Direct LDL: 125 mg/dL

## 2019-11-15 LAB — HEMOGLOBIN A1C: Hgb A1c MFr Bld: 5.7 % (ref 4.6–6.5)

## 2019-11-15 LAB — TSH: TSH: 3.5 u[IU]/mL (ref 0.35–4.50)

## 2019-11-19 ENCOUNTER — Ambulatory Visit: Payer: Medicare Other | Attending: Internal Medicine

## 2019-11-19 DIAGNOSIS — Z23 Encounter for immunization: Secondary | ICD-10-CM

## 2019-11-19 NOTE — Progress Notes (Signed)
   Covid-19 Vaccination Clinic  Name:  SHAELIN LALLEY    MRN: [id number] DOB: Mar 06, 1950  11/19/2019  Ms. Worsley was observed post Covid-19 immunization for 15 minutes without incident. She was provided with Vaccine Information Sheet and instruction to access the V-Safe system.   Ms. Boberg was instructed to call 911 with any severe reactions post vaccine: Marland Kitchen Difficulty breathing  . Swelling of face and throat  . A fast heartbeat  . A bad rash all over body  . Dizziness and weakness

## 2019-11-20 ENCOUNTER — Encounter: Payer: Medicare Other | Admitting: Internal Medicine

## 2019-11-29 ENCOUNTER — Other Ambulatory Visit: Payer: Self-pay

## 2019-11-29 ENCOUNTER — Ambulatory Visit (INDEPENDENT_AMBULATORY_CARE_PROVIDER_SITE_OTHER): Payer: Medicare Other

## 2019-11-29 ENCOUNTER — Ambulatory Visit (INDEPENDENT_AMBULATORY_CARE_PROVIDER_SITE_OTHER): Payer: Medicare Other | Admitting: Internal Medicine

## 2019-11-29 VITALS — BP 120/72 | HR 75 | Temp 98.4°F | Resp 16 | Ht 61.0 in | Wt 158.4 lb

## 2019-11-29 DIAGNOSIS — M25561 Pain in right knee: Secondary | ICD-10-CM

## 2019-11-29 DIAGNOSIS — N281 Cyst of kidney, acquired: Secondary | ICD-10-CM

## 2019-11-29 DIAGNOSIS — R739 Hyperglycemia, unspecified: Secondary | ICD-10-CM

## 2019-11-29 DIAGNOSIS — Z0001 Encounter for general adult medical examination with abnormal findings: Secondary | ICD-10-CM

## 2019-11-29 DIAGNOSIS — F439 Reaction to severe stress, unspecified: Secondary | ICD-10-CM

## 2019-11-29 DIAGNOSIS — Z0000 Encounter for general adult medical examination without abnormal findings: Secondary | ICD-10-CM | POA: Diagnosis not present

## 2019-11-29 DIAGNOSIS — M25562 Pain in left knee: Secondary | ICD-10-CM | POA: Insufficient documentation

## 2019-11-29 DIAGNOSIS — M25569 Pain in unspecified knee: Secondary | ICD-10-CM | POA: Insufficient documentation

## 2019-11-29 DIAGNOSIS — E7800 Pure hypercholesterolemia, unspecified: Secondary | ICD-10-CM | POA: Diagnosis not present

## 2019-11-29 DIAGNOSIS — R7989 Other specified abnormal findings of blood chemistry: Secondary | ICD-10-CM

## 2019-11-29 DIAGNOSIS — R945 Abnormal results of liver function studies: Secondary | ICD-10-CM

## 2019-11-29 DIAGNOSIS — K219 Gastro-esophageal reflux disease without esophagitis: Secondary | ICD-10-CM

## 2019-11-29 MED ORDER — ESOMEPRAZOLE MAGNESIUM 40 MG PO CPDR: 40.0000 mg | DELAYED_RELEASE_CAPSULE | Freq: Every day | ORAL | 3 refills | Status: DC

## 2019-11-29 MED ORDER — SERTRALINE HCL 50 MG PO TABS: ORAL_TABLET | ORAL | 2 refills | Status: DC

## 2019-11-29 NOTE — Patient Instructions (Addendum)
Take 1/2 zoloft for one week and then increase to 1 tablet per day.    Continue your current dose of wellbutrin for one week and then decrease to two per day   Stop protonix and start nexkum 40mg  per day.

## 2019-11-29 NOTE — Progress Notes (Signed)
[name], female   DOB: 1951-01-26, 69 y.o.   MRN: [id number]   Subjective:    [name], female    DOB: September 11, 1950, 70 y.o.   MRN: [id number]  HPI This visit occurred during the SARS-CoV-2 public health emergency.  Safety protocols were in place, including screening questions prior to the visit, additional usage of staff PPE, and extensive cleaning of exam room while observing appropriate contact time as indicated for disinfecting solutions.  Patient here for her physical exam.  Increased stress.  Feels down.  On wellbutrin.  Does feel needs something more to help level things out.  No suicidal ideations expressed.  Discussed treatment options.  No chest pain or sob reported.  She is having some increased burping/belching.  Feels needs to change to different PPI.  Does not feel protonix works as well.  Saw Dr Allen Norris - 10/2019.  Discussed f/u.  Increased pain - right knee.  Going down stairs and increased walking - aggravates.  Blood pressure doing well.    Past Medical History:  Diagnosis Date  . Abdominal wall hernia    secondary to large left renal cyst removal  . Arthritis    hands  . Atrophic vaginitis   . Car sickness   . Carpal tunnel syndrome   . Colon polyps   . Complication of anesthesia    slow to wake  . Diverticulosis   . Fibrocystic breast disease   . GERD (gastroesophageal reflux disease)   . History of fatty infiltration of liver   . Hyperlipidemia   . Postmenopausal   . Sinusitis    Past Surgical History:  Procedure Laterality Date  . ABDOMINAL HYSTERECTOMY  2005   partial  . APPENDECTOMY    . BLEPHAROPLASTY    . Bone Spur     removal - left index finger  . CATARACT EXTRACTION, BILATERAL  2000, 2005  . COLONOSCOPY WITH PROPOFOL N/A 10/26/2019   Procedure: COLONOSCOPY WITH PROPOFOL;  Surgeon: Lucilla Lame, MD;  Location: [facility];  Service: Endoscopy;  Laterality: N/A;  . ESOPHAGOGASTRODUODENOSCOPY (EGD) WITH PROPOFOL N/A  10/26/2019   Procedure: ESOPHAGOGASTRODUODENOSCOPY (EGD) WITH PROPOFOL and Dilation;  Surgeon: Lucilla Lame, MD;  Location: [facility];  Service: Endoscopy;  Laterality: N/A;  . EXCISION MORTON'S NEUROMA  [id number]   Dr. Albertine Patricia  . HERNIA REPAIR    . lumb hernia    . RENAL CYST EXCISION    . TUBAL LIGATION     Family History  Problem Relation Age of Onset  . Cancer Father        prostate  . Lung cancer Other        parent  . Hypercholesterolemia Other        parent   Social History   Socioeconomic History  . Marital status: Married    Spouse name: Not on file  . Number of children: 2  . Years of education: Not on file  . Highest education level: Not on file  Occupational History  . Not on file  Tobacco Use  . Smoking status: Former Research scientist (life sciences)  . Smokeless tobacco: Never Used  Vaping Use  . Vaping Use: Never assessed  Substance and Sexual Activity  . Alcohol use: Yes    Alcohol/week: 5.0 - 6.0 standard drinks    Types: 5 - 6 Glasses of wine per week  . Drug use: No  . Sexual activity: Not on file  Other Topics Concern  .  Not on file  Social History Narrative  . Not on file   Social Determinants of Health   Financial Resource Strain: Low Risk   . Difficulty of Paying Living Expenses: Not hard at all  Food Insecurity: No Food Insecurity  . Worried About Charity fundraiser in the Last Year: Never true  . Ran Out of Food in the Last Year: Never true  Transportation Needs: No Transportation Needs  . Lack of Transportation (Medical): No  . Lack of Transportation (Non-Medical): No  Physical Activity: Sufficiently Active  . Days of Exercise per Week: 5 days  . Minutes of Exercise per Session: 30 min  Stress: No Stress Concern Present  . Feeling of Stress : Not at all  Social Connections:   . Frequency of Communication with Friends and Family: Not on file  . Frequency of Social Gatherings with Friends and Family: Not on file  . Attends Religious  Services: Not on file  . Active Member of Clubs or Organizations: Not on file  . Attends Archivist Meetings: Not on file  . Marital Status: Not on file    Outpatient Encounter Medications as of 11/29/2019  Medication Sig  . ALPRAZolam (XANAX) 0.25 MG tablet TAKE 1 TABLET BY MOUTH DAILY AS NEEDED  . buPROPion (WELLBUTRIN XL) 150 MG 24 hr tablet TAKE THREE TABLETS BY MOUTH ONCE A DAY  . cholecalciferol (VITAMIN D3) 25 MCG (1000 UNIT) tablet Take 1,000 Units by mouth daily.  Marland Kitchen esomeprazole (NEXIUM) 40 MG capsule Take 1 capsule (40 mg total) by mouth daily.  Marland Kitchen MILK THISTLE PO Take by mouth 2 (two) times daily.  . Multiple Vitamins-Minerals (ZINC PO) Take by mouth. (Patient not taking: Reported on 10/19/2019)  . Omega-3 Fatty Acids (FISH OIL PO) Take by mouth daily.  . Probiotic Product (PROBIOTIC-10) CHEW Chew by mouth daily.  . sertraline (ZOLOFT) 50 MG tablet Take 1/2 tablet per day for one week and then increase to 1 tablet per day  . Zinc Sulfate (ZINC-220 PO) Take by mouth.  . [DISCONTINUED] fluticasone (FLONASE) 50 MCG/ACT nasal spray PLACE 2 SPRAYS INTO BOTH NOSTRILS DAILY  . [DISCONTINUED] pantoprazole (PROTONIX) 40 MG tablet TAKE ONE TABLET BY twice a day  . [DISCONTINUED] polyethylene glycol-electrolytes (NULYTELY) 420 g solution Prepare according to package instructions. Starting at 5:00 PM: Drink one 8 oz glass of mixture every 15 minutes until you finish half of the jug. Five hours prior to procedure, drink 8 oz glass of mixture every 15 minutes until it is all gone. Make sure you do not drink anything 4 hours prior to your procedure.  . [DISCONTINUED] Sodium Sulfate-Mag Sulfate-KCl (SUTAB) [phone] MG TABS Take 376 mg by mouth as directed.   No facility-administered encounter medications on file as of 11/29/2019.    Review of Systems  Constitutional: Negative for appetite change and unexpected weight change.  HENT: Negative for congestion, sinus pressure and sinus  pain.   Eyes: Negative for pain and visual disturbance.  Respiratory: Negative for cough, chest tightness and shortness of breath.   Cardiovascular: Negative for chest pain, palpitations and leg swelling.  Gastrointestinal: Negative for abdominal pain, diarrhea, nausea and vomiting.       Burping/belching as outlined.    Genitourinary: Negative for difficulty urinating and dysuria.  Musculoskeletal: Negative for joint swelling and myalgias.       Right knee pain as outlined.    Skin: Negative for color change and rash.  Neurological: Negative for dizziness, light-headedness and  headaches.  Hematological: Negative for adenopathy. Does not bruise/bleed easily.  Psychiatric/Behavioral: Negative for agitation.       Increased stress as outlined.         Objective:    Physical Exam Vitals reviewed.  Constitutional:      General: She is not in acute distress.    Appearance: Normal appearance. She is well-developed.  HENT:     Head: Normocephalic and atraumatic.     Right Ear: External ear normal.     Left Ear: External ear normal.  Eyes:     General: No scleral icterus.       Right eye: No discharge.        Left eye: No discharge.     Conjunctiva/sclera: Conjunctivae normal.  Neck:     Thyroid: No thyromegaly.  Cardiovascular:     Rate and Rhythm: Normal rate and regular rhythm.  Pulmonary:     Effort: No tachypnea, accessory muscle usage or respiratory distress.     Breath sounds: Normal breath sounds. No decreased breath sounds or wheezing.  Chest:     Breasts:        Right: No inverted nipple, mass, nipple discharge or tenderness (no axillary adenopathy).        Left: No inverted nipple, mass, nipple discharge or tenderness (no axilarry adenopathy).  Abdominal:     General: Bowel sounds are normal.     Palpations: Abdomen is soft.     Comments: Left side fullness - unchanged.  No significant pain to palpation.    Musculoskeletal:        General: No swelling or  tenderness.     Cervical back: Neck supple. No tenderness.  Lymphadenopathy:     Cervical: No cervical adenopathy.  Skin:    Findings: No erythema or rash.  Neurological:     Mental Status: She is alert and oriented to person, place, and time.  Psychiatric:        Mood and Affect: Mood normal.        Behavior: Behavior normal.     BP 120/72   Pulse 75   Temp 98.4 F (36.9 C) (Oral)   Resp 16   Ht 5' 1"  (1.549 m)   Wt 158 lb 6.4 oz (71.8 kg)   LMP 02/28/1990   SpO2 98%   BMI 29.93 kg/m  Wt Readings from Last 3 Encounters:  11/29/19 158 lb 6.4 oz (71.8 kg)  11/07/19 157 lb (71.2 kg)  10/26/19 157 lb 3.2 oz (71.3 kg)     Lab Results  Component Value Date   WBC 7.4 01/26/2019   HGB 14.1 01/26/2019   HCT 41.0 01/26/2019   PLT 218.0 01/26/2019   GLUCOSE 102 (H) 11/15/2019   CHOL 215 (H) 11/15/2019   TRIG 294.0 (H) 11/15/2019   HDL 49.60 11/15/2019   LDLDIRECT 125.0 11/15/2019   LDLCALC 116 (H) 06/21/2013   ALT 42 (H) 11/15/2019   AST 28 11/15/2019   NA 140 11/15/2019   K 4.5 11/15/2019   CL 103 11/15/2019   CREATININE 0.89 11/15/2019   BUN 10 11/15/2019   CO2 29 11/15/2019   TSH 3.50 11/15/2019   HGBA1C 5.7 11/15/2019      Assessment & Plan:   Problem List Items Addressed This Visit    Stress    Increased stress as outlined.  Discussed treatment options.  On wellbutrin.  Discussed treatment options.  Start zoloft 51m q day x 1 week and then increase to 533mq  day.  On wellbutrin 125m (three tablets per day).  After one week, decrease wellbutrin to two per day.  Follow closely.  Get her back in soon to reassess.        Right knee pain    Persistent pain.  Check xray.       Relevant Orders   DG Knee 1-2 Views Right (Completed)   Renal cyst    See previous note.  Has seen urology.  Stable.        Hyperglycemia    Low carb diet and exercise.  Follow met b and a1c.       Hypercholesterolemia    Low cholesterol diet and exercise.  Follow lipid  panel.       Health care maintenance    Physical today 11/29/19.  Mammogram 01/24/19 - Birads I ([facility]).  Colonoscopy 10/26/19 - pathology - tubular adenoma.  Recommended f/u colonoscopy in 7 years.       GERD (gastroesophageal reflux disease)    With increased belching and burping.  Does not feel protonix works as well for her.  Wants to change to a different PPI.  Will change to nexium.  Not sure if will help.  Monitor for triggers.  Adjust diet.  Follow.  May need f/u with GI.  Just evaluated 10/2019.       Relevant Medications   esomeprazole (NEXIUM) 40 MG capsule   Abnormal liver function test    11/15/19 liver function tests are stable.            CEinar Pheasant MD

## 2019-11-29 NOTE — Assessment & Plan Note (Signed)
Physical today 11/29/19.  Mammogram 01/24/19 - Birads I ([facility]).  Colonoscopy 10/26/19 - pathology - tubular adenoma.  Recommended f/u colonoscopy in 7 years.

## 2019-12-01 ENCOUNTER — Encounter: Payer: Self-pay | Admitting: Internal Medicine

## 2019-12-01 NOTE — Assessment & Plan Note (Signed)
Low cholesterol diet and exercise.  Follow lipid panel.   

## 2019-12-01 NOTE — Assessment & Plan Note (Signed)
11/15/19 liver function tests are stable.

## 2019-12-01 NOTE — Assessment & Plan Note (Signed)
Low carb diet and exercise.  Follow met b and a1c.  

## 2019-12-01 NOTE — Assessment & Plan Note (Signed)
See previous note.  Has seen urology.  Stable.

## 2019-12-01 NOTE — Assessment & Plan Note (Signed)
Increased stress as outlined.  Discussed treatment options.  On wellbutrin.  Discussed treatment options.  Start zoloft 25mg  q day x 1 week and then increase to 50mg  q day.  On wellbutrin 150mg  (three tablets per day).  After one week, decrease wellbutrin to two per day.  Follow closely.  Get her back in soon to reassess.

## 2019-12-01 NOTE — Assessment & Plan Note (Signed)
With increased belching and burping.  Does not feel protonix works as well for her.  Wants to change to a different PPI.  Will change to nexium.  Not sure if will help.  Monitor for triggers.  Adjust diet.  Follow.  May need f/u with GI.  Just evaluated 10/2019.

## 2019-12-01 NOTE — Assessment & Plan Note (Signed)
Persistent pain.  Check xray.

## 2019-12-05 ENCOUNTER — Encounter: Payer: Self-pay | Admitting: Internal Medicine

## 2019-12-07 ENCOUNTER — Encounter: Payer: Self-pay | Admitting: Internal Medicine

## 2019-12-19 ENCOUNTER — Encounter: Payer: Self-pay | Admitting: Internal Medicine

## 2019-12-20 ENCOUNTER — Telehealth (INDEPENDENT_AMBULATORY_CARE_PROVIDER_SITE_OTHER): Payer: Medicare Other | Admitting: Internal Medicine

## 2019-12-20 DIAGNOSIS — K219 Gastro-esophageal reflux disease without esophagitis: Secondary | ICD-10-CM | POA: Diagnosis not present

## 2019-12-20 DIAGNOSIS — F439 Reaction to severe stress, unspecified: Secondary | ICD-10-CM

## 2019-12-20 NOTE — Telephone Encounter (Signed)
ok 

## 2019-12-20 NOTE — Telephone Encounter (Signed)
Patient scheduled at 4;30 today.

## 2019-12-20 NOTE — Telephone Encounter (Signed)
Offered her 4:00 tomorrow. She cannot do that time. Are you okay with adding her on at 4:30 today?

## 2019-12-20 NOTE — Progress Notes (Signed)
[name], female   DOB: 02-Aug-1950, 69 y.o.   MRN: [id number]   Virtual Visit via video Note  This visit type was conducted due to national recommendations for restrictions regarding the COVID-19 pandemic (e.g. social distancing).  This format is felt to be most appropriate for this patient at this time.  All issues noted in this document were discussed and addressed.  No physical exam was performed (except for noted visual exam findings with Video Visits).   I connected with Winry Egnew by a video enabled telemedicine application and verified that I am speaking with the correct person using two identifiers. Location patient: home Location provider: work  Persons participating in the virtual visit: patient, provider  The limitations, risks, security and privacy concerns of performing an evaluation and management service by video and the availability of in person appointments have been discussed.  It has also been discussed with the patient that there may be a patient responsible charge related to this service. The patient expressed understanding and agreed to proceed.   Reason for visit: work in appt.   HPI: Work in appt to discuss zoloft and possible side effects of the medication.  Recently we added zoloft to her medication regimen.  She had been on wellbutrin for years.  Was feeling more down.  Felt needed something to help level things out.  zoloft added at 1/2 50mg  dose.  wellbutrin does decreased recently to two per day.  She reports increased diarrhea after starting zoloft - 4-5 loose stools per day.  No fever.  No vomiting.  No other symptoms - just diarrhea.  No one else sick.  No abdominal pain. Diarrhea has improved.  Now only having 1-2 stools per day.  She feels the zoloft is helping.  Does not feel down.  Has more energy.  States "feels like her old self".  Also, feels the nexium is working better for acid reflux (burping/belching).  Some nausea occasionally, but  overall feels nexium is working better. some dry mouth.      ROS: See pertinent positives and negatives per HPI.  Past Medical History:  Diagnosis Date  . Abdominal wall hernia    secondary to large left renal cyst removal  . Arthritis    hands  . Atrophic vaginitis   . Car sickness   . Carpal tunnel syndrome   . Colon polyps   . Complication of anesthesia    slow to wake  . Diverticulosis   . Fibrocystic breast disease   . GERD (gastroesophageal reflux disease)   . History of fatty infiltration of liver   . Hyperlipidemia   . Postmenopausal   . Sinusitis     Past Surgical History:  Procedure Laterality Date  . ABDOMINAL HYSTERECTOMY  2005   partial  . APPENDECTOMY    . BLEPHAROPLASTY    . Bone Spur     removal - left index finger  . CATARACT EXTRACTION, BILATERAL  2000, 2005  . COLONOSCOPY WITH PROPOFOL N/A 10/26/2019   Procedure: COLONOSCOPY WITH PROPOFOL;  Surgeon: Lucilla Lame, MD;  Location: [facility];  Service: Endoscopy;  Laterality: N/A;  . ESOPHAGOGASTRODUODENOSCOPY (EGD) WITH PROPOFOL N/A 10/26/2019   Procedure: ESOPHAGOGASTRODUODENOSCOPY (EGD) WITH PROPOFOL and Dilation;  Surgeon: Lucilla Lame, MD;  Location: [facility];  Service: Endoscopy;  Laterality: N/A;  . EXCISION MORTON'S NEUROMA  [id number]   Dr. Albertine Patricia  . HERNIA REPAIR    . lumb hernia    . RENAL CYST  EXCISION    . TUBAL LIGATION      Family History  Problem Relation Age of Onset  . Cancer Father        prostate  . Lung cancer Other        parent  . Hypercholesterolemia Other        parent    SOCIAL HX: reviewed.    Current Outpatient Medications:  .  ALPRAZolam (XANAX) 0.25 MG tablet, TAKE 1 TABLET BY MOUTH DAILY AS NEEDED, Disp: 30 tablet, Rfl: 1 .  buPROPion (WELLBUTRIN XL) 150 MG 24 hr tablet, TAKE THREE TABLETS BY MOUTH ONCE A DAY (Patient taking differently: Take 300 mg by mouth daily. ), Disp: 90 tablet, Rfl: 1 .  cholecalciferol (VITAMIN D3) 25 MCG  (1000 UNIT) tablet, Take 1,000 Units by mouth daily., Disp: , Rfl:  .  esomeprazole (NEXIUM) 40 MG capsule, Take 1 capsule (40 mg total) by mouth daily., Disp: 30 capsule, Rfl: 3 .  MILK THISTLE PO, Take by mouth 2 (two) times daily., Disp: , Rfl:  .  Multiple Vitamins-Minerals (ZINC PO), Take by mouth. (Patient not taking: Reported on 10/19/2019), Disp: , Rfl:  .  Omega-3 Fatty Acids (FISH OIL PO), Take by mouth daily., Disp: , Rfl:  .  Probiotic Product (PROBIOTIC-10) CHEW, Chew by mouth daily., Disp: , Rfl:  .  sertraline (ZOLOFT) 50 MG tablet, Take 1/2 tablet per day for one week and then increase to 1 tablet per day (Patient taking differently: Take 50 mg by mouth daily. ), Disp: 30 tablet, Rfl: 2 .  Zinc Sulfate (ZINC-220 PO), Take by mouth., Disp: , Rfl:   EXAM:  GENERAL: alert, oriented, appears well and in no acute distress  HEENT: atraumatic, conjunttiva clear, no obvious abnormalities on inspection of external nose and ears  NECK: normal movements of the head and neck  LUNGS: on inspection no signs of respiratory distress, breathing rate appears normal, no obvious gross SOB, gasping or wheezing  CV: no obvious cyanosis  PSYCH/NEURO: pleasant and cooperative, no obvious depression or anxiety, speech and thought processing grossly intact  ASSESSMENT AND PLAN:  Discussed the following assessment and plan:  Problem List Items Addressed This Visit    Stress    Recently started on zoloft.  Dose increased recently to 50mg  q day.  Had diarrhea initially after starting the medication.  Still with 1-2 stools per day.  Some stool is formed.  Discussed probiotics.  Feels zoloft is helping her symptoms.  Does not feel as "down".  Energy better.  Will continue zoloft at current dose for now.  Symptoms may be from being on both medications.  Decrease wellbutrin to one per day.  Call with update over the next 1-2 weeks.  Will continue to taper wellbutrin if doing well.   Follow.        GERD  (gastroesophageal reflux disease)    Doing better on nexium.  Continue.  Some occasional nausea.  Will follow.  Notify me if persistent.  Adjust wellbutrin dose as outlined.           I discussed the assessment and treatment plan with the patient. The patient was provided an opportunity to ask questions and all were answered. The patient agreed with the plan and demonstrated an understanding of the instructions.   The patient was advised to call back or seek an in-person evaluation if the symptoms worsen or if the condition fails to improve as anticipated.    Einar Pheasant, MD

## 2019-12-21 ENCOUNTER — Encounter: Payer: Self-pay | Admitting: Internal Medicine

## 2019-12-21 NOTE — Assessment & Plan Note (Signed)
Recently started on zoloft.  Dose increased recently to 50mg  q day.  Had diarrhea initially after starting the medication.  Still with 1-2 stools per day.  Some stool is formed.  Discussed probiotics.  Feels zoloft is helping her symptoms.  Does not feel as "down".  Energy better.  Will continue zoloft at current dose for now.  Symptoms may be from being on both medications.  Decrease wellbutrin to one per day.  Call with update over the next 1-2 weeks.  Will continue to taper wellbutrin if doing well.   Follow.

## 2019-12-21 NOTE — Assessment & Plan Note (Signed)
Doing better on nexium.  Continue.  Some occasional nausea.  Will follow.  Notify me if persistent.  Adjust wellbutrin dose as outlined.

## 2019-12-27 DIAGNOSIS — Z85820 Personal history of malignant melanoma of skin: Secondary | ICD-10-CM | POA: Diagnosis not present

## 2019-12-27 DIAGNOSIS — Z872 Personal history of diseases of the skin and subcutaneous tissue: Secondary | ICD-10-CM | POA: Diagnosis not present

## 2019-12-27 DIAGNOSIS — Z86018 Personal history of other benign neoplasm: Secondary | ICD-10-CM | POA: Diagnosis not present

## 2019-12-27 DIAGNOSIS — L578 Other skin changes due to chronic exposure to nonionizing radiation: Secondary | ICD-10-CM | POA: Diagnosis not present

## 2019-12-28 ENCOUNTER — Encounter: Payer: Self-pay | Admitting: Internal Medicine

## 2020-01-09 NOTE — Telephone Encounter (Signed)
Pt called wanting to have a VV today.. she is still having ear pain and now having sinus issues  Suggest pt to go to an UC.. she wanted me to send a message back to get worked in

## 2020-01-09 NOTE — Telephone Encounter (Signed)
Pt scheduled for tomorrow at 4:30. Confirmed patient ok to wait until tomorrow. She is having sinus pressure, congestion, cough, ear ache and drainage. No fever, chills, nausea, vomiting, SOB, etc.

## 2020-01-10 ENCOUNTER — Encounter: Payer: Self-pay | Admitting: Internal Medicine

## 2020-01-10 ENCOUNTER — Other Ambulatory Visit: Payer: Self-pay

## 2020-01-10 ENCOUNTER — Telehealth (INDEPENDENT_AMBULATORY_CARE_PROVIDER_SITE_OTHER): Payer: Medicare Other | Admitting: Internal Medicine

## 2020-01-10 DIAGNOSIS — R059 Cough, unspecified: Secondary | ICD-10-CM | POA: Diagnosis not present

## 2020-01-10 MED ORDER — PREDNISONE 10 MG PO TABS: ORAL_TABLET | ORAL | 0 refills | Status: DC

## 2020-01-10 MED ORDER — GUAIFENESIN-DM 100-10 MG/5ML PO SYRP: 5.0000 mL | ORAL_SOLUTION | Freq: Every evening | ORAL | 0 refills | Status: DC | PRN

## 2020-01-10 MED ORDER — GUAIFENESIN ER 600 MG PO TB12: ORAL_TABLET | ORAL | 0 refills | Status: DC

## 2020-01-10 NOTE — Progress Notes (Signed)
[name], female   DOB: 21-Feb-1950, 69 y.o.   MRN: [id number]   Virtual Visit via video Note  This visit type was conducted due to national recommendations for restrictions regarding the COVID-19 pandemic (e.g. social distancing).  This format is felt to be most appropriate for this patient at this time.  All issues noted in this document were discussed and addressed.  No physical exam was performed (except for noted visual exam findings with Video Visits).   I connected with Kara Burns by a video enabled telemedicine application and verified that I am speaking with the correct person using two identifiers. Location patient: home Location provider: work Persons participating in the virtual visit: patient, provider  The limitations, risks, security and privacy concerns of performing an evaluation and management service by video and the availability of in person appointments have been discussed. It has also been discussed with the patient that there may be a patient responsible charge related to this service. The patient expressed understanding and agreed to proceed.   Reason for visit: work in appt  HPI: Kara Burns to family gathering Thanksgiving.  Great niece - had a cough.  Was diagnosed with bronchitis.  Niece - sinus symptoms.  covid negative.  Several days later, she noticed increased sinus pressure.  Ears popping. Notices when swallowing.  Increased nasal congestion.  Blowing out mucus.  Coughing.  Coughing up mucus. Clear mucus.  No sore throat.  No chest tightness.  Some sob when going up stairs, but no significant sob reported.  Does occasionally have coughing fits.  Some wheezing.  Has been using simply saline.  Took nyquil last night.  Denies known covid exposure.     ROS: See pertinent positives and negatives per HPI.  Past Medical History:  Diagnosis Date  . Abdominal wall hernia    secondary to large left renal cyst removal  . Arthritis    hands  . Atrophic  vaginitis   . Car sickness   . Carpal tunnel syndrome   . Colon polyps   . Complication of anesthesia    slow to wake  . Diverticulosis   . Fibrocystic breast disease   . GERD (gastroesophageal reflux disease)   . History of fatty infiltration of liver   . Hyperlipidemia   . Postmenopausal   . Sinusitis     Past Surgical History:  Procedure Laterality Date  . ABDOMINAL HYSTERECTOMY  2005   partial  . APPENDECTOMY    . BLEPHAROPLASTY    . Bone Spur     removal - left index finger  . CATARACT EXTRACTION, BILATERAL  2000, 2005  . COLONOSCOPY WITH PROPOFOL N/A 10/26/2019   Procedure: COLONOSCOPY WITH PROPOFOL;  Surgeon: Lucilla Lame, MD;  Location: [facility];  Service: Endoscopy;  Laterality: N/A;  . ESOPHAGOGASTRODUODENOSCOPY (EGD) WITH PROPOFOL N/A 10/26/2019   Procedure: ESOPHAGOGASTRODUODENOSCOPY (EGD) WITH PROPOFOL and Dilation;  Surgeon: Lucilla Lame, MD;  Location: [facility];  Service: Endoscopy;  Laterality: N/A;  . EXCISION M[facility]ON'S NEUROMA  [id number]   Dr. Albertine Patricia  . HERNIA REPAIR    . lumb hernia    . RENAL CYST EXCISION    . TUBAL LIGATION      Family History  Problem Relation Age of Onset  . Cancer Father        prostate  . Lung cancer Other        parent  . Hypercholesterolemia Other        parent  SOCIAL HX: reviewed.    Current Outpatient Medications:  .  ALPRAZolam (XANAX) 0.25 MG tablet, TAKE 1 TABLET BY MOUTH DAILY AS NEEDED, Disp: 30 tablet, Rfl: 1 .  buPROPion (WELLBUTRIN XL) 150 MG 24 hr tablet, TAKE THREE TABLETS BY MOUTH ONCE A DAY (Patient taking differently: Take 150 mg by mouth daily. ), Disp: 90 tablet, Rfl: 1 .  esomeprazole (NEXIUM) 40 MG capsule, Take 1 capsule (40 mg total) by mouth daily., Disp: 30 capsule, Rfl: 3 .  MILK THISTLE PO, Take by mouth 2 (two) times daily., Disp: , Rfl:  .  Omega-3 Fatty Acids (FISH OIL PO), Take by mouth daily., Disp: , Rfl:  .  Probiotic Product (PROBIOTIC-10) CHEW, Chew by  mouth daily., Disp: , Rfl:  .  sertraline (ZOLOFT) 50 MG tablet, Take 1/2 tablet per day for one week and then increase to 1 tablet per day (Patient taking differently: Take 50 mg by mouth daily. ), Disp: 30 tablet, Rfl: 2 .  Zinc Sulfate (ZINC-220 PO), Take by mouth., Disp: , Rfl:  .  cholecalciferol (VITAMIN D3) 25 MCG (1000 UNIT) tablet, Take 1,000 Units by mouth daily. (Patient not taking: Reported on 01/10/2020), Disp: , Rfl:  .  guaiFENesin (MUCINEX) 600 MG 12 hr tablet, Take on tablet q am prn, Disp: 20 tablet, Rfl: 0 .  guaiFENesin-dextromethorphan (ROBITUSSIN DM) 100-10 MG/5ML syrup, Take 5 mLs by mouth at bedtime as needed for cough., Disp: 118 mL, Rfl: 0 .  predniSONE (DELTASONE) 10 MG tablet, Take 6 tablets x 1 day and then decrease by 1/2 tablet per day until down zero mg, Disp: 39 tablet, Rfl: 0  EXAM:  GENERAL: alert, oriented, appears well and in no acute distress  HEENT: atraumatic, conjunttiva clear, no obvious abnormalities on inspection of external nose and ears  NECK: normal movements of the head and neck  LUNGS: on inspection no signs of respiratory distress, breathing rate appears normal, no obvious gross SOB, gasping or wheezing  CV: no obvious cyanosis  PSYCH/NEURO: pleasant and cooperative, no obvious depression or anxiety, speech and thought processing grossly intact  ASSESSMENT AND PLAN:  Discussed the following assessment and plan:  Problem List Items Addressed This Visit    Cough    Increased sinus pressure, nasal congestion, drainage and cough - productive.  Discussed covid testing.  Discussed quarantine.  Saline nasal spray and flonase nasal spray as directed.  mucinex in the am and robitussin in the evening.  Prednisone taper as directed.  Follow. Hold on abx.  Call with update.           I discussed the assessment and treatment plan with the patient. The patient was provided an opportunity to ask questions and all were answered. The patient agreed  with the plan and demonstrated an understanding of the instructions.   The patient was advised to call back or seek an in-person evaluation if the symptoms worsen or if the condition fails to improve as anticipated.    Einar Pheasant, MD

## 2020-01-11 ENCOUNTER — Other Ambulatory Visit: Payer: Medicare Other

## 2020-01-11 ENCOUNTER — Encounter: Payer: Self-pay | Admitting: Internal Medicine

## 2020-01-11 DIAGNOSIS — R059 Cough, unspecified: Secondary | ICD-10-CM | POA: Diagnosis not present

## 2020-01-11 NOTE — Addendum Note (Signed)
Addended by: Lars Masson on: 01/11/2020 10:37 AM   Modules accepted: Orders

## 2020-01-11 NOTE — Assessment & Plan Note (Signed)
Increased sinus pressure, nasal congestion, drainage and cough - productive.  Discussed covid testing.  Discussed quarantine.  Saline nasal spray and flonase nasal spray as directed.  mucinex in the am and robitussin in the evening.  Prednisone taper as directed.  Follow. Hold on abx.  Call with update.

## 2020-01-12 ENCOUNTER — Encounter: Payer: Self-pay | Admitting: Internal Medicine

## 2020-01-13 LAB — SARS-COV-2, NAA 2 DAY TAT

## 2020-01-13 LAB — NOVEL CORONAVIRUS, NAA: SARS-CoV-2, NAA: NOT DETECTED

## 2020-01-14 MED ORDER — CEFDINIR 300 MG PO CAPS: 300.0000 mg | ORAL_CAPSULE | Freq: Two times a day (BID) | ORAL | 0 refills | Status: DC

## 2020-01-14 NOTE — Telephone Encounter (Signed)
Confirm no sob. Confirm her only allergy is erythromycin.  If so, then ok to send in rx for omnicef 300mg  bid  X 10 days.  Will need to take a probiotic daily while on abx and for two weeks after completing abx.  If persistent symptoms, let us know - may need xray, etc.

## 2020-01-14 NOTE — Telephone Encounter (Signed)
Please see previous messages.     [facility] Night - Cl TELEPHONE ADVICE RECORD [facility] Patient Name: Floyce Printz Gender: Female DOB: [birth year] Age: 69 Y 13 M 17 D Return Phone Number: [phone] (Primary) Address: City/State/Zip: [place] [address] Client [facility] Night - Cl Client Site [place] Physician Einar Pheasant - MD Contact Type Call Who Is Calling Patient / Member / Family / Caregiver Call Type Triage / Clinical Relationship To Patient Self Return Phone Number [phone] (Primary) Chief Complaint Cough Reason for Call Symptomatic / Request for Health Information Initial Comment caller states she had a virtual visit Thursday. She is still having sick symptoms and needs an antibiotic. Her worsening symptoms include hacking worsening cough, nasal/ head congestion,and chest congestion. She has been taking prednisone, mucus er relief, robitussin, and flonase. She has been sick for a weak and is sore from coughing. Her covid test came back negative. Translation No Nurse Assessment Nurse: Laveda Abbe, RN, Marjory Lies Date/Time Eilene Ghazi Time): 01/13/2020 12:58:20 PM Confirm and document reason for call. If symptomatic, describe symptoms. ---Caller states she had a virtual visit Thursday. She is still having sick symptoms and needs an antibiotic. Her worsening symptoms include hacking worsening cough, nasal/ head congestion,and chest congestion. She has been taking prednisone, mucus er relief, robitussin, and flonase. She has been sick for a weak and is sore from coughing. Her covid test came back negative. Pt states that she not getting any better. Not running fever. NO resp distress. Does the patient have any new or worsening symptoms? ---Yes Will a triage be completed? ---Yes Related visit to physician within the last 2 weeks? ---Yes Does the PT have any chronic  conditions? (i.e. diabetes, asthma, this includes High risk factors for pregnancy, etc.) ---No Is this a behavioral health or substance abuse call? ---No Guidelines Guideline Title Affirmed Question Affirmed Notes Nurse Date/Time (Eastern Time) Cough - Acute NonProductive Cough with cold symptoms (e.g., runny nose, postnasal drip, throat clearing) Hassan Buckler 01/13/2020 1:02:17 PM PLEASE NOTE: All timestamps contained within this report are represented as [place] Standard Time. CONFIDENTIALTY NOTICE: This fax transmission is intended only for the addressee. It contains information that is legally privileged, confidential or otherwise protected from use or disclosure. If you are not the intended recipient, you are strictly prohibited from reviewing, disclosing, copying using or disseminating any of this information or taking any action in reliance on or regarding this information. If you have received this fax in error, please notify us immediately by telephone so that we can arrange for its return to [place]. Phone: [phone], Toll-Free: [phone], Fax: [phone] Page: 2 of 2 Call Id: [id number] [place]. Time Eilene Ghazi Time) Disposition Final User 01/13/2020 1:09:56 [place], RN, Carlyon Shadow Disagree/Comply Comply Caller Understands No PreDisposition Home Care Care Advice Given Per Guideline HOME CARE: REASSURANCE AND EDUCATION - COMMON COLD SYMPTOMS : CALL BACK IF: * Nasal discharge lasts over 10 days * Fever over 104 F (40 C), or fever lasts over 3 days * You become worse NASAL WASHES - STEP-BY-STEP INSTRUCTIONS:

## 2020-01-14 NOTE — Telephone Encounter (Signed)
Pt called and states that she is not feeling better and she states that she needs some medication. She states that her covid test was negative. No appts avail at time of call. Please advise ASAP

## 2020-01-20 ENCOUNTER — Encounter: Payer: Self-pay | Admitting: Internal Medicine

## 2020-01-22 ENCOUNTER — Encounter: Payer: Self-pay | Admitting: Internal Medicine

## 2020-01-23 ENCOUNTER — Telehealth (INDEPENDENT_AMBULATORY_CARE_PROVIDER_SITE_OTHER): Payer: Medicare Other | Admitting: Internal Medicine

## 2020-01-23 DIAGNOSIS — R059 Cough, unspecified: Secondary | ICD-10-CM

## 2020-01-23 MED ORDER — ALBUTEROL SULFATE HFA 108 (90 BASE) MCG/ACT IN AERS: 2.0000 | INHALATION_SPRAY | Freq: Four times a day (QID) | RESPIRATORY_TRACT | 0 refills | Status: DC | PRN

## 2020-01-23 MED ORDER — BENZONATATE 100 MG PO CAPS: 100.0000 mg | ORAL_CAPSULE | Freq: Three times a day (TID) | ORAL | 0 refills | Status: DC | PRN

## 2020-01-23 MED ORDER — CEFDINIR 300 MG PO CAPS
300.0000 mg | ORAL_CAPSULE | Freq: Two times a day (BID) | ORAL | 0 refills | Status: DC
Start: 2020-01-23 — End: 2020-07-28

## 2020-01-23 NOTE — Progress Notes (Signed)
[name], female   DOB: [birth year], 69 y.o.   MRN: [id number]   Virtual Visit via video Note  This visit type was conducted due to national recommendations for restrictions regarding the COVID-19 pandemic (e.g. social distancing).  This format is felt to be most appropriate for this patient at this time.  All issues noted in this document were discussed and addressed.  No physical exam was performed (except for noted visual exam findings with Video Visits).   I connected with Kara Burns by a video enabled telemedicine application or telephone and verified that I am speaking with the correct person using two identifiers. Location patient: home Location provider: work  Persons participating in the virtual visit: patient, provider  The limitations, risks, security and privacy concerns of performing an evaluation and management service by video and the availability of in person appointments have been discussed. It has also been discussed with the patient that there may be a patient responsible charge related to this service. The patient expressed understanding and agreed to proceed.   Reason for visit: work in appt  HPI: Was seen 01/10/20 with sinus pressure and congestion/cough.  Treated with omnicef.  Using saline nasal spray and taking mucinex in the am.  She is better, but still reports increased sinus pressure - headache (intermittent).  Increased head congestion, nasal congestion and drainage.  No sore throat.  Hoarseness.  Still with increased cough and some coughing fits - but better.  No chest pain or chest tightness.  No nausea or vomiting.  Diarrhea resolved.  Took last abx today.  Completed prednisone two days ago.     ROS: See pertinent positives and negatives per HPI.  Past Medical History:  Diagnosis Date  . Abdominal wall hernia    secondary to large left renal cyst removal  . Arthritis    hands  . Atrophic vaginitis   . Car sickness   . Carpal tunnel  syndrome   . Colon polyps   . Complication of anesthesia    slow to wake  . Diverticulosis   . Fibrocystic breast disease   . GERD (gastroesophageal reflux disease)   . History of fatty infiltration of liver   . Hyperlipidemia   . Postmenopausal   . Sinusitis     Past Surgical History:  Procedure Laterality Date  . ABDOMINAL HYSTERECTOMY  2005   partial  . APPENDECTOMY    . BLEPHAROPLASTY    . Bone Spur     removal - left index finger  . CATARACT EXTRACTION, BILATERAL  2000, 2005  . COLONOSCOPY WITH PROPOFOL N/A 10/26/2019   Procedure: COLONOSCOPY WITH PROPOFOL;  Surgeon: Lucilla Lame, MD;  Location: [facility];  Service: Endoscopy;  Laterality: N/A;  . ESOPHAGOGASTRODUODENOSCOPY (EGD) WITH PROPOFOL N/A 10/26/2019   Procedure: ESOPHAGOGASTRODUODENOSCOPY (EGD) WITH PROPOFOL and Dilation;  Surgeon: Lucilla Lame, MD;  Location: [facility];  Service: Endoscopy;  Laterality: N/A;  . EXCISION [facility]ON'S NEUROMA  [id number]   Dr. Albertine Patricia  . HERNIA REPAIR    . lumb hernia    . RENAL CYST EXCISION    . TUBAL LIGATION      Family History  Problem Relation Age of Onset  . Cancer Father        prostate  . Lung cancer Other        parent  . Hypercholesterolemia Other        parent    SOCIAL HX: reviewed.    Current Outpatient Medications:  .  albuterol (VENTOLIN HFA) 108 (90 Base) MCG/ACT inhaler, Inhale 2 puffs into the lungs every 6 (six) hours as needed for wheezing or shortness of breath., Disp: 18 g, Rfl: 0 .  ALPRAZolam (XANAX) 0.25 MG tablet, TAKE 1 TABLET BY MOUTH DAILY AS NEEDED, Disp: 30 tablet, Rfl: 1 .  benzonatate (TESSALON PERLES) 100 MG capsule, Take 1 capsule (100 mg total) by mouth 3 (three) times daily as needed for cough., Disp: 21 capsule, Rfl: 0 .  buPROPion (WELLBUTRIN XL) 150 MG 24 hr tablet, TAKE THREE TABLETS BY MOUTH ONCE A DAY (Patient taking differently: Take 150 mg by mouth daily. ), Disp: 90 tablet, Rfl: 1 .  cefdinir (OMNICEF)  300 MG capsule, Take 1 capsule (300 mg total) by mouth 2 (two) times daily., Disp: 14 capsule, Rfl: 0 .  cholecalciferol (VITAMIN D3) 25 MCG (1000 UNIT) tablet, Take 1,000 Units by mouth daily. (Patient not taking: Reported on 01/10/2020), Disp: , Rfl:  .  esomeprazole (NEXIUM) 40 MG capsule, Take 1 capsule (40 mg total) by mouth daily., Disp: 30 capsule, Rfl: 3 .  guaiFENesin (MUCINEX) 600 MG 12 hr tablet, Take on tablet q am prn, Disp: 20 tablet, Rfl: 0 .  MILK THISTLE PO, Take by mouth 2 (two) times daily., Disp: , Rfl:  .  Omega-3 Fatty Acids (FISH OIL PO), Take by mouth daily., Disp: , Rfl:  .  Probiotic Product (PROBIOTIC-10) CHEW, Chew by mouth daily., Disp: , Rfl:  .  sertraline (ZOLOFT) 50 MG tablet, Take 1/2 tablet per day for one week and then increase to 1 tablet per day (Patient taking differently: Take 50 mg by mouth daily. ), Disp: 30 tablet, Rfl: 2  EXAM:  GENERAL: alert, oriented, appears well and in no acute distress  HEENT: atraumatic, conjunttiva clear, no obvious abnormalities on inspection of external nose and ears  NECK: normal movements of the head and neck  LUNGS: on inspection no signs of respiratory distress, breathing rate appears normal, no obvious gross SOB, gasping or wheezing  CV: no obvious cyanosis  PSYCH/NEURO: pleasant and cooperative, no obvious depression or anxiety, speech and thought processing grossly intact  ASSESSMENT AND PLAN:  Discussed the following assessment and plan:  Problem List Items Addressed This Visit    Cough    Recently evaluated for increased sinus pressure, nasal congestion, drainage and cough - productive.  Treated with omnicef.  Better, but symptoms have not completely resolved.  Continue saline nasal spray and steroid nasal spray.  Continue mucinex.  Off prednisone.  Extend out omnicef.  Follow.  Notify me if persistent symptoms.            I discussed the assessment and treatment plan with the patient. The patient was  provided an opportunity to ask questions and all were answered. The patient agreed with the plan and demonstrated an understanding of the instructions.   The patient was advised to call back or seek an in-person evaluation if the symptoms worsen or if the condition fails to improve as anticipated.    Einar Pheasant, MD

## 2020-01-24 ENCOUNTER — Ambulatory Visit: Payer: Medicare Other | Admitting: Internal Medicine

## 2020-02-03 ENCOUNTER — Encounter: Payer: Self-pay | Admitting: Internal Medicine

## 2020-02-03 NOTE — Assessment & Plan Note (Signed)
Recently evaluated for increased sinus pressure, nasal congestion, drainage and cough - productive.  Treated with omnicef.  Better, but symptoms have not completely resolved.  Continue saline nasal spray and steroid nasal spray.  Continue mucinex.  Off prednisone.  Extend out omnicef.  Follow.  Notify me if persistent symptoms.

## 2020-02-05 ENCOUNTER — Other Ambulatory Visit: Payer: Self-pay | Admitting: Internal Medicine

## 2020-02-05 MED ORDER — FLUCONAZOLE 150 MG PO TABS: 150.0000 mg | ORAL_TABLET | Freq: Every day | ORAL | 0 refills | Status: DC

## 2020-02-17 ENCOUNTER — Encounter: Payer: Self-pay | Admitting: Internal Medicine

## 2020-02-18 ENCOUNTER — Other Ambulatory Visit: Payer: Self-pay | Admitting: Internal Medicine

## 2020-02-18 ENCOUNTER — Other Ambulatory Visit: Payer: Self-pay

## 2020-02-18 MED ORDER — SERTRALINE HCL 50 MG PO TABS
50.0000 mg | ORAL_TABLET | Freq: Every day | ORAL | 2 refills | Status: DC
Start: 2020-02-18 — End: 2020-04-16

## 2020-02-18 NOTE — Telephone Encounter (Signed)
rx sent in for alprazolam #30 with no refills.   

## 2020-02-19 DIAGNOSIS — Z1231 Encounter for screening mammogram for malignant neoplasm of breast: Secondary | ICD-10-CM | POA: Diagnosis not present

## 2020-02-19 LAB — HM MAMMOGRAPHY

## 2020-02-21 ENCOUNTER — Other Ambulatory Visit: Payer: Self-pay | Admitting: Internal Medicine

## 2020-02-25 LAB — HM MAMMOGRAPHY

## 2020-03-13 DIAGNOSIS — L821 Other seborrheic keratosis: Secondary | ICD-10-CM | POA: Diagnosis not present

## 2020-04-07 ENCOUNTER — Encounter: Payer: Self-pay | Admitting: Internal Medicine

## 2020-04-07 NOTE — Telephone Encounter (Signed)
Please cancel her 04/10/20 appt and schedule f/u appt in 06/2020.  Thanks

## 2020-04-08 NOTE — Telephone Encounter (Signed)
Rescheduled pt

## 2020-04-10 ENCOUNTER — Ambulatory Visit: Payer: Medicare Other | Admitting: Internal Medicine

## 2020-04-16 ENCOUNTER — Other Ambulatory Visit: Payer: Self-pay | Admitting: Internal Medicine

## 2020-04-21 ENCOUNTER — Encounter: Payer: Self-pay | Admitting: Internal Medicine

## 2020-04-22 DIAGNOSIS — M1712 Unilateral primary osteoarthritis, left knee: Secondary | ICD-10-CM | POA: Diagnosis not present

## 2020-04-22 NOTE — Telephone Encounter (Signed)
Patient went to [facility]

## 2020-04-22 NOTE — Telephone Encounter (Signed)
Pt called to follow up on mychart and see if she could be seen today. I told her there were no appts available  I suggested that pt go to [facility]

## 2020-06-13 ENCOUNTER — Other Ambulatory Visit: Payer: Self-pay | Admitting: Internal Medicine

## 2020-06-26 ENCOUNTER — Ambulatory Visit: Payer: Medicare Other | Admitting: Internal Medicine

## 2020-06-30 ENCOUNTER — Encounter: Payer: Self-pay | Admitting: Internal Medicine

## 2020-07-01 NOTE — Telephone Encounter (Signed)
Her renal function is normal.  The optavia diet should be ok for her.  She can schedule an appt to discuss if desires.

## 2020-07-02 NOTE — Telephone Encounter (Signed)
Patient is aware of below. Does not need appt at this time but will reach out to set up a visit if changes her mind.

## 2020-07-17 ENCOUNTER — Ambulatory Visit: Payer: Medicare Other | Admitting: Internal Medicine

## 2020-07-25 ENCOUNTER — Other Ambulatory Visit: Payer: Self-pay | Admitting: Internal Medicine

## 2020-07-28 ENCOUNTER — Telehealth (INDEPENDENT_AMBULATORY_CARE_PROVIDER_SITE_OTHER): Payer: Medicare Other | Admitting: Internal Medicine

## 2020-07-28 VITALS — Ht 61.0 in | Wt 151.5 lb

## 2020-07-28 DIAGNOSIS — R945 Abnormal results of liver function studies: Secondary | ICD-10-CM

## 2020-07-28 DIAGNOSIS — E7800 Pure hypercholesterolemia, unspecified: Secondary | ICD-10-CM

## 2020-07-28 DIAGNOSIS — K219 Gastro-esophageal reflux disease without esophagitis: Secondary | ICD-10-CM

## 2020-07-28 DIAGNOSIS — N281 Cyst of kidney, acquired: Secondary | ICD-10-CM

## 2020-07-28 DIAGNOSIS — R7989 Other specified abnormal findings of blood chemistry: Secondary | ICD-10-CM

## 2020-07-28 DIAGNOSIS — R739 Hyperglycemia, unspecified: Secondary | ICD-10-CM

## 2020-07-28 DIAGNOSIS — Z789 Other specified health status: Secondary | ICD-10-CM

## 2020-07-28 DIAGNOSIS — Z7289 Other problems related to lifestyle: Secondary | ICD-10-CM | POA: Diagnosis not present

## 2020-07-28 DIAGNOSIS — F439 Reaction to severe stress, unspecified: Secondary | ICD-10-CM

## 2020-07-28 NOTE — Progress Notes (Signed)
[name], female   DOB: 1950-04-17, 70 y.o.   MRN: [id number]   Virtual Visit via video Note  This visit type was conducted due to national recommendations for restrictions regarding the COVID-19 pandemic (e.g. social distancing).  This format is felt to be most appropriate for this patient at this time.  All issues noted in this document were discussed and addressed.  No physical exam was performed (except for noted visual exam findings with Video Visits).   I connected with Kara Burns by a video enabled telemedicine application and verified that I am speaking with the correct person using two identifiers. [place] Location provider: work Persons participating in the virtual visit: patient, provider  The limitations, risks, security and privacy concerns of performing an evaluation and management service by video  and the availability of in person appointments have been discussed.  It has also been discussed with the patient that there may be a patient responsible charge related to this service. The patient expressed understanding and agreed to proceed.   Reason for visit: scheduled follow up.    HPI: Follow up regarding increased stress.  Also has a history of renal cysts.  Previously saw urology.  Had questions about f/u.  Discussed f/u with local urologist.  She is agreeable.  She has started a new diet - optavia.  This is her third week.  Has lost 7 pounds.  No chest pain or sob reported.  No abdominal pain.  Bowels doing better with diet change.  Increased stress with her grandson's issues.  Discussed.  Has good family support.  Does not feel needs any further intervention at this time.  On zoloft and feels doing well on this medication.    ROS: See pertinent positives and negatives per HPI.  Past Medical History:  Diagnosis Date   Abdominal wall hernia    secondary to large left renal cyst removal   Arthritis    hands   Atrophic vaginitis    Car  sickness    Carpal tunnel syndrome    Colon polyps    Complication of anesthesia    slow to wake   Diverticulosis    Fibrocystic breast disease    GERD (gastroesophageal reflux disease)    History of fatty infiltration of liver    Hyperlipidemia    Postmenopausal    Sinusitis     Past Surgical History:  Procedure Laterality Date   ABDOMINAL HYSTERECTOMY  2005   partial   APPENDECTOMY     BLEPHAROPLASTY     Bone Spur     removal - left index finger   CATARACT EXTRACTION, BILATERAL  2000, 2005   COLONOSCOPY WITH PROPOFOL N/A 10/26/2019   Procedure: COLONOSCOPY WITH PROPOFOL;  Surgeon: Lucilla Lame, MD;  Location: [facility];  Service: Endoscopy;  Laterality: N/A;   ESOPHAGOGASTRODUODENOSCOPY (EGD) WITH PROPOFOL N/A 10/26/2019   Procedure: ESOPHAGOGASTRODUODENOSCOPY (EGD) WITH PROPOFOL and Dilation;  Surgeon: Lucilla Lame, MD;  Location: [facility];  Service: Endoscopy;  Laterality: N/A;   EXCISION [facility]'S NEUROMA  [id number]   Dr. Albertine Patricia   HERNIA REPAIR     lumb hernia     RENAL CYST EXCISION     TUBAL LIGATION      Family History  Problem Relation Age of Onset   Cancer Father        prostate   Lung cancer Other        parent   Hypercholesterolemia Other  parent    SOCIAL HX: reviewed.    Current Outpatient Medications:    albuterol (VENTOLIN HFA) 108 (90 Base) MCG/ACT inhaler, Inhale 2 puffs into the lungs every 6 (six) hours as needed for wheezing or shortness of breath., Disp: 18 g, Rfl: 0   ALPRAZolam (XANAX) 0.25 MG tablet, TAKE 1 TABLET BY MOUTH DAILY AS NEEDED, Disp: 30 tablet, Rfl: 0   buPROPion (WELLBUTRIN XL) 150 MG 24 hr tablet, TAKE THREE TABLETS BY MOUTH ONCE A DAY (Patient taking differently: Take 150 mg by mouth daily. ), Disp: 90 tablet, Rfl: 1   esomeprazole (NEXIUM) 40 MG capsule, TAKE 1 CAPSULE (40 MG) BY MOUTH EVERY DAY, Disp: 30 capsule, Rfl: 3   MILK THISTLE PO, Take by mouth 2 (two) times daily., Disp: , Rfl:     Omega-3 Fatty Acids (FISH OIL PO), Take by mouth daily., Disp: , Rfl:    Probiotic Product (PROBIOTIC-10) CHEW, Chew by mouth daily., Disp: , Rfl:    sertraline (ZOLOFT) 50 MG tablet, TAKE ONE TABLET BY MOUTH EVERY DAY, Disp: 30 tablet, Rfl: 2  EXAM:  VGENERAL: alert, oriented, appears well and in no acute distress  HEENT: atraumatic, conjunttiva clear, no obvious abnormalities on inspection of external nose and ears  NECK: normal movements of the head and neck  LUNGS: on inspection no signs of respiratory distress, breathing rate appears normal, no obvious gross SOB, gasping or wheezing  CV: no obvious cyanosis  PSYCH/NEURO: pleasant and cooperative, no obvious depression or anxiety, speech and thought processing grossly intact  ASSESSMENT AND PLAN:  Discussed the following assessment and plan:  Problem List Items Addressed This Visit     Abnormal liver function test    Diet and exercise. Follow liver function tests.  She has decreased alcohol intake.         Alcohol use ([place])    Has decreased alcohol intake.  Follow.        GERD (gastroesophageal reflux disease)    No upper symptoms reported.  On nexium.        Hypercholesterolemia    Low cholesterol diet and exercise.  Follow lipid panel.        Relevant Orders   Basic metabolic panel   CBC with Differential/Platelet   Hepatic function panel   Lipid panel   Hyperglycemia - Primary    Low carb diet and exercise.  Follow met b and a1c.        Relevant Orders   Hemoglobin A1c   Renal cyst    Has seen urology previously.  She had questions about f/u regarding her renal cyst.  Request f/u with local urologist.        Relevant Orders   Ambulatory referral to Urology   Stress    Discussed increased stress as outlined.  Continues on zoloft.  Doing well.  Follow.          Return in about 4 months (around 11/27/2020) for physical after 11/28/20.   I discussed the assessment and treatment plan with the  patient. The patient was provided an opportunity to ask questions and all were answered. The patient agreed with the plan and demonstrated an understanding of the instructions.   The patient was advised to call back or seek an in-person evaluation if the symptoms worsen or if the condition fails to improve as anticipated.   Einar Pheasant, MD

## 2020-08-03 ENCOUNTER — Encounter: Payer: Self-pay | Admitting: Internal Medicine

## 2020-08-03 NOTE — Assessment & Plan Note (Signed)
Has seen urology previously.  She had questions about f/u regarding her renal cyst.  Request f/u with local urologist.

## 2020-08-03 NOTE — Assessment & Plan Note (Signed)
Low carb diet and exercise.  Follow met b and a1c.  

## 2020-08-03 NOTE — Assessment & Plan Note (Signed)
Low cholesterol diet and exercise.  Follow lipid panel.   

## 2020-08-03 NOTE — Assessment & Plan Note (Signed)
Diet and exercise. Follow liver function tests.  She has decreased alcohol intake.   

## 2020-08-03 NOTE — Assessment & Plan Note (Signed)
No upper symptoms reported.  On nexium.   

## 2020-08-03 NOTE — Assessment & Plan Note (Signed)
Discussed increased stress as outlined.  Continues on zoloft.  Doing well.  Follow.

## 2020-08-03 NOTE — Assessment & Plan Note (Signed)
Has decreased alcohol intake.  Follow.  

## 2020-08-05 ENCOUNTER — Other Ambulatory Visit (INDEPENDENT_AMBULATORY_CARE_PROVIDER_SITE_OTHER): Payer: Medicare Other

## 2020-08-05 ENCOUNTER — Other Ambulatory Visit: Payer: Self-pay

## 2020-08-05 DIAGNOSIS — R739 Hyperglycemia, unspecified: Secondary | ICD-10-CM

## 2020-08-05 DIAGNOSIS — E7800 Pure hypercholesterolemia, unspecified: Secondary | ICD-10-CM

## 2020-08-05 LAB — CBC WITH DIFFERENTIAL/PLATELET
Basophils Absolute: 0.1 10*3/uL (ref 0.0–0.1)
Basophils Relative: 0.8 % (ref 0.0–3.0)
Eosinophils Absolute: 0.3 10*3/uL (ref 0.0–0.7)
Eosinophils Relative: 4.7 % (ref 0.0–5.0)
HCT: 41.9 % (ref 36.0–46.0)
Hemoglobin: 14.4 g/dL (ref 12.0–15.0)
Lymphocytes Relative: 21.8 % (ref 12.0–46.0)
Lymphs Abs: 1.6 10*3/uL (ref 0.7–4.0)
MCHC: 34.3 g/dL (ref 30.0–36.0)
MCV: 95.2 fl (ref 78.0–100.0)
Monocytes Absolute: 0.6 10*3/uL (ref 0.1–1.0)
Monocytes Relative: 8.9 % (ref 3.0–12.0)
Neutro Abs: 4.6 10*3/uL (ref 1.4–7.7)
Neutrophils Relative %: 63.8 % (ref 43.0–77.0)
Platelets: 278 10*3/uL (ref 150.0–400.0)
RBC: 4.4 Mil/uL (ref 3.87–5.11)
RDW: 12.7 % (ref 11.5–15.5)
WBC: 7.2 10*3/uL (ref 4.0–10.5)

## 2020-08-05 LAB — BASIC METABOLIC PANEL WITH GFR
BUN: 17 mg/dL (ref 6–23)
CO2: 27 meq/L (ref 19–32)
Calcium: 10.1 mg/dL (ref 8.4–10.5)
Chloride: 99 meq/L (ref 96–112)
Creatinine, Ser: 0.77 mg/dL (ref 0.40–1.20)
GFR: 79.02 mL/min
Glucose, Bld: 93 mg/dL (ref 70–99)
Potassium: 4.4 meq/L (ref 3.5–5.1)
Sodium: 136 meq/L (ref 135–145)

## 2020-08-05 LAB — LIPID PANEL
Cholesterol: 169 mg/dL (ref 0–200)
HDL: 47 mg/dL
LDL Cholesterol: 94 mg/dL (ref 0–99)
NonHDL: 122.24
Total CHOL/HDL Ratio: 4
Triglycerides: 139 mg/dL (ref 0.0–149.0)
VLDL: 27.8 mg/dL (ref 0.0–40.0)

## 2020-08-05 LAB — HEPATIC FUNCTION PANEL
ALT: 33 U/L (ref 0–35)
AST: 26 U/L (ref 0–37)
Albumin: 4.7 g/dL (ref 3.5–5.2)
Alkaline Phosphatase: 58 U/L (ref 39–117)
Bilirubin, Direct: 0.1 mg/dL (ref 0.0–0.3)
Total Bilirubin: 0.6 mg/dL (ref 0.2–1.2)
Total Protein: 6.9 g/dL (ref 6.0–8.3)

## 2020-08-05 LAB — HEMOGLOBIN A1C: Hgb A1c MFr Bld: 5.7 % (ref 4.6–6.5)

## 2020-08-26 ENCOUNTER — Encounter: Payer: Self-pay | Admitting: Urology

## 2020-08-26 ENCOUNTER — Other Ambulatory Visit: Payer: Self-pay

## 2020-08-26 ENCOUNTER — Ambulatory Visit: Payer: Medicare Other | Admitting: Urology

## 2020-08-26 VITALS — BP 119/75 | HR 64 | Ht 62.0 in | Wt 147.0 lb

## 2020-08-26 DIAGNOSIS — N200 Calculus of kidney: Secondary | ICD-10-CM | POA: Diagnosis not present

## 2020-08-26 DIAGNOSIS — N281 Cyst of kidney, acquired: Secondary | ICD-10-CM

## 2020-08-26 LAB — URINALYSIS, COMPLETE
Bilirubin, UA: NEGATIVE
Glucose, UA: NEGATIVE
Ketones, UA: NEGATIVE
Leukocytes,UA: NEGATIVE
Nitrite, UA: NEGATIVE
Protein,UA: NEGATIVE
RBC, UA: NEGATIVE
Specific Gravity, UA: 1.01 (ref 1.005–1.030)
Urobilinogen, Ur: 0.2 mg/dL (ref 0.2–1.0)
pH, UA: 7 (ref 5.0–7.5)

## 2020-08-26 LAB — MICROSCOPIC EXAMINATION: RBC, Urine: NONE SEEN /hpf (ref 0–2)

## 2020-08-26 NOTE — Progress Notes (Signed)
08/26/2020 11:16 AM   Kara Burns [birth year] [id number]  Referring provider: Einar Pheasant, [address] [place],  [place] [address]  Chief Complaint  Patient presents with   renal cyst    HPI: 70 year old female who presents today to establish care for bilateral renal cyst.  She has a significant remote history of renal cyst managed in the remote past by Dr. Quillian Quince about 20 years ago.  She underwent left partial nephrectomy x2 for what sounds like complex renal cyst.  These were benign.  She ultimately ended up with a flank hernia through which she is undergone hernia repair as well as revision.  More recently, she was seeing Dr. Brendia Sacks at [facility].  It looks like she was last seen in 2018.  These records are available in [place] and reviewed personally today.  Most recent imaging in the form of CT abdomen pelvis with contrast on 07/2019 shows bilateral renal cyst, left greater than right measuring up to about 4 cm in largest diameter.  This appears to be simple in nature.  There was a defect on the left kidney consistent with a previous partial nephrectomy inferior laterally.  She denies any overt flank pain.  No gross hematuria.  No urinary issues.  She does have bulging of her left flank area which is very bothersome to her.   PMH: Past Medical History:  Diagnosis Date   Abdominal wall hernia    secondary to large left renal cyst removal   Arthritis    hands   Atrophic vaginitis    Car sickness    Carpal tunnel syndrome    Colon polyps    Complication of anesthesia    slow to wake   Diverticulosis    Fibrocystic breast disease    GERD (gastroesophageal reflux disease)    History of fatty infiltration of liver    Hyperlipidemia    Postmenopausal    Sinusitis     Surgical History: Past Surgical History:  Procedure Laterality Date   ABDOMINAL HYSTERECTOMY  2005   partial   APPENDECTOMY     BLEPHAROPLASTY      Bone Spur     removal - left index finger   CATARACT EXTRACTION, BILATERAL  2000, 2005   COLONOSCOPY WITH PROPOFOL N/A 10/26/2019   Procedure: COLONOSCOPY WITH PROPOFOL;  Surgeon: Lucilla Lame, MD;  Location: [facility];  Service: Endoscopy;  Laterality: N/A;   ESOPHAGOGASTRODUODENOSCOPY (EGD) WITH PROPOFOL N/A 10/26/2019   Procedure: ESOPHAGOGASTRODUODENOSCOPY (EGD) WITH PROPOFOL and Dilation;  Surgeon: Lucilla Lame, MD;  Location: [facility];  Service: Endoscopy;  Laterality: N/A;   EXCISION [facility]'S NEUROMA  [id number]   Dr. Albertine Patricia   HERNIA REPAIR     lumb hernia     RENAL CYST EXCISION     TUBAL LIGATION      Home Medications:  Allergies as of 08/26/2020       Reactions   Erythromycin Diarrhea   Erythromycin Ethylsuccinate         Medication List        Accurate as of August 26, 2020 11:16 AM. If you have any questions, ask your nurse or doctor.          STOP taking these medications    albuterol 108 (90 Base) MCG/ACT inhaler Commonly known as: VENTOLIN HFA Stopped by: Hollice Espy, MD   buPROPion 150 MG 24 hr tablet Commonly known as: WELLBUTRIN XL Stopped by: Hollice Espy, MD  TAKE these medications    ALPRAZolam 0.25 MG tablet Commonly known as: XANAX TAKE 1 TABLET BY MOUTH DAILY AS NEEDED   esomeprazole 40 MG capsule Commonly known as: NEXIUM TAKE 1 CAPSULE (40 MG) BY MOUTH EVERY DAY   FISH OIL PO Take by mouth daily.   MILK THISTLE PO Take by mouth 2 (two) times daily.   Probiotic-10 Chew Chew by mouth daily.   sertraline 50 MG tablet Commonly known as: ZOLOFT TAKE ONE TABLET BY MOUTH EVERY DAY        Allergies:  Allergies  Allergen Reactions   Erythromycin Diarrhea   Erythromycin Ethylsuccinate     Family History: Family History  Problem Relation Age of Onset   Cancer Father        prostate   Lung cancer Other        parent   Hypercholesterolemia Other        parent    Social History:   reports that she has quit smoking. She has never used smokeless tobacco. She reports current alcohol use of about 5.0 - 6.0 standard drinks of alcohol per week. She reports that she does not use drugs.   Physical Exam: BP 119/75   Pulse 64   Ht 5\' 2"  (1.575 m)   Wt 147 lb (66.7 kg)   LMP 02/28/1990   BMI 26.89 kg/m   Constitutional:  Alert and oriented, No acute distress. HEENT: [place] AT, moist mucus membranes.  Trachea midline, no masses. Cardiovascular: No clubbing, cyanosis, or edema. Respiratory: Normal respiratory effort, no increased work of breathing. Skin: No rashes, bruises or suspicious lesions. Neurologic: Grossly intact, no focal deficits, moving all 4 extremities. Psychiatric: Normal mood and affect.  Laboratory Data:  Lab Results  Component Value Date   CREATININE 0.77 08/05/2020   Pertinent Imaging:  IMPRESSION: 1. Bilateral renal cysts but no worrisome renal lesions. 2. Lower pole left renal calculi but no obstructing ureteral calculi or bladder calculi. 3. Large hiatal hernia. 4. Sigmoid colon diverticulosis without findings for acute diverticulitis. 5. Left oblique abdominal muscles are somewhat thickened compared to the right and there is calcifications within the muscles. This may be the result of prior trauma and hemorrhage. No abdominal wall or flank hernia is identified.     Electronically Signed   By: Marijo Sanes M.D.   On: 07/16/2019 15:29  CT scan images were personally reviewed.  Agree with radiologic interpretation.  There is a 2 mm left lower pole stone.  Urinalysis today is negative, see epic  Assessment & Plan:    1. 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  I recommended renal ultrasound in the future to limit the number of CT scans for both exposure as well as cost.  Follow-up next year with a renal ultrasound and then likely every 3 to 5 years thereafter.  She is agreeable this plan.  In the interim, if she develops flank pain or any other symptoms, will reassess sooner. - Urinalysis, Complete - Ultrasound renal complete; Future  2. Kidney stone on left side Incidental 2 mm left lower pole kidney stone, asymptomatic  Recommend observation     Caryl Pina  Erlene Quan, [place] [address] [place], [place] [address] [phone]

## 2020-09-24 DIAGNOSIS — M189 Osteoarthritis of first carpometacarpal joint, unspecified: Secondary | ICD-10-CM | POA: Diagnosis not present

## 2020-09-24 DIAGNOSIS — M1811 Unilateral primary osteoarthritis of first carpometacarpal joint, right hand: Secondary | ICD-10-CM | POA: Diagnosis not present

## 2020-09-24 DIAGNOSIS — M19042 Primary osteoarthritis, left hand: Secondary | ICD-10-CM | POA: Diagnosis not present

## 2020-10-17 ENCOUNTER — Other Ambulatory Visit: Payer: Self-pay | Admitting: Internal Medicine

## 2020-10-24 ENCOUNTER — Other Ambulatory Visit: Payer: Self-pay | Admitting: Internal Medicine

## 2020-11-07 ENCOUNTER — Telehealth: Payer: Self-pay

## 2020-11-07 ENCOUNTER — Other Ambulatory Visit: Payer: Self-pay

## 2020-11-07 ENCOUNTER — Ambulatory Visit: Payer: Medicare Other

## 2020-11-07 NOTE — Telephone Encounter (Signed)
Unable to reach patient for scheduled AWV. No answer. Left voicemail to call the office back and reschedule.

## 2020-11-18 NOTE — Progress Notes (Unsigned)
This encounter was created in error - please disregard.

## 2020-12-01 ENCOUNTER — Encounter: Payer: Medicare Other | Admitting: Internal Medicine

## 2020-12-09 ENCOUNTER — Other Ambulatory Visit: Payer: Self-pay | Admitting: Internal Medicine

## 2020-12-24 DIAGNOSIS — K458 Other specified abdominal hernia without obstruction or gangrene: Secondary | ICD-10-CM | POA: Diagnosis not present

## 2020-12-25 DIAGNOSIS — H43392 Other vitreous opacities, left eye: Secondary | ICD-10-CM | POA: Diagnosis not present

## 2020-12-25 DIAGNOSIS — Z961 Presence of intraocular lens: Secondary | ICD-10-CM | POA: Diagnosis not present

## 2020-12-25 DIAGNOSIS — H04123 Dry eye syndrome of bilateral lacrimal glands: Secondary | ICD-10-CM | POA: Diagnosis not present

## 2020-12-25 DIAGNOSIS — H0288A Meibomian gland dysfunction right eye, upper and lower eyelids: Secondary | ICD-10-CM | POA: Diagnosis not present

## 2020-12-29 DIAGNOSIS — Z86018 Personal history of other benign neoplasm: Secondary | ICD-10-CM | POA: Diagnosis not present

## 2020-12-29 DIAGNOSIS — Z85820 Personal history of malignant melanoma of skin: Secondary | ICD-10-CM | POA: Diagnosis not present

## 2020-12-29 DIAGNOSIS — L578 Other skin changes due to chronic exposure to nonionizing radiation: Secondary | ICD-10-CM | POA: Diagnosis not present

## 2020-12-29 DIAGNOSIS — Z872 Personal history of diseases of the skin and subcutaneous tissue: Secondary | ICD-10-CM | POA: Diagnosis not present

## 2020-12-30 ENCOUNTER — Other Ambulatory Visit: Payer: Self-pay | Admitting: Internal Medicine

## 2020-12-30 ENCOUNTER — Encounter: Payer: Medicare Other | Admitting: Internal Medicine

## 2020-12-30 NOTE — Telephone Encounter (Signed)
Rx ok'd for alprazolam #30 with no refills.  

## 2020-12-30 NOTE — Telephone Encounter (Signed)
RX Refill: xanax Last Seen: 07-28-20 Last Ordered: 02-18-20 Next Appt: 01-12-21

## 2021-01-12 ENCOUNTER — Other Ambulatory Visit: Payer: Self-pay

## 2021-01-12 ENCOUNTER — Encounter: Payer: Self-pay | Admitting: Internal Medicine

## 2021-01-12 ENCOUNTER — Ambulatory Visit (INDEPENDENT_AMBULATORY_CARE_PROVIDER_SITE_OTHER): Payer: Medicare Other | Admitting: Internal Medicine

## 2021-01-12 VITALS — BP 120/68 | HR 75 | Temp 96.0°F | Ht 61.0 in | Wt 147.0 lb

## 2021-01-12 DIAGNOSIS — Z0000 Encounter for general adult medical examination without abnormal findings: Secondary | ICD-10-CM | POA: Diagnosis not present

## 2021-01-12 DIAGNOSIS — R739 Hyperglycemia, unspecified: Secondary | ICD-10-CM | POA: Diagnosis not present

## 2021-01-12 DIAGNOSIS — F1090 Alcohol use, unspecified, uncomplicated: Secondary | ICD-10-CM

## 2021-01-12 DIAGNOSIS — Z789 Other specified health status: Secondary | ICD-10-CM

## 2021-01-12 DIAGNOSIS — Z1231 Encounter for screening mammogram for malignant neoplasm of breast: Secondary | ICD-10-CM

## 2021-01-12 DIAGNOSIS — R7989 Other specified abnormal findings of blood chemistry: Secondary | ICD-10-CM

## 2021-01-12 DIAGNOSIS — K458 Other specified abdominal hernia without obstruction or gangrene: Secondary | ICD-10-CM

## 2021-01-12 DIAGNOSIS — N281 Cyst of kidney, acquired: Secondary | ICD-10-CM

## 2021-01-12 DIAGNOSIS — E7800 Pure hypercholesterolemia, unspecified: Secondary | ICD-10-CM | POA: Diagnosis not present

## 2021-01-12 DIAGNOSIS — K219 Gastro-esophageal reflux disease without esophagitis: Secondary | ICD-10-CM

## 2021-01-12 DIAGNOSIS — F439 Reaction to severe stress, unspecified: Secondary | ICD-10-CM

## 2021-01-12 LAB — LIPID PANEL
Cholesterol: 228 mg/dL — ABNORMAL HIGH (ref 0–200)
HDL: 63.9 mg/dL (ref >39.00)
NonHDL: 163.71
Total CHOL/HDL Ratio: 4
Triglycerides: 201 mg/dL — ABNORMAL HIGH (ref 0.0–149.0)
VLDL: 40.2 mg/dL — ABNORMAL HIGH (ref 0.0–40.0)

## 2021-01-12 LAB — LDL CHOLESTEROL, DIRECT: Direct LDL: 148 mg/dL

## 2021-01-12 LAB — HEPATIC FUNCTION PANEL
ALT: 21 U/L (ref 0–35)
AST: 20 U/L (ref 0–37)
Albumin: 4.5 g/dL (ref 3.5–5.2)
Alkaline Phosphatase: 57 U/L (ref 39–117)
Bilirubin, Direct: 0.1 mg/dL (ref 0.0–0.3)
Total Bilirubin: 0.6 mg/dL (ref 0.2–1.2)
Total Protein: 7 g/dL (ref 6.0–8.3)

## 2021-01-12 LAB — BASIC METABOLIC PANEL
BUN: 18 mg/dL (ref 6–23)
CO2: 28 mEq/L (ref 19–32)
Calcium: 9.9 mg/dL (ref 8.4–10.5)
Chloride: 104 mEq/L (ref 96–112)
Creatinine, Ser: 0.74 mg/dL (ref 0.40–1.20)
GFR: 82.62 mL/min (ref >60.00)
Glucose, Bld: 100 mg/dL — ABNORMAL HIGH (ref 70–99)
Potassium: 4.5 mEq/L (ref 3.5–5.1)
Sodium: 140 mEq/L (ref 135–145)

## 2021-01-12 LAB — TSH: TSH: 1.97 u[IU]/mL (ref 0.35–5.50)

## 2021-01-12 LAB — HEMOGLOBIN A1C: Hgb A1c MFr Bld: 5.8 % (ref 4.6–6.5)

## 2021-01-12 NOTE — Progress Notes (Signed)
Patient ID: MIKAH ROTTINGHAUS, female   DOB: [birth year], 70 y.o.   MRN: [id number]   Subjective:    Patient ID: DYNESHIA BACCAM, female    DOB: 10/24/1950, 70 y.o.   MRN: [id number]  This visit occurred during the SARS-CoV-2 public health emergency.  Safety protocols were in place, including screening questions prior to the visit, additional usage of staff PPE, and extensive cleaning of exam room while observing appropriate contact time as indicated for disinfecting solutions.   Patient here for her physical exam.   Chief Complaint  Patient presents with   Annual Exam    Not fasting    .   HPI Recently saw Dr Erlene Quan - f/u bilateral renal cyst.  Recommended f/u next year with renal ultrasound and then every 3-5 years thereafter.  Saw Dr Eugene Garnet for evaluation of left flank hernia.  Plans f/u 02/2021.  No chest pain or sob reported.  No abdominal pain.  Bowels moving.     Past Medical History:  Diagnosis Date   Abdominal wall hernia    secondary to large left renal cyst removal   Arthritis    hands   Atrophic vaginitis    Car sickness    Carpal tunnel syndrome    Colon polyps    Complication of anesthesia    slow to wake   Diverticulosis    Fibrocystic breast disease    GERD (gastroesophageal reflux disease)    History of fatty infiltration of liver    Hyperlipidemia    Postmenopausal    Sinusitis    Past Surgical History:  Procedure Laterality Date   ABDOMINAL HYSTERECTOMY  2005   partial   APPENDECTOMY     BLEPHAROPLASTY     Bone Spur     removal - left index finger   CATARACT EXTRACTION, BILATERAL  2000, 2005   COLONOSCOPY WITH PROPOFOL N/A 10/26/2019   Procedure: COLONOSCOPY WITH PROPOFOL;  Surgeon: Lucilla Lame, MD;  Location: [facility];  Service: Endoscopy;  Laterality: N/A;   ESOPHAGOGASTRODUODENOSCOPY (EGD) WITH PROPOFOL N/A 10/26/2019   Procedure: ESOPHAGOGASTRODUODENOSCOPY (EGD) WITH PROPOFOL and Dilation;  Surgeon: Lucilla Lame, MD;  Location: [facility];  Service: Endoscopy;  Laterality: N/A;   EXCISION [facility]  [id number]   Dr. Albertine Patricia   HERNIA REPAIR     lumb hernia     RENAL CYST EXCISION     TUBAL LIGATION     Family History  Problem Relation Age of Onset   Cancer Father        prostate   Lung cancer Other        parent   Hypercholesterolemia Other        parent   Social History   Socioeconomic History   Marital status: Married    Spouse name: Not on file   Number of children: 2   Years of education: Not on file   Highest education level: Not on file  Occupational History   Not on file  Tobacco Use   Smoking status: Former   Smokeless tobacco: Never  Vaping Use   Vaping Use: Not on file  Substance and Sexual Activity   Alcohol use: Yes    Alcohol/week: 5.0 - 6.0 standard drinks    Types: 5 - 6 Glasses of wine per week   Drug use: No   Sexual activity: Not on file  Other Topics Concern   Not on file  Social History Narrative   Not on file  Social Determinants of Health   Financial Resource Strain: Not on file  Food Insecurity: Not on file  Transportation Needs: Not on file  Physical Activity: Not on file  Stress: Not on file  Social Connections: Not on file     Review of Systems  Constitutional:  Negative for appetite change and unexpected weight change.  HENT:  Negative for congestion, sinus pressure and sore throat.   Eyes:  Negative for pain and visual disturbance.  Respiratory:  Negative for cough, chest tightness and shortness of breath.   Cardiovascular:  Negative for chest pain, palpitations and leg swelling.  Gastrointestinal:  Negative for abdominal pain, diarrhea, nausea and vomiting.  Genitourinary:  Negative for difficulty urinating and dysuria.  Musculoskeletal:  Negative for joint swelling and myalgias.  Skin:  Negative for color change and rash.  Neurological:  Negative for dizziness, light-headedness and headaches.  Hematological:  Negative for  adenopathy. Does not bruise/bleed easily.  Psychiatric/Behavioral:  Negative for agitation and dysphoric mood.       Objective:     BP 120/68   Pulse 75   Temp (!) 96 F (35.6 C) (Temporal)   Ht 5' 1"  (1.549 m)   Wt 147 lb (66.7 kg)   LMP 02/28/1990   SpO2 97%   BMI 27.78 kg/m  Wt Readings from Last 3 Encounters:  01/12/21 147 lb (66.7 kg)  08/26/20 147 lb (66.7 kg)  07/28/20 151 lb 8 oz (68.7 kg)    Physical Exam Vitals reviewed.  Constitutional:      General: She is not in acute distress.    Appearance: Normal appearance. She is well-developed.  HENT:     Head: Normocephalic and atraumatic.     Right Ear: External ear normal.     Left Ear: External ear normal.  Eyes:     General: No scleral icterus.       Right eye: No discharge.        Left eye: No discharge.     Conjunctiva/sclera: Conjunctivae normal.  Neck:     Thyroid: No thyromegaly.  Cardiovascular:     Rate and Rhythm: Normal rate and regular rhythm.  Pulmonary:     Effort: No tachypnea, accessory muscle usage or respiratory distress.     Breath sounds: Normal breath sounds. No decreased breath sounds or wheezing.  Chest:  Breasts:    Right: No inverted nipple, mass, nipple discharge or tenderness (no axillary adenopathy).     Left: No inverted nipple, mass, nipple discharge or tenderness (no axilarry adenopathy).  Abdominal:     General: Bowel sounds are normal.     Palpations: Abdomen is soft.     Tenderness: There is no abdominal tenderness.     Comments: Left flank hernia. No pain.   Musculoskeletal:        General: No swelling or tenderness.     Cervical back: Neck supple.  Lymphadenopathy:     Cervical: No cervical adenopathy.  Skin:    Findings: No erythema or rash.  Neurological:     Mental Status: She is alert and oriented to person, place, and time.  Psychiatric:        Mood and Affect: Mood normal.        Behavior: Behavior normal.     Outpatient Encounter Medications as of  01/12/2021  Medication Sig   ALPRAZolam (XANAX) 0.25 MG tablet TAKE 1 TABLET BY MOUTH DAILY AS NEEDED   MILK THISTLE PO Take by mouth 2 (two) times daily.  Probiotic Product (PROBIOTIC-10) CHEW Chew by mouth daily.   rosuvastatin (CRESTOR) 5 MG tablet Take 1 tablet (5 mg total) by mouth daily.   [DISCONTINUED] esomeprazole (NEXIUM) 40 MG capsule TAKE 1 CAPSULE (40 MG) BY MOUTH EVERY DAY   [DISCONTINUED] sertraline (ZOLOFT) 50 MG tablet TAKE 1 TABLET BY MOUTH DAILY   [DISCONTINUED] Omega-3 Fatty Acids (FISH OIL PO) Take by mouth daily. (Patient not taking: Reported on 01/12/2021)   No facility-administered encounter medications on file as of 01/12/2021.     Lab Results  Component Value Date   WBC 7.2 08/05/2020   HGB 14.4 08/05/2020   HCT 41.9 08/05/2020   PLT 278.0 08/05/2020   GLUCOSE 100 (H) 01/12/2021   CHOL 228 (H) 01/12/2021   TRIG 201.0 (H) 01/12/2021   HDL 63.90 01/12/2021   LDLDIRECT 148.0 01/12/2021   LDLCALC 94 08/05/2020   ALT 21 01/12/2021   AST 20 01/12/2021   NA 140 01/12/2021   K 4.5 01/12/2021   CL 104 01/12/2021   CREATININE 0.74 01/12/2021   BUN 18 01/12/2021   CO2 28 01/12/2021   TSH 1.97 01/12/2021   HGBA1C 5.8 01/12/2021       Assessment & Plan:   Problem List Items Addressed This Visit     Abnormal liver function test    Diet and exercise. Follow liver function tests.  She has decreased alcohol intake.        Alcohol use ([place])    Has decreased alcohol intake.  Follow.       GERD (gastroesophageal reflux disease)    No upper symptoms reported.  On nexium.       Health care maintenance    Physical today 01/12/21.  Mammogram 02/19/20 - [facility] I ([facility]).  Colonoscopy 10/26/19 - pathology - tubular adenoma.  Recommended f/u colonoscopy in 7 years.       Hernia of flank    Saw Dr Eugene Garnet.  Has f/u planned 02/2021.       Hypercholesterolemia    Low cholesterol diet and exercise.  Follow lipid panel.       Relevant Medications   rosuvastatin  (CRESTOR) 5 MG tablet   Other Relevant Orders   TSH (Completed)   Lipid panel (Completed)   Basic metabolic panel (Completed)   Hepatic function panel (Completed)   Hepatic function panel   Hyperglycemia    Low carb diet and exercise.  Follow met b and a1c.       Relevant Orders   Hemoglobin A1c (Completed)   Basic metabolic panel (Completed)   Renal cyst    Just saw Dr Erlene Quan.  Recommended f/u renal ultrasound in one year then q 3-5 years.        Stress    Has been on zoloft.  Overall appears to be doing well.  Follow.       Other Visit Diagnoses     Routine general medical examination at a health care facility    -  Primary   Encounter for screening mammogram for malignant neoplasm of breast       Relevant Orders   MM Digital Screening        Einar Pheasant, MD

## 2021-01-12 NOTE — Assessment & Plan Note (Signed)
Physical today 01/12/21.  Mammogram 02/19/20 - [facility] I ([facility]).  Colonoscopy 10/26/19 - pathology - tubular adenoma.  Recommended f/u colonoscopy in 7 years.

## 2021-01-15 ENCOUNTER — Other Ambulatory Visit: Payer: Self-pay | Admitting: Internal Medicine

## 2021-01-15 MED ORDER — ROSUVASTATIN CALCIUM 5 MG PO TABS
5.0000 mg | ORAL_TABLET | Freq: Every day | ORAL | 3 refills | Status: DC
Start: 2021-01-15 — End: 2021-08-14

## 2021-01-17 ENCOUNTER — Encounter: Payer: Self-pay | Admitting: Internal Medicine

## 2021-01-17 DIAGNOSIS — K458 Other specified abdominal hernia without obstruction or gangrene: Secondary | ICD-10-CM | POA: Insufficient documentation

## 2021-01-17 NOTE — Assessment & Plan Note (Signed)
Has decreased alcohol intake.  Follow.  

## 2021-01-17 NOTE — Assessment & Plan Note (Signed)
Low cholesterol diet and exercise.  Follow lipid panel.   

## 2021-01-17 NOTE — Assessment & Plan Note (Signed)
Low carb diet and exercise.  Follow met b and a1c.  

## 2021-01-17 NOTE — Assessment & Plan Note (Signed)
No upper symptoms reported.  On nexium.   

## 2021-01-17 NOTE — Assessment & Plan Note (Signed)
Just saw Dr Erlene Quan.  Recommended f/u renal ultrasound in one year then q 3-5 years.

## 2021-01-17 NOTE — Assessment & Plan Note (Signed)
Has been on zoloft.  Overall appears to be doing well.  Follow.  

## 2021-01-17 NOTE — Assessment & Plan Note (Signed)
Saw Dr Eugene Garnet.  Has f/u planned 02/2021.

## 2021-01-17 NOTE — Assessment & Plan Note (Signed)
Diet and exercise. Follow liver function tests.  She has decreased alcohol intake.   

## 2021-02-05 ENCOUNTER — Other Ambulatory Visit (INDEPENDENT_AMBULATORY_CARE_PROVIDER_SITE_OTHER): Payer: Medicare Other

## 2021-02-05 ENCOUNTER — Other Ambulatory Visit: Payer: Self-pay

## 2021-02-05 DIAGNOSIS — E7800 Pure hypercholesterolemia, unspecified: Secondary | ICD-10-CM

## 2021-02-05 LAB — HEPATIC FUNCTION PANEL
ALT: 46 U/L — ABNORMAL HIGH (ref 0–35)
AST: 31 U/L (ref 0–37)
Albumin: 4.5 g/dL (ref 3.5–5.2)
Alkaline Phosphatase: 57 U/L (ref 39–117)
Bilirubin, Direct: 0.1 mg/dL (ref 0.0–0.3)
Total Bilirubin: 0.7 mg/dL (ref 0.2–1.2)
Total Protein: 7 g/dL (ref 6.0–8.3)

## 2021-02-06 ENCOUNTER — Other Ambulatory Visit: Payer: Self-pay

## 2021-02-06 DIAGNOSIS — R7989 Other specified abnormal findings of blood chemistry: Secondary | ICD-10-CM

## 2021-02-06 NOTE — Progress Notes (Signed)
Liver function test ordered.

## 2021-02-19 DIAGNOSIS — K458 Other specified abdominal hernia without obstruction or gangrene: Secondary | ICD-10-CM | POA: Diagnosis not present

## 2021-02-23 ENCOUNTER — Other Ambulatory Visit (INDEPENDENT_AMBULATORY_CARE_PROVIDER_SITE_OTHER): Payer: Medicare Other

## 2021-02-23 ENCOUNTER — Other Ambulatory Visit: Payer: Self-pay

## 2021-02-23 DIAGNOSIS — R7989 Other specified abnormal findings of blood chemistry: Secondary | ICD-10-CM | POA: Diagnosis not present

## 2021-02-23 LAB — HEPATIC FUNCTION PANEL
ALT: 24 U/L (ref 0–35)
AST: 20 U/L (ref 0–37)
Albumin: 4.6 g/dL (ref 3.5–5.2)
Alkaline Phosphatase: 54 U/L (ref 39–117)
Bilirubin, Direct: 0.1 mg/dL (ref 0.0–0.3)
Total Bilirubin: 0.7 mg/dL (ref 0.2–1.2)
Total Protein: 7.1 g/dL (ref 6.0–8.3)

## 2021-02-24 ENCOUNTER — Encounter: Payer: Self-pay | Admitting: Internal Medicine

## 2021-02-24 DIAGNOSIS — Z1231 Encounter for screening mammogram for malignant neoplasm of breast: Secondary | ICD-10-CM | POA: Diagnosis not present

## 2021-02-25 ENCOUNTER — Encounter: Payer: Self-pay | Admitting: Internal Medicine

## 2021-02-26 NOTE — Telephone Encounter (Signed)
Patient says that since starting crestor she has been having brain fog intermittently and muscle weakness. Patient confirmed nothing else going on. Patient is going to stop crestor and let me know if sx do not resolve. She would prefer to remain off of cholesterol medication and work on diet and exercise.

## 2021-02-26 NOTE — Telephone Encounter (Signed)
Ok to stop cholesterol medication.  Muscle aches are not uncommon with statin medication, but I don't usually see brain fog.  If any acute change in symptoms or if symptoms persists or worsen, needs to be evaluated.

## 2021-02-26 NOTE — Telephone Encounter (Signed)
Patient is aware of below. 

## 2021-03-11 ENCOUNTER — Other Ambulatory Visit: Payer: Self-pay | Admitting: Internal Medicine

## 2021-03-11 DIAGNOSIS — K458 Other specified abdominal hernia without obstruction or gangrene: Secondary | ICD-10-CM | POA: Diagnosis not present

## 2021-03-11 DIAGNOSIS — Z79899 Other long term (current) drug therapy: Secondary | ICD-10-CM | POA: Diagnosis not present

## 2021-03-11 DIAGNOSIS — F32A Depression, unspecified: Secondary | ICD-10-CM | POA: Diagnosis not present

## 2021-04-14 DIAGNOSIS — K458 Other specified abdominal hernia without obstruction or gangrene: Secondary | ICD-10-CM | POA: Diagnosis not present

## 2021-04-14 DIAGNOSIS — N281 Cyst of kidney, acquired: Secondary | ICD-10-CM | POA: Diagnosis not present

## 2021-04-14 DIAGNOSIS — Z9889 Other specified postprocedural states: Secondary | ICD-10-CM | POA: Diagnosis not present

## 2021-04-14 DIAGNOSIS — K449 Diaphragmatic hernia without obstruction or gangrene: Secondary | ICD-10-CM | POA: Diagnosis not present

## 2021-04-14 DIAGNOSIS — N200 Calculus of kidney: Secondary | ICD-10-CM | POA: Diagnosis not present

## 2021-04-14 DIAGNOSIS — K432 Incisional hernia without obstruction or gangrene: Secondary | ICD-10-CM | POA: Diagnosis not present

## 2021-05-11 ENCOUNTER — Other Ambulatory Visit: Payer: Self-pay | Admitting: Internal Medicine

## 2021-05-12 NOTE — Telephone Encounter (Signed)
Rx ok'd for xanax #30 with no refills.  ?

## 2021-05-13 DIAGNOSIS — K458 Other specified abdominal hernia without obstruction or gangrene: Secondary | ICD-10-CM | POA: Diagnosis not present

## 2021-05-13 DIAGNOSIS — M25561 Pain in right knee: Secondary | ICD-10-CM | POA: Diagnosis not present

## 2021-05-13 DIAGNOSIS — Z9889 Other specified postprocedural states: Secondary | ICD-10-CM | POA: Diagnosis not present

## 2021-05-13 DIAGNOSIS — Z8719 Personal history of other diseases of the digestive system: Secondary | ICD-10-CM | POA: Diagnosis not present

## 2021-05-13 DIAGNOSIS — G8929 Other chronic pain: Secondary | ICD-10-CM | POA: Diagnosis not present

## 2021-05-29 DIAGNOSIS — F32A Depression, unspecified: Secondary | ICD-10-CM | POA: Diagnosis not present

## 2021-05-29 DIAGNOSIS — K458 Other specified abdominal hernia without obstruction or gangrene: Secondary | ICD-10-CM | POA: Diagnosis not present

## 2021-05-29 DIAGNOSIS — N200 Calculus of kidney: Secondary | ICD-10-CM | POA: Diagnosis not present

## 2021-05-29 DIAGNOSIS — F419 Anxiety disorder, unspecified: Secondary | ICD-10-CM | POA: Diagnosis not present

## 2021-05-29 DIAGNOSIS — Z8719 Personal history of other diseases of the digestive system: Secondary | ICD-10-CM | POA: Diagnosis not present

## 2021-05-29 DIAGNOSIS — Z905 Acquired absence of kidney: Secondary | ICD-10-CM | POA: Diagnosis not present

## 2021-05-29 DIAGNOSIS — K660 Peritoneal adhesions (postprocedural) (postinfection): Secondary | ICD-10-CM | POA: Diagnosis not present

## 2021-05-29 DIAGNOSIS — E785 Hyperlipidemia, unspecified: Secondary | ICD-10-CM | POA: Diagnosis not present

## 2021-05-29 DIAGNOSIS — K449 Diaphragmatic hernia without obstruction or gangrene: Secondary | ICD-10-CM | POA: Diagnosis not present

## 2021-05-29 DIAGNOSIS — K219 Gastro-esophageal reflux disease without esophagitis: Secondary | ICD-10-CM | POA: Diagnosis not present

## 2021-05-29 DIAGNOSIS — Z9889 Other specified postprocedural states: Secondary | ICD-10-CM | POA: Diagnosis not present

## 2021-05-29 DIAGNOSIS — G8918 Other acute postprocedural pain: Secondary | ICD-10-CM | POA: Diagnosis not present

## 2021-05-29 DIAGNOSIS — Z79899 Other long term (current) drug therapy: Secondary | ICD-10-CM | POA: Diagnosis not present

## 2021-06-30 DIAGNOSIS — Z48815 Encounter for surgical aftercare following surgery on the digestive system: Secondary | ICD-10-CM | POA: Diagnosis not present

## 2021-07-13 ENCOUNTER — Ambulatory Visit: Payer: Medicare Other | Admitting: Internal Medicine

## 2021-07-16 ENCOUNTER — Other Ambulatory Visit: Payer: Self-pay | Admitting: Internal Medicine

## 2021-07-27 ENCOUNTER — Encounter: Payer: Self-pay | Admitting: Internal Medicine

## 2021-08-12 IMAGING — CT CT ABD-PELV W/ CM
2 of 5 series · 15 of 46 positions shown, 17 images · IV contrast (omnipaque)
Comparison: CT scan from 8224

CLINICAL DATA: Follow-up renal cysts.

EXAM:
CT ABDOMEN AND PELVIS WITH CONTRAST
TECHNIQUE: Multidetector CT imaging of the abdomen and pelvis was performed
using the standard protocol following bolus administration of
intravenous contrast.
CONTRAST:  100mL OMNIPAQUE IOHEXOL 300 MG/ML  SOLN

[Series 2: abd pelvis 5.00 · axial · 0.67mm/px · z∈[-1435,-1055]mm · 12 of 88 slices shown, 14 images]
[im 6/88  soft-tissue]
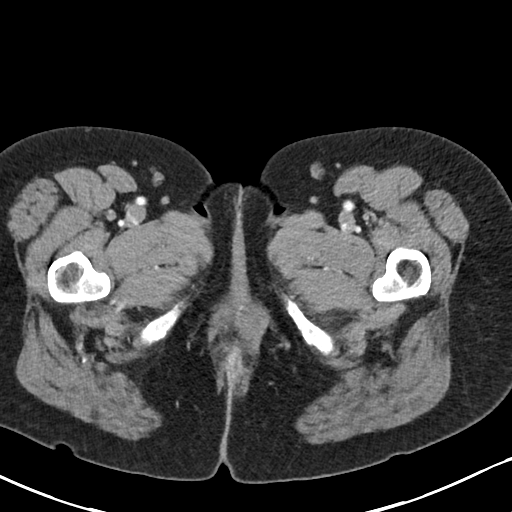
[im 6/88  bone]
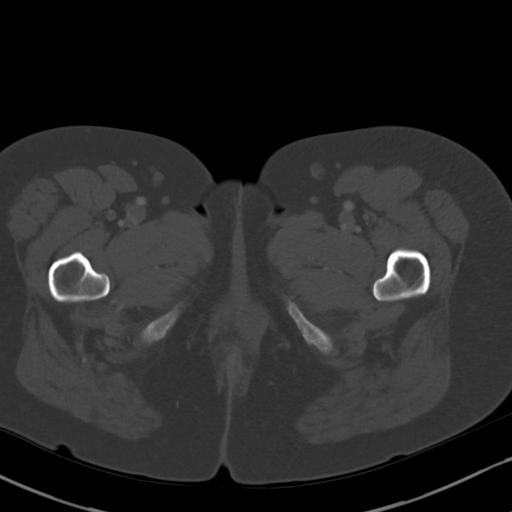
[im 16/88  soft-tissue]
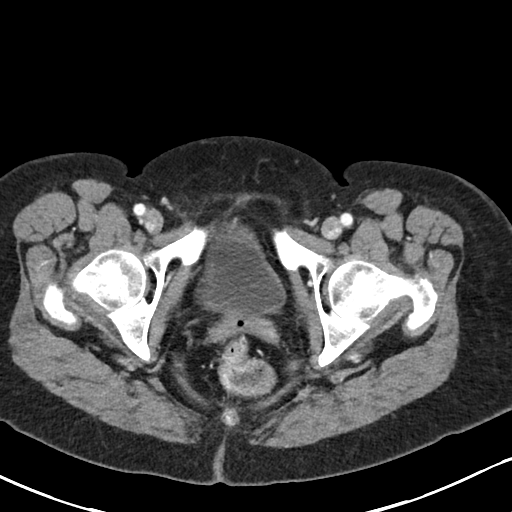
[im 21/88  soft-tissue]
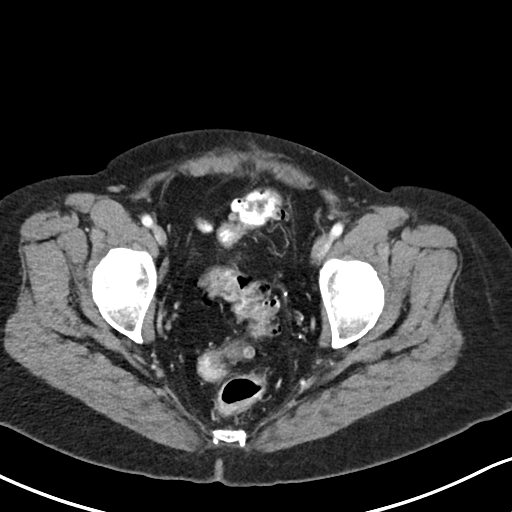
[im 26/88  soft-tissue]
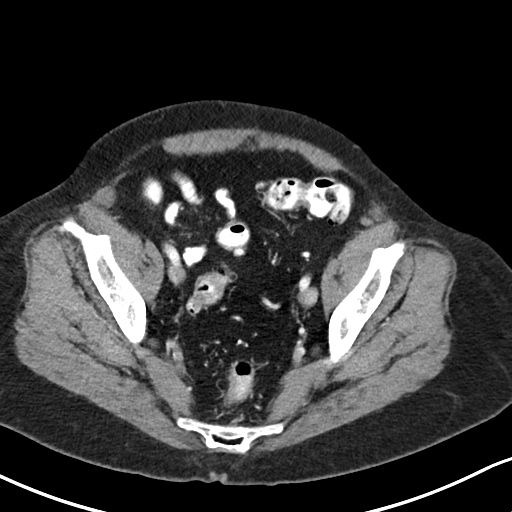
[im 36/88  soft-tissue]
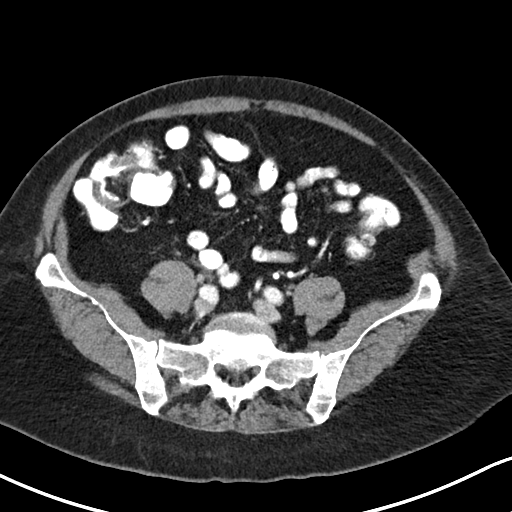
[im 41/88  soft-tissue]
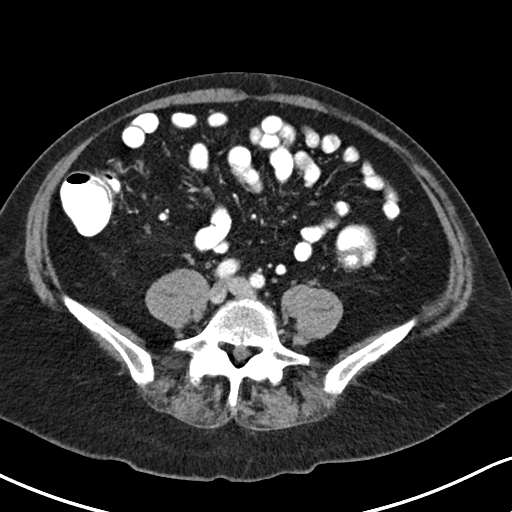
[im 47/88  soft-tissue]
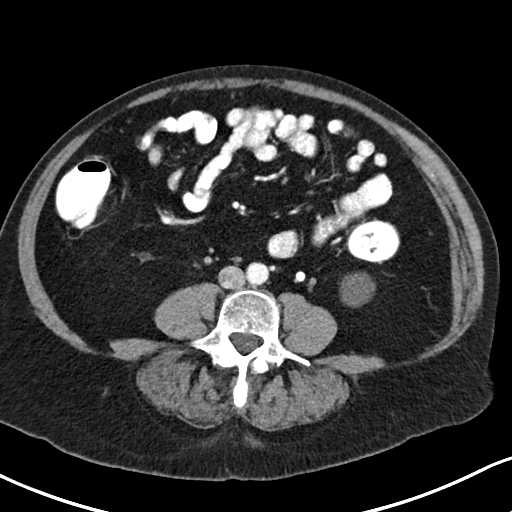
[im 57/88  soft-tissue]
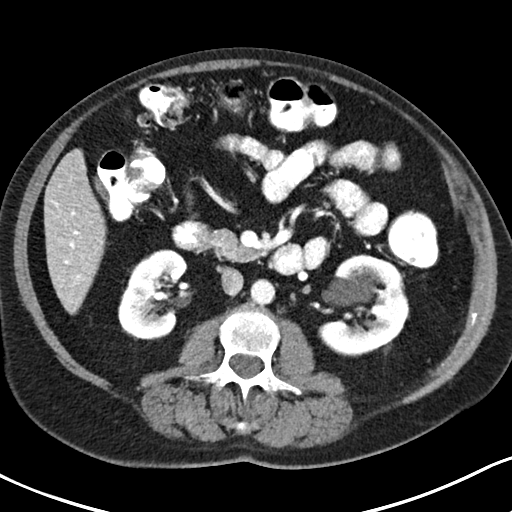
[im 62/88  soft-tissue]
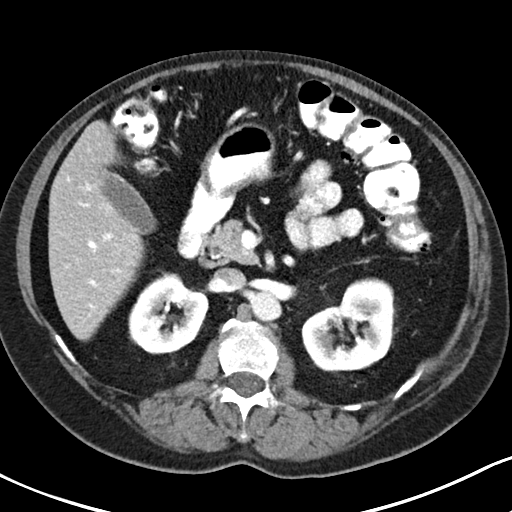
[im 62/88  bone]
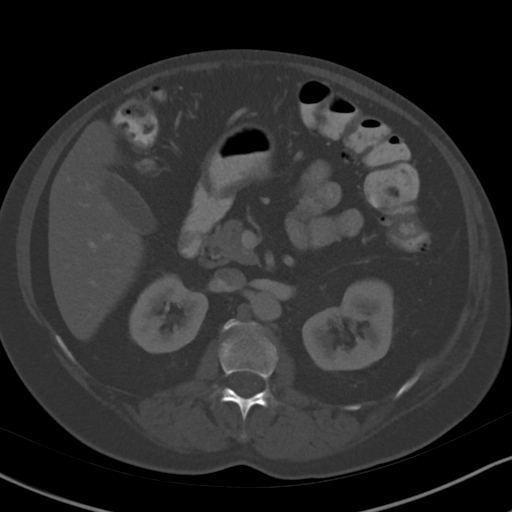
[im 67/88  soft-tissue]
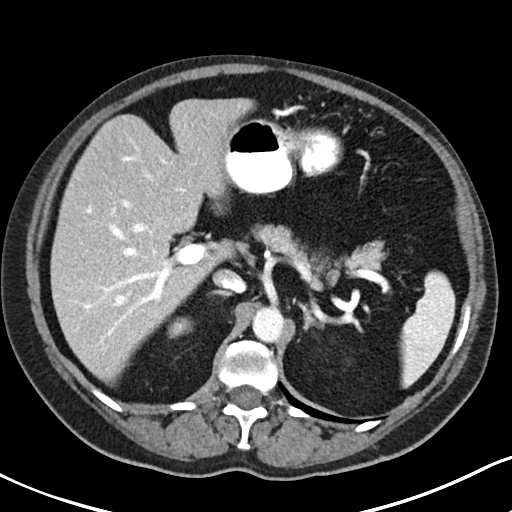
[im 77/88  soft-tissue]
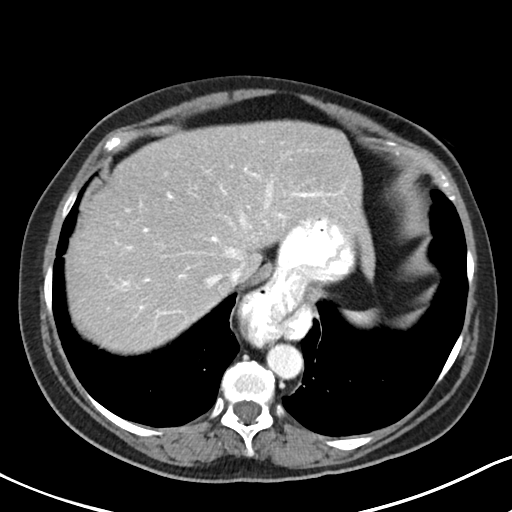
[im 82/88  soft-tissue]
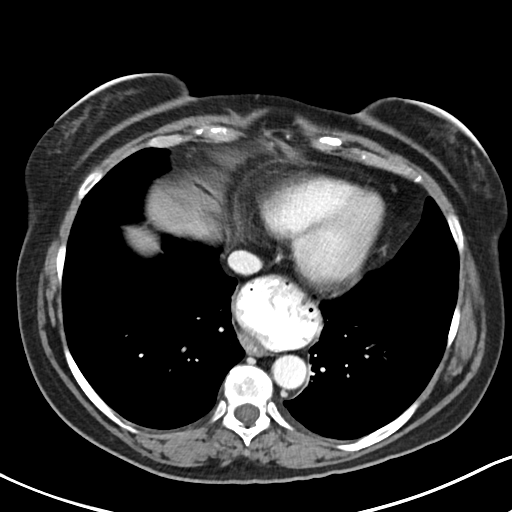

[Series 4: coronals abd pelvis 2.00 cor · coronal · 0.67mm/px · 3 of 149 slices shown]
[im 50/149  soft-tissue]
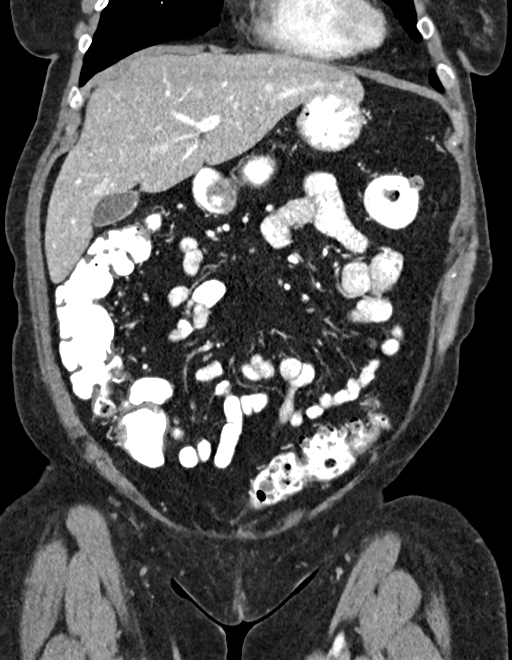
[im 66/149  soft-tissue]
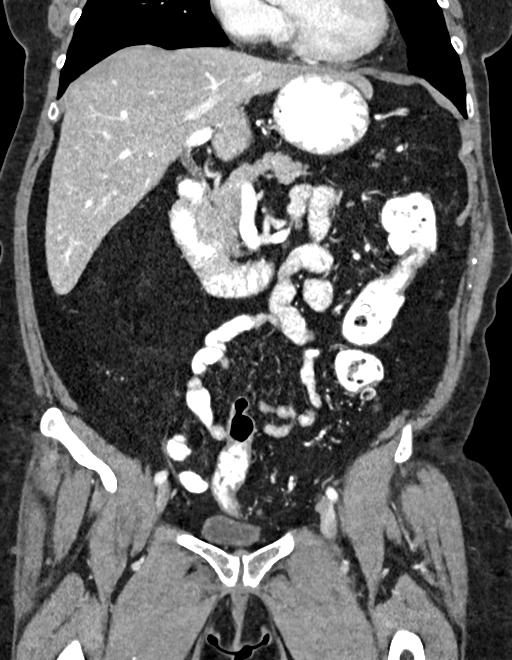
[im 83/149  soft-tissue]
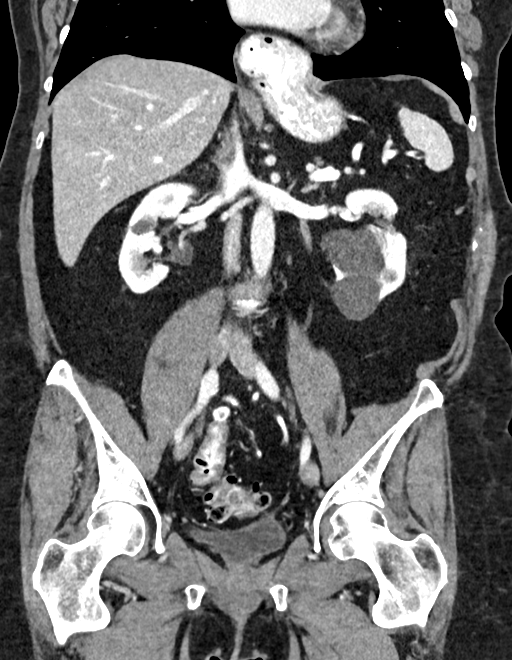

[15 of 46 positions shown; findings below may reference images not displayed]

FINDINGS: Lower chest: The lung bases are clear of acute process. No
infiltrates or effusions. No worrisome pulmonary lesions. Mild lower
lobe bronchiectasis noted. There is a large hiatal hernia.

Hepatobiliary: No hepatic lesions or intrahepatic biliary
dilatation. The gallbladder is normal. No common bile duct
dilatation.

Pancreas: No mass, inflammation or ductal dilatation.

Spleen: Normal size.  No focal lesions.

Adrenals/Urinary Tract: The adrenal glands are unremarkable.

There are several bilateral renal cysts but no worrisome renal
lesions. Lower pole left renal calculus is noted. There is a
cortical defect involving the midpole region of the left kidney
laterally which could be an old renal infarct or area of possible
prior infection.

Small lower pole left renal calculus. No obstructing ureteral
calculi or bladder calculi. No bladder mass or asymmetric bladder
wall thickening.

Stomach/Bowel: The stomach, duodenum, small bowel and colon are
unremarkable. No acute inflammatory changes, mass lesions or
obstructive findings. The terminal ileum is normal. The appendix is
not identified and is likely surgically absent.

Moderate sigmoid colon diverticulosis but no findings for acute
diverticulitis.

Vascular/Lymphatic: Minimal scattered atherosclerotic calcifications
involving the aorta and iliac arteries. No aneurysm or dissection.
The branch vessels are patent. The major venous structures are
patent. No mesenteric or retroperitoneal mass or adenopathy.

Reproductive: The uterus and ovaries are surgically absent.

Other: No pelvic mass or adenopathy. No free pelvic fluid
collections. No inguinal mass or adenopathy. No abdominal wall
hernia or subcutaneous lesions.

The left oblique abdominal muscles are somewhat thickened compared
to the right and there is calcifications within the muscles. This
may be the result of prior trauma and hemorrhage. No abdominal wall
or flank hernia is identified.

Musculoskeletal: No significant bony findings. Moderate facet
disease involving the lower lumbar spine.
IMPRESSION: 1. Bilateral renal cysts but no worrisome renal lesions.
2. Lower pole left renal calculi but no obstructing ureteral calculi
or bladder calculi.
3. Large hiatal hernia.
4. Sigmoid colon diverticulosis without findings for acute
diverticulitis.
5. Left oblique abdominal muscles are somewhat thickened compared to
the right and there is calcifications within the muscles. This may
be the result of prior trauma and hemorrhage. No abdominal wall or
flank hernia is identified.

## 2021-08-13 DIAGNOSIS — D485 Neoplasm of uncertain behavior of skin: Secondary | ICD-10-CM | POA: Diagnosis not present

## 2021-08-13 DIAGNOSIS — L308 Other specified dermatitis: Secondary | ICD-10-CM | POA: Diagnosis not present

## 2021-08-14 ENCOUNTER — Encounter: Payer: Self-pay | Admitting: Internal Medicine

## 2021-08-14 ENCOUNTER — Ambulatory Visit (INDEPENDENT_AMBULATORY_CARE_PROVIDER_SITE_OTHER): Payer: Medicare Other | Admitting: Internal Medicine

## 2021-08-14 VITALS — BP 130/80 | HR 71 | Temp 98.4°F | Resp 18 | Ht 61.0 in | Wt 155.8 lb

## 2021-08-14 DIAGNOSIS — R7989 Other specified abnormal findings of blood chemistry: Secondary | ICD-10-CM

## 2021-08-14 DIAGNOSIS — N281 Cyst of kidney, acquired: Secondary | ICD-10-CM

## 2021-08-14 DIAGNOSIS — R739 Hyperglycemia, unspecified: Secondary | ICD-10-CM

## 2021-08-14 DIAGNOSIS — Z1211 Encounter for screening for malignant neoplasm of colon: Secondary | ICD-10-CM

## 2021-08-14 DIAGNOSIS — F439 Reaction to severe stress, unspecified: Secondary | ICD-10-CM

## 2021-08-14 DIAGNOSIS — E7800 Pure hypercholesterolemia, unspecified: Secondary | ICD-10-CM | POA: Diagnosis not present

## 2021-08-14 DIAGNOSIS — K219 Gastro-esophageal reflux disease without esophagitis: Secondary | ICD-10-CM | POA: Diagnosis not present

## 2021-08-14 DIAGNOSIS — F1090 Alcohol use, unspecified, uncomplicated: Secondary | ICD-10-CM

## 2021-08-14 DIAGNOSIS — Z789 Other specified health status: Secondary | ICD-10-CM

## 2021-08-14 NOTE — Progress Notes (Signed)
[name], female   DOB: 07-30-1950, 71 y.o.   MRN: [id number]   Subjective:    [name], female    DOB: 01-03-1951, 71 y.o.   MRN: [id number]   Patient here for a scheduled follow up .   HPI Here to follow up regarding her cholesterol, stress and recent hernia surgery.  She is doing well.  Surgery went well.  No increased pain.  Eating and drinking ok.  Bowels moving.  No chest pain or sob reported.  Handling stress.  Taking zoloft.  Doing well on this medication.  Has decreased alcohol intake.  Rarely uses xanax.    Past Medical History:  Diagnosis Date   Abdominal wall hernia    secondary to large left renal cyst removal   Arthritis    hands   Atrophic vaginitis    Car sickness    Carpal tunnel syndrome    Colon polyps    Complication of anesthesia    slow to wake   Diverticulosis    Fibrocystic breast disease    GERD (gastroesophageal reflux disease)    History of fatty infiltration of liver    Hyperlipidemia    Postmenopausal    Sinusitis    Past Surgical History:  Procedure Laterality Date   ABDOMINAL HYSTERECTOMY  2005   partial   APPENDECTOMY     BLEPHAROPLASTY     Bone Spur     removal - left index finger   CATARACT EXTRACTION, BILATERAL  2000, 2005   COLONOSCOPY WITH PROPOFOL N/A 10/26/2019   Procedure: COLONOSCOPY WITH PROPOFOL;  Surgeon: Lucilla Lame, MD;  Location: [facility];  Service: Endoscopy;  Laterality: N/A;   ESOPHAGOGASTRODUODENOSCOPY (EGD) WITH PROPOFOL N/A 10/26/2019   Procedure: ESOPHAGOGASTRODUODENOSCOPY (EGD) WITH PROPOFOL and Dilation;  Surgeon: Lucilla Lame, MD;  Location: [facility];  Service: Endoscopy;  Laterality: N/A;   EXCISION [facility]  [id number]   Dr. Albertine Patricia   HERNIA REPAIR     lumb hernia     RENAL CYST EXCISION     TUBAL LIGATION     Family History  Problem Relation Age of Onset   Cancer Father        prostate   Lung cancer Other        parent    Hypercholesterolemia Other        parent   Social History   Socioeconomic History   Marital status: Married    Spouse name: Not on file   Number of children: 2   Years of education: Not on file   Highest education level: Not on file  Occupational History   Not on file  Tobacco Use   Smoking status: Former   Smokeless tobacco: Never  Vaping Use   Vaping Use: Not on file  Substance and Sexual Activity   Alcohol use: Yes    Alcohol/week: 5.0 - 6.0 standard drinks of alcohol    Types: 5 - 6 Glasses of wine per week   Drug use: No   Sexual activity: Not on file  Other Topics Concern   Not on file  Social History Narrative   Not on file   Social Determinants of Health   Financial Resource Strain: Low Risk  (11/07/2019)   Overall Financial Resource Strain (CARDIA)    Difficulty of Paying Living Expenses: Not hard at all  Food Insecurity: No Food Insecurity (11/07/2019)   Hunger Vital Sign    Worried About Running Out  of Food in the Last Year: Never true    [place] in the Last Year: Never true  Transportation Needs: No Transportation Needs (11/07/2019)   PRAPARE - Hydrologist (Medical): No    Lack of Transportation (Non-Medical): No  Physical Activity: Sufficiently Active (11/07/2019)   Exercise Vital Sign    Days of Exercise per Week: 5 days    Minutes of Exercise per Session: 30 min  Stress: No Stress Concern Present (11/07/2019)   [place]    Feeling of Stress : Not at all  Social Connections: Not on file     Review of Systems  Constitutional:  Negative for appetite change and unexpected weight change.  HENT:  Negative for congestion and sinus pressure.   Respiratory:  Negative for cough, chest tightness and shortness of breath.   Cardiovascular:  Negative for chest pain, palpitations and leg swelling.  Gastrointestinal:  Negative for abdominal pain, diarrhea,  nausea and vomiting.  Genitourinary:  Negative for difficulty urinating and dysuria.  Musculoskeletal:  Negative for joint swelling and myalgias.  Skin:  Negative for color change and rash.  Neurological:  Negative for dizziness, light-headedness and headaches.  Psychiatric/Behavioral:  Negative for agitation and dysphoric mood.        Objective:     BP 130/80 (BP Location: Left Arm, Patient Position: Sitting, Cuff Size: Small)   Pulse 71   Temp 98.4 F (36.9 C) (Temporal)   Resp 18   Ht 5' 1"  (1.549 m)   Wt 155 lb 12.8 oz (70.7 kg)   LMP 02/28/1990   SpO2 98%   BMI 29.44 kg/m  Wt Readings from Last 3 Encounters:  08/14/21 155 lb 12.8 oz (70.7 kg)  01/12/21 147 lb (66.7 kg)  08/26/20 147 lb (66.7 kg)    Physical Exam Vitals reviewed.  Constitutional:      General: She is not in acute distress.    Appearance: Normal appearance.  HENT:     Head: Normocephalic and atraumatic.     Right Ear: External ear normal.     Left Ear: External ear normal.  Eyes:     General: No scleral icterus.       Right eye: No discharge.        Left eye: No discharge.     Conjunctiva/sclera: Conjunctivae normal.  Neck:     Thyroid: No thyromegaly.  Cardiovascular:     Rate and Rhythm: Normal rate and regular rhythm.  Pulmonary:     Effort: No respiratory distress.     Breath sounds: Normal breath sounds. No wheezing.  Abdominal:     General: Bowel sounds are normal.     Palpations: Abdomen is soft.     Tenderness: There is no abdominal tenderness.     Comments: Well healed incision site.   Musculoskeletal:        General: No swelling or tenderness.     Cervical back: Neck supple. No tenderness.  Lymphadenopathy:     Cervical: No cervical adenopathy.  Skin:    Findings: No erythema or rash.  Neurological:     Mental Status: She is alert.  Psychiatric:        Mood and Affect: Mood normal.        Behavior: Behavior normal.      Outpatient Encounter Medications as of 08/14/2021   Medication Sig   ALPRAZolam (XANAX) 0.25 MG tablet TAKE 1 TABLET BY  MOUTH DAILY AS NEEDED   esomeprazole (NEXIUM) 40 MG capsule TAKE 1 CAPSULE (40 MG) BY MOUTH EVERY DAY   MILK THISTLE PO Take by mouth 2 (two) times daily.   Probiotic Product (PROBIOTIC-10) CHEW Chew by mouth daily.   sertraline (ZOLOFT) 50 MG tablet TAKE 1 TABLET BY MOUTH DAILY   [DISCONTINUED] rosuvastatin (CRESTOR) 5 MG tablet Take 1 tablet (5 mg total) by mouth daily.   No facility-administered encounter medications on file as of 08/14/2021.     Lab Results  Component Value Date   WBC 7.2 08/05/2020   HGB 14.4 08/05/2020   HCT 41.9 08/05/2020   PLT 278.0 08/05/2020   GLUCOSE 100 (H) 01/12/2021   CHOL 228 (H) 01/12/2021   TRIG 201.0 (H) 01/12/2021   HDL 63.90 01/12/2021   LDLDIRECT 148.0 01/12/2021   LDLCALC 94 08/05/2020   ALT 24 02/23/2021   AST 20 02/23/2021   NA 140 01/12/2021   K 4.5 01/12/2021   CL 104 01/12/2021   CREATININE 0.74 01/12/2021   BUN 18 01/12/2021   CO2 28 01/12/2021   TSH 1.97 01/12/2021   HGBA1C 5.8 01/12/2021       Assessment & Plan:   Problem List Items Addressed This Visit     Abnormal liver function test    Diet and exercise. Follow liver function tests.  She has decreased alcohol intake.        Alcohol use ([place])    Has decreased alcohol intake.  Follow.       Colon cancer screening    Colonoscopy 10/26/19 - pathology - tubular adenoma. Recommended f/u colonoscopy in 7 years.        GERD (gastroesophageal reflux disease)    No upper symptoms reported.  On nexium.       Hypercholesterolemia    Low cholesterol diet and exercise.  Follow lipid panel. Have discussed cholesterol medication.  Desires to hold.       Relevant Orders   Basic Metabolic Panel (BMET)   Lipid Profile   CBC with Differential/Platelet   Hepatic function panel   Basic metabolic panel   Lipid panel   Hyperglycemia    Low carb diet and exercise.  Follow met b and a1c.       Relevant  Orders   HgB A1c   Hemoglobin A1c   Renal cyst    Saw Dr Erlene Quan 08/2020.  Per note review, recommended follow-up next year with a renal ultrasound and then likely every 3 to 5 years thereafter.  needs f/u renal ultrasound.       Stress    Has been on zoloft.  Overall appears to be doing well.  Follow.       Other Visit Diagnoses     Elevated LFTs    -  Primary   Relevant Orders   Hepatic function panel        Einar Pheasant, MD

## 2021-08-16 ENCOUNTER — Telehealth: Payer: Self-pay | Admitting: Internal Medicine

## 2021-08-16 ENCOUNTER — Encounter: Payer: Self-pay | Admitting: Internal Medicine

## 2021-08-16 NOTE — Assessment & Plan Note (Signed)
Low carb diet and exercise.  Follow met b and a1c.  

## 2021-08-16 NOTE — Telephone Encounter (Signed)
Notify Kara Burns that in reviewing, Dr Erlene Quan did recommend a f/u renal ultrasound one year after her visit.  She saw her 08/2020. Due now.  If agreeable, let me know and I will place order for renal ultrasound - to follow up on renaly cyst.

## 2021-08-16 NOTE — Assessment & Plan Note (Signed)
Has been on zoloft.  Overall appears to be doing well.  Follow.  

## 2021-08-16 NOTE — Assessment & Plan Note (Signed)
Saw Dr Erlene Quan 08/2020.  Per note review, recommended follow-up next year with a renal ultrasound and then likely every 3 to 5 years thereafter.  needs f/u renal ultrasound.

## 2021-08-16 NOTE — Assessment & Plan Note (Signed)
No upper symptoms reported.  On nexium.   

## 2021-08-16 NOTE — Assessment & Plan Note (Signed)
Low cholesterol diet and exercise.  Follow lipid panel. Have discussed cholesterol medication.  Desires to hold.

## 2021-08-16 NOTE — Assessment & Plan Note (Signed)
Diet and exercise. Follow liver function tests.  She has decreased alcohol intake.

## 2021-08-16 NOTE — Assessment & Plan Note (Signed)
Has decreased alcohol intake.  Follow.  

## 2021-08-16 NOTE — Assessment & Plan Note (Signed)
Colonoscopy 10/26/19 - pathology - tubular adenoma. Recommended f/u colonoscopy in 7 years.   

## 2021-08-17 ENCOUNTER — Other Ambulatory Visit: Payer: Self-pay

## 2021-08-17 DIAGNOSIS — N281 Cyst of kidney, acquired: Secondary | ICD-10-CM

## 2021-08-17 NOTE — Progress Notes (Signed)
Renal ultrasound was ordered on 08/29/2020. Radiology needs a new order for ultrasound due to patient wanting to have this done at the end of August. Patient scheduled for a follow up with Dr. Erlene Quan in September. New order placed for [place] today.

## 2021-08-20 NOTE — Telephone Encounter (Signed)
Pt has scheduled US renal for 8/30  Has f/u w/ Dr Erlene Quan scheduled 9/5

## 2021-09-01 ENCOUNTER — Other Ambulatory Visit: Payer: Self-pay | Admitting: Internal Medicine

## 2021-09-01 ENCOUNTER — Ambulatory Visit: Payer: Self-pay | Admitting: Urology

## 2021-09-17 ENCOUNTER — Encounter: Payer: Self-pay | Admitting: Internal Medicine

## 2021-10-07 ENCOUNTER — Ambulatory Visit
Admission: RE | Admit: 2021-10-07 | Discharge: 2021-10-07 | Disposition: A | Discharge status: Home / Self Care | Payer: Medicare Other | Source: Ambulatory Visit | Attending: Urology | Admitting: Urology

## 2021-10-07 DIAGNOSIS — N281 Cyst of kidney, acquired: Secondary | ICD-10-CM | POA: Diagnosis not present

## 2021-10-13 ENCOUNTER — Ambulatory Visit: Payer: Medicare Other | Admitting: Urology

## 2021-10-13 ENCOUNTER — Encounter: Payer: Self-pay | Admitting: Urology

## 2021-10-13 VITALS — BP 154/82 | HR 73 | Ht 61.0 in | Wt 155.0 lb

## 2021-10-13 DIAGNOSIS — N281 Cyst of kidney, acquired: Secondary | ICD-10-CM

## 2021-10-13 NOTE — Progress Notes (Signed)
10/13/2021 11:35 AM   Kara Burns [birth year] [id number]  Referring provider: Einar Pheasant, [address] [place],  [place] [address]  No chief complaint on file.   HPI: 71 year old female who presents today for follow-up of renal cyst.  Urologic history is outlined below.  She has a significant remote history of renal cyst managed in the remote past by Dr. Quillian Quince about 20 years ago.  She underwent left partial nephrectomy x2 for what sounds like complex renal cyst.  These were benign.  She ultimately ended up with a flank hernia through which she is undergone hernia repair as well as revision.  More recently, she was seeing Dr. Brendia Sacks at [facility].  It looks like she was last seen in 2018.  These records are available in [place] and reviewed personally today.   Most recent imaging in the form of CT abdomen pelvis with contrast on 07/2019 shows bilateral renal cyst, left greater than right measuring up to about 4 cm in largest diameter.  This appears to be simple in nature.  There was a defect on the left kidney consistent with a previous partial nephrectomy inferior laterally.  She follows up today with a renal ultrasound.  This shows bilateral renal cyst complex some of which have peripheral calcifications and septa measuring up to 4.5 cm on the right and 2.2 cm on the left.  These ultrasound images were personally reviewed today, agree with radiologic interpretation.  Was also compared to her previous CT of the abdomen from 2021.  She denies any flank pain or urinary issues.  No gross hematuria.  Most recent creatinine 1.76 on 05/2021.   PMH: Past Medical History:  Diagnosis Date   Abdominal wall hernia    secondary to large left renal cyst removal   Arthritis    hands   Atrophic vaginitis    Car sickness    Carpal tunnel syndrome    Colon polyps    Complication of anesthesia    slow to wake   Diverticulosis    Fibrocystic  breast disease    GERD (gastroesophageal reflux disease)    History of fatty infiltration of liver    Hyperlipidemia    Postmenopausal    Sinusitis     Surgical History: Past Surgical History:  Procedure Laterality Date   ABDOMINAL HYSTERECTOMY  2005   partial   APPENDECTOMY     BLEPHAROPLASTY     Bone Spur     removal - left index finger   CATARACT EXTRACTION, BILATERAL  2000, 2005   COLONOSCOPY WITH PROPOFOL N/A 10/26/2019   Procedure: COLONOSCOPY WITH PROPOFOL;  Surgeon: Lucilla Lame, MD;  Location: [place];  Service: Endoscopy;  Laterality: N/A;   ESOPHAGOGASTRODUODENOSCOPY (EGD) WITH PROPOFOL N/A 10/26/2019   Procedure: ESOPHAGOGASTRODUODENOSCOPY (EGD) WITH PROPOFOL and Dilation;  Surgeon: Lucilla Lame, MD;  Location: [place];  Service: Endoscopy;  Laterality: N/A;   EXCISION [facility]'S NEUROMA  [id number]   Dr. Albertine Patricia   HERNIA REPAIR     lumb hernia     RENAL CYST EXCISION     TUBAL LIGATION      Home Medications:  Allergies as of 10/13/2021       Reactions   Erythromycin Diarrhea   Erythromycin Ethylsuccinate         Medication List        Accurate as of October 13, 2021 11:35 AM. If you have any questions, ask your nurse or doctor.  ALPRAZolam 0.25 MG tablet Commonly known as: XANAX TAKE 1 TABLET BY MOUTH DAILY AS NEEDED   esomeprazole 40 MG capsule Commonly known as: NEXIUM TAKE 1 CAPSULE (40 MG) BY MOUTH EVERY DAY   MILK THISTLE PO Take by mouth 2 (two) times daily.   Probiotic-10 Chew Chew by mouth daily.   sertraline 50 MG tablet Commonly known as: ZOLOFT TAKE 1 TABLET BY MOUTH DAILY        Allergies:  Allergies  Allergen Reactions   Erythromycin Diarrhea   Erythromycin Ethylsuccinate     Family History: Family History  Problem Relation Age of Onset   Cancer Father        prostate   Lung cancer Other        parent   Hypercholesterolemia Other        parent    Social History:   reports that she has quit smoking. She has never used smokeless tobacco. She reports current alcohol use of about 5.0 - 6.0 standard drinks of alcohol per week. She reports that she does not use drugs.   Physical Exam: LMP 02/28/1990   Constitutional:  Alert and oriented, No acute distress. HEENT: [place] AT, moist mucus membranes.  Trachea midline, no masses. Cardiovascular: No clubbing, cyanosis, or edema. Skin: No rashes, bruises or suspicious lesions. Neurologic: Grossly intact, no focal deficits, moving all 4 extremities. Psychiatric: Normal mood and affect.  Laboratory Data: Lab Results  Component Value Date   WBC 7.2 08/05/2020   HGB 14.4 08/05/2020   HCT 41.9 08/05/2020   MCV 95.2 08/05/2020   PLT 278.0 08/05/2020    Lab Results  Component Value Date   CREATININE 0.74 01/12/2021    Lab Results  Component Value Date   HGBA1C 5.8 01/12/2021  Pertinent Imaging: Ultrasound renal complete  Narrative CLINICAL DATA:  Renal cysts.  EXAM: RENAL / URINARY TRACT ULTRASOUND COMPLETE  COMPARISON:  CT abdomen and pelvis 07/16/2019  FINDINGS: Right Kidney:  Renal measurements: 10.5 x 4.9 x 4.3 cm = volume: 116 mL. Echogenicity within normal limits. No hydronephrosis. There is a 1.6 x 1.3 x 0.9 cm cyst in the right kidney with thin peripheral calcification. Calcification is new from prior.  Left Kidney:  Renal measurements: 10.7 x 4.7 x 5.0 cm = volume: 131 mL. Echogenicity within normal limits. No hydronephrosis. Complex cystic lesion seen in the inferior pole the right kidney measuring 6.2 x 4.5 x 2.9 cm. This contains thin septations and peripheral calcifications as seen on prior CT. There is an additional 2.5 x 2.3 x 2.1 cm cyst in the inferior pole of the kidney containing a single thin septation. There is an area of cortical scarring in the lateral kidney with adjacent cyst measuring 2.2 x 1.6 x 1.5 cm. There is a simple cyst in the superior pole measuring 2.0 x  2.0 x 1.8 cm. These all appear similar in size compared to the prior study.  Bladder:  Appears normal for degree of bladder distention.  Other:  None.  IMPRESSION: 1. Bilateral renal cysts. Left renal cysts are mildly complex containing thin septations and calcifications. These are likely benign. No follow-up necessary. If there is high clinical concern or risk recommend further evaluation with MRI.   Electronically Signed By: Ronney Asters M.D. On: 10/07/2021 21:05    Assessment & Plan:    1. Renal cyst Bilateral renal cyst of varying complexity, primarily Bosniak 1 and 2  In her history requiring surgical resection of these, do think is  reasonable to continue surveillance via the lesser frequent basis.  We will plan to put her on the recall list in 2 years with renal ultrasound then.  In the interim, if she become symptomatic or has any other concerning issues, advised to return sooner.  Follow-up 2 years with renal ultrasound    Hollice Espy, MD  [facility] [address] [place], [place] [address] [phone]

## 2021-11-23 ENCOUNTER — Other Ambulatory Visit: Payer: Self-pay | Admitting: Internal Medicine

## 2021-12-26 IMAGING — DX DG KNEE 1-2V*R*
2 series · 2 of 2 positions shown · non-contrast
Comparison: None.

CLINICAL DATA: Right knee pain for 1 week.  No injury.

EXAM:
RIGHT KNEE - 1-2 VIEW

[knee ap]
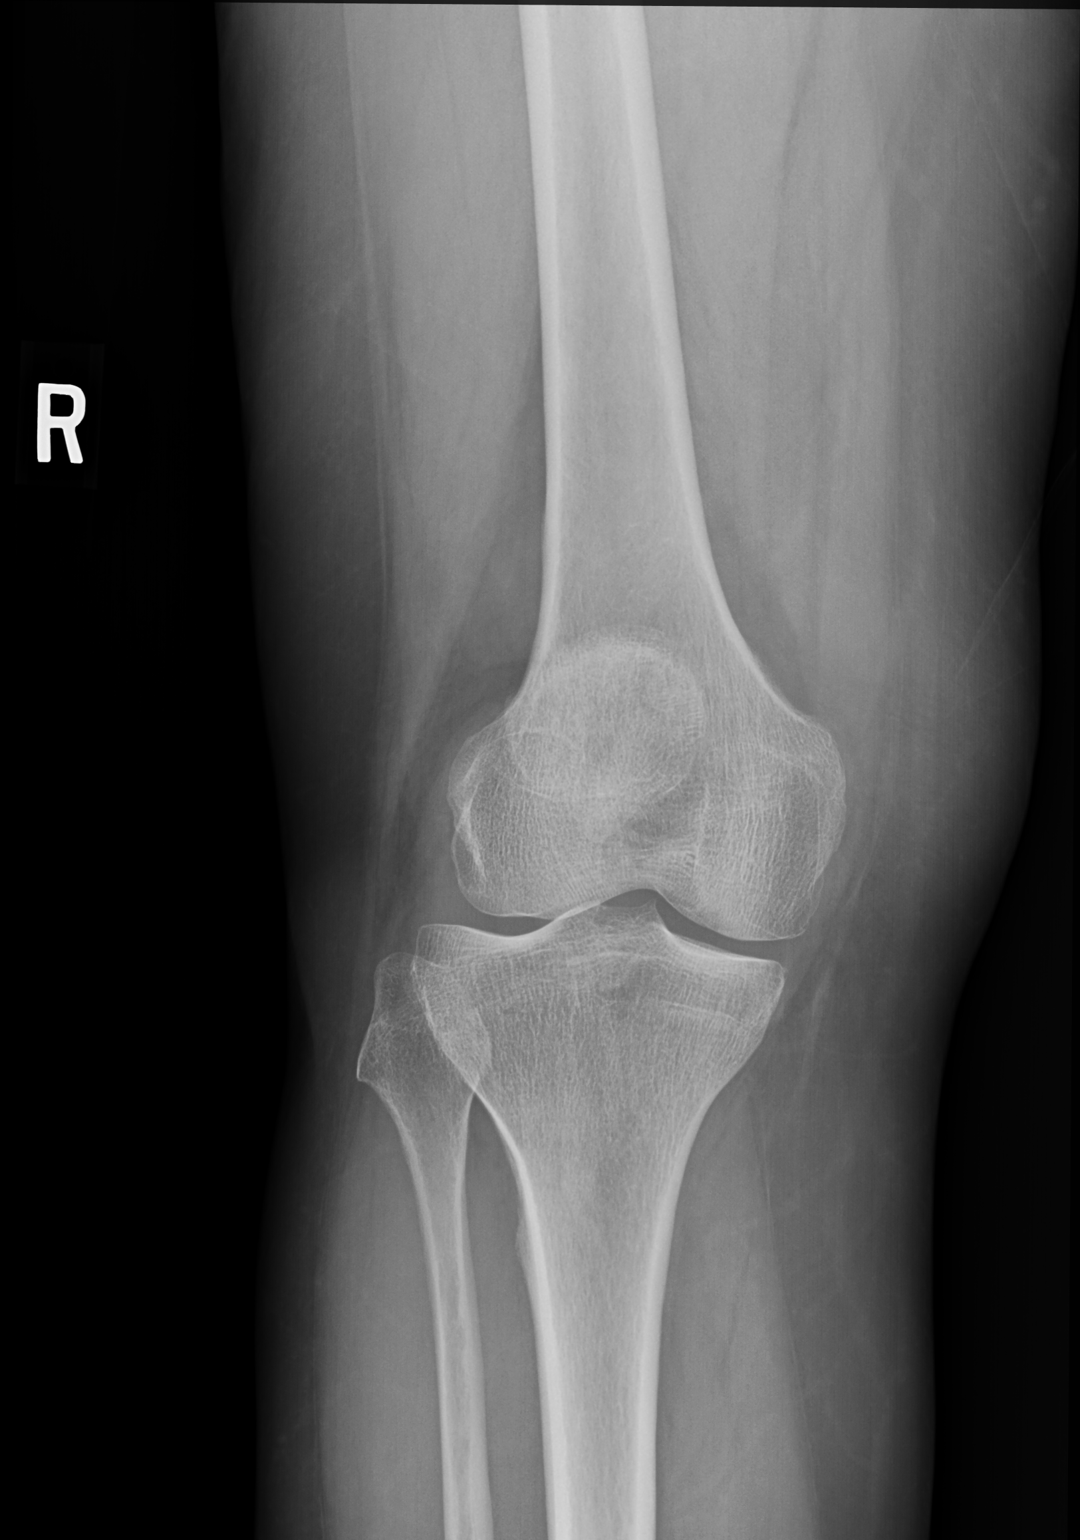

[knee lat]
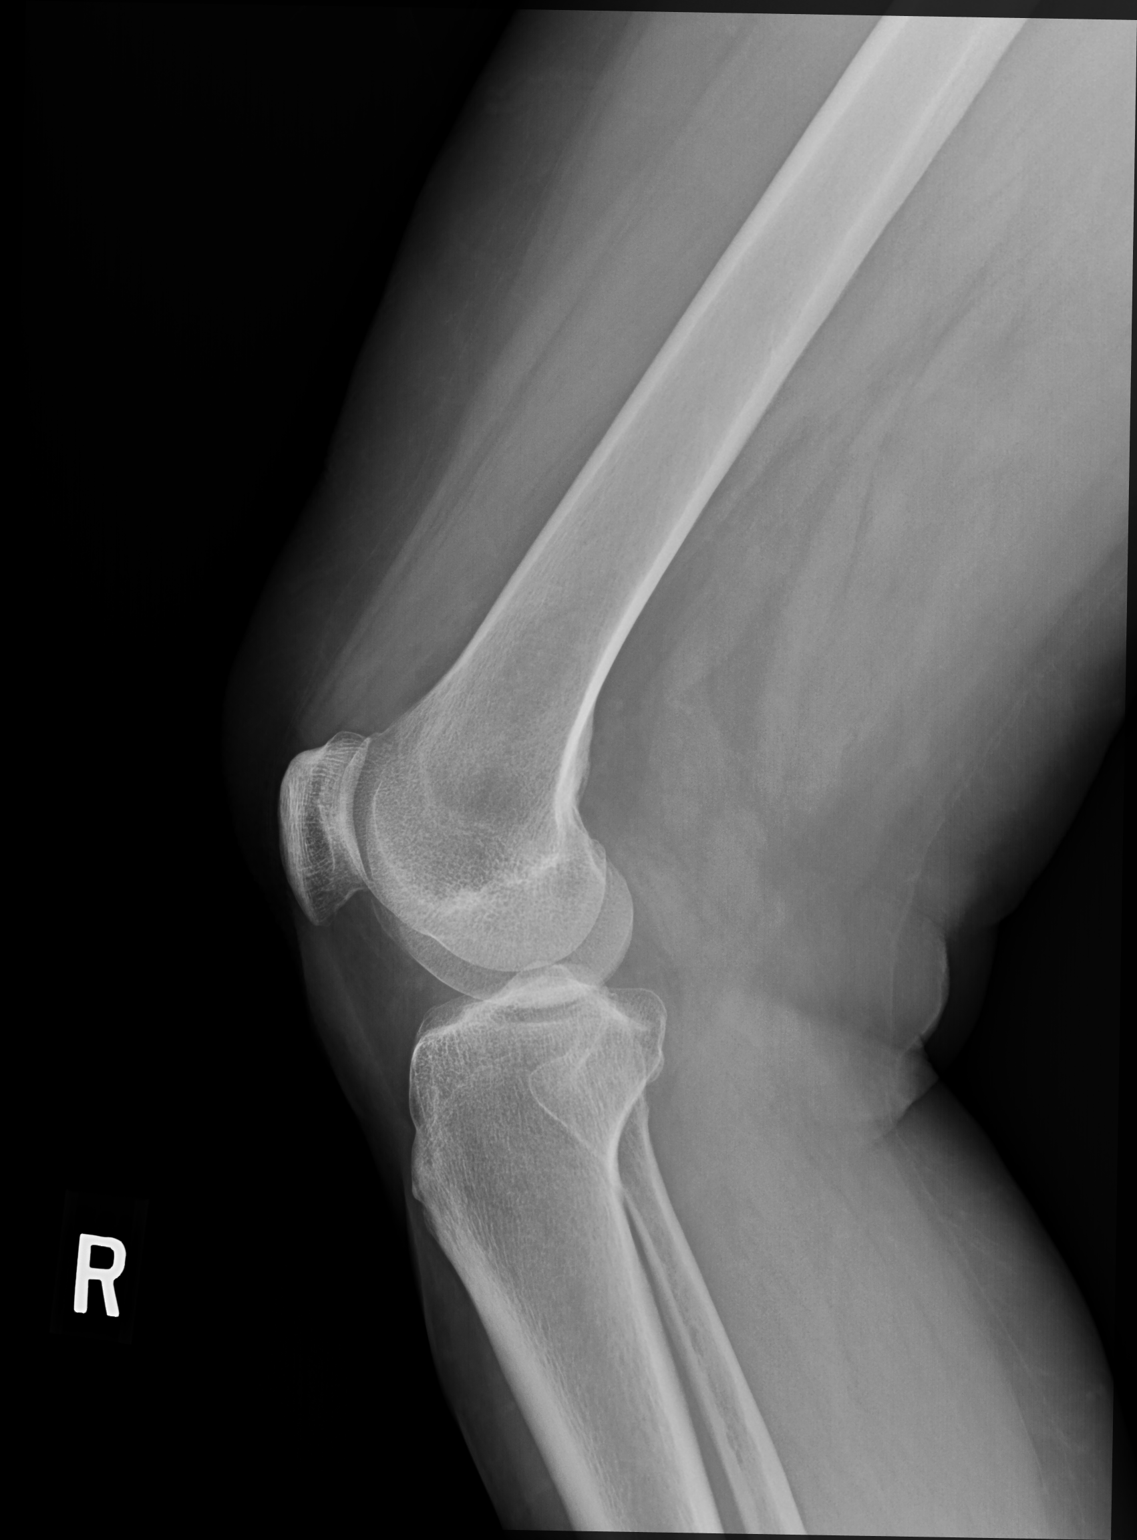

[2 of 2 positions shown; findings below may reference images not displayed]

FINDINGS: No evidence of fracture, dislocation, or joint effusion. No evidence
of arthropathy or other focal bone abnormality. Soft tissues are
unremarkable.
IMPRESSION: Negative.

## 2021-12-29 DIAGNOSIS — L578 Other skin changes due to chronic exposure to nonionizing radiation: Secondary | ICD-10-CM | POA: Diagnosis not present

## 2021-12-29 DIAGNOSIS — Z86018 Personal history of other benign neoplasm: Secondary | ICD-10-CM | POA: Diagnosis not present

## 2021-12-29 DIAGNOSIS — Z85820 Personal history of malignant melanoma of skin: Secondary | ICD-10-CM | POA: Diagnosis not present

## 2021-12-29 DIAGNOSIS — Z872 Personal history of diseases of the skin and subcutaneous tissue: Secondary | ICD-10-CM | POA: Diagnosis not present

## 2022-01-04 ENCOUNTER — Telehealth: Payer: Self-pay

## 2022-01-04 ENCOUNTER — Telehealth (INDEPENDENT_AMBULATORY_CARE_PROVIDER_SITE_OTHER): Payer: Medicare Other | Admitting: Internal Medicine

## 2022-01-04 ENCOUNTER — Encounter: Payer: Self-pay | Admitting: Internal Medicine

## 2022-01-04 VITALS — Ht 61.0 in | Wt 155.0 lb

## 2022-01-04 DIAGNOSIS — R739 Hyperglycemia, unspecified: Secondary | ICD-10-CM

## 2022-01-04 DIAGNOSIS — F439 Reaction to severe stress, unspecified: Secondary | ICD-10-CM | POA: Diagnosis not present

## 2022-01-04 DIAGNOSIS — R051 Acute cough: Secondary | ICD-10-CM

## 2022-01-04 MED ORDER — PREDNISONE 10 MG PO TABS: ORAL_TABLET | ORAL | 0 refills | Status: DC

## 2022-01-04 MED ORDER — FLUCONAZOLE 150 MG PO TABS: ORAL_TABLET | ORAL | 0 refills | Status: DC

## 2022-01-04 MED ORDER — ALBUTEROL SULFATE HFA 108 (90 BASE) MCG/ACT IN AERS: 2.0000 | INHALATION_SPRAY | Freq: Four times a day (QID) | RESPIRATORY_TRACT | 0 refills | Status: DC | PRN

## 2022-01-04 MED ORDER — CEFDINIR 300 MG PO CAPS: 300.0000 mg | ORAL_CAPSULE | Freq: Two times a day (BID) | ORAL | 0 refills | Status: DC

## 2022-01-04 NOTE — Telephone Encounter (Signed)
Lm for pt to cb.

## 2022-01-04 NOTE — Progress Notes (Signed)
[name], female   DOB: 02-07-1951, 71 y.o.   MRN: [id number]   Virtual Visit via video Note   All issues noted in this document were discussed and addressed.  No physical exam was performed (except for noted visual exam findings with Video Visits).   I connected with Adrean Findlay by a video enabled telemedicine application and verified that I am speaking with the correct person using two identifiers. Location patient: home Location provider: work Persons participating in the virtual visit: patient, provider  The limitations, risks, security and privacy concerns of performing an evaluation and management service by video and the availability of in person appointments have been discussed.  It has also been discussed with the patient that there may be a patient responsible charge related to this service. The patient expressed understanding and agreed to proceed.   Reason for visit: work in appt  HPI: Work in with cough and congestion.  States symptoms started 12/31/21.  Reports sore throat and some discomfort in her ears (right > left).  Teeth ache.  Increased cough.  Sore that persist.  Nasal congestion.  Watery eyes.  Some increased pressure.  Clearing throat.  No chest pain or increased sob.  No vomiting or diarrhea.  Saline helps.  Taking dayquil and nyquil.  Covid test negative - this am.     ROS: See pertinent positives and negatives per HPI.  Past Medical History:  Diagnosis Date   Abdominal wall hernia    secondary to large left renal cyst removal   Arthritis    hands   Atrophic vaginitis    Car sickness    Carpal tunnel syndrome    Colon polyps    Complication of anesthesia    slow to wake   Diverticulosis    Fibrocystic breast disease    GERD (gastroesophageal reflux disease)    History of fatty infiltration of liver    Hyperlipidemia    Postmenopausal    Sinusitis     Past Surgical History:  Procedure Laterality Date   ABDOMINAL HYSTERECTOMY  2005    partial   APPENDECTOMY     BLEPHAROPLASTY     Bone Spur     removal - left index finger   CATARACT EXTRACTION, BILATERAL  2000, 2005   COLONOSCOPY WITH PROPOFOL N/A 10/26/2019   Procedure: COLONOSCOPY WITH PROPOFOL;  Surgeon: Lucilla Lame, MD;  Location: [place];  Service: Endoscopy;  Laterality: N/A;   ESOPHAGOGASTRODUODENOSCOPY (EGD) WITH PROPOFOL N/A 10/26/2019   Procedure: ESOPHAGOGASTRODUODENOSCOPY (EGD) WITH PROPOFOL and Dilation;  Surgeon: Lucilla Lame, MD;  Location: [place];  Service: Endoscopy;  Laterality: N/A;   EXCISION [facility]'S NEUROMA  [id number]   Dr. Albertine Patricia   HERNIA REPAIR     lumb hernia     RENAL CYST EXCISION     TUBAL LIGATION      Family History  Problem Relation Age of Onset   Cancer Father        prostate   Lung cancer Other        parent   Hypercholesterolemia Other        parent    SOCIAL HX: reviewed.    Current Outpatient Medications:    albuterol (VENTOLIN HFA) 108 (90 Base) MCG/ACT inhaler, Inhale 2 puffs into the lungs every 6 (six) hours as needed for wheezing or shortness of breath., Disp: 18 g, Rfl: 0   ALPRAZolam (XANAX) 0.25 MG tablet, TAKE 1 TABLET BY MOUTH DAILY AS NEEDED,  Disp: 30 tablet, Rfl: 0   cefdinir (OMNICEF) 300 MG capsule, Take 1 capsule (300 mg total) by mouth 2 (two) times daily., Disp: 20 capsule, Rfl: 0   esomeprazole (NEXIUM) 40 MG capsule, TAKE 1 CAPSULE (40 MG) BY MOUTH EVERY DAY, Disp: 30 capsule, Rfl: 3   fluconazole (DIFLUCAN) 150 MG tablet, Take t tablet x 1.  If persistent symptoms after three days may repeat x 1, Disp: 2 tablet, Rfl: 0   MILK THISTLE PO, Take by mouth 2 (two) times daily., Disp: , Rfl:    predniSONE (DELTASONE) 10 MG tablet, Take 6 tablets x 1 day and then decrease by 1/2 tablet per day until down to zero mg., Disp: 39 tablet, Rfl: 0   Probiotic Product (PROBIOTIC-10) CHEW, Chew by mouth daily., Disp: , Rfl:    sertraline (ZOLOFT) 50 MG tablet, TAKE 1 TABLET BY MOUTH  DAILY, Disp: 30 tablet, Rfl: 5   benzonatate (TESSALON) 100 MG capsule, Take 1 capsule (100 mg total) by mouth 3 (three) times daily., Disp: 21 capsule, Rfl: 0  EXAM:  GENERAL: alert, oriented, appears well and in no acute distress  HEENT: atraumatic, conjunttiva clear, no obvious abnormalities on inspection of external nose and ears  NECK: normal movements of the head and neck  LUNGS: on inspection no signs of respiratory distress, breathing rate appears normal, no obvious gross SOB, gasping or wheezing.  Increased cough with expiration of air.   CV: no obvious cyanosis  MS: moves all visible extremities without noticeable abnormality  PSYCH/NEURO: pleasant and cooperative, no obvious depression or anxiety, speech and thought processing grossly intact  ASSESSMENT AND PLAN:  Discussed the following assessment and plan:  Problem List Items Addressed This Visit     Cough - Primary    Increased cough and congestion as outlined.  No chest pain or increased sob. Covid test this am negative.  Discussed possible etiologies.  Increased sinus pressure, nasal congestion with increased cough as outlined.  Treat with saline nasal spray and steroid nasal spray as directed.  Robitussin DM.  Omnicef.  Prednisone taper as directed.  Follow closely.  Call with update.        Hyperglycemia    Low carb diet and exercise.  Follow met b and a1c. Prednisone can increase blood sugar.       Stress    Has been on zoloft.  Overall appears to be doing well.  Follow.        Return if symptoms worsen or fail to improve, for keep scheduled.   I discussed the assessment and treatment plan with the patient. The patient was provided an opportunity to ask questions and all were answered. The patient agreed with the plan and demonstrated an understanding of the instructions.   The patient was advised to call back or seek an in-person evaluation if the symptoms worsen or if the condition fails to improve as  anticipated.    Einar Pheasant, MD

## 2022-01-06 ENCOUNTER — Encounter: Payer: Self-pay | Admitting: Internal Medicine

## 2022-01-06 NOTE — Telephone Encounter (Signed)
Confirm taking prednisone, omnicef and using inhaler.  If improving, but just having persistent cough - ok to send in tessalon perles '100mg'$  tic prn cough #21 with no refills.  (Confirm no allergy)

## 2022-01-07 ENCOUNTER — Other Ambulatory Visit: Payer: Self-pay

## 2022-01-07 MED ORDER — BENZONATATE 100 MG PO CAPS: 100.0000 mg | ORAL_CAPSULE | Freq: Three times a day (TID) | ORAL | 0 refills | Status: DC

## 2022-01-07 NOTE — Telephone Encounter (Signed)
S/w pt - stated is using prednisone and omnicef. Has not used inhaler.  Has improved, but still having the cough.   Pt encouraged to use inhaler. Pt agreeable to tessalon.  Rx sent to pharmacy per pt request, marked for delivery.

## 2022-01-10 ENCOUNTER — Encounter: Payer: Self-pay | Admitting: Internal Medicine

## 2022-01-10 NOTE — Assessment & Plan Note (Signed)
Low carb diet and exercise.  Follow met b and a1c. Prednisone can increase blood sugar.

## 2022-01-10 NOTE — Assessment & Plan Note (Signed)
Increased cough and congestion as outlined.  No chest pain or increased sob. Covid test this am negative.  Discussed possible etiologies.  Increased sinus pressure, nasal congestion with increased cough as outlined.  Treat with saline nasal spray and steroid nasal spray as directed.  Robitussin DM.  Omnicef.  Prednisone taper as directed.  Follow closely.  Call with update.

## 2022-01-10 NOTE — Assessment & Plan Note (Signed)
Has been on zoloft.  Overall appears to be doing well.  Follow.

## 2022-01-11 NOTE — Telephone Encounter (Signed)
Patient stated she still has a nasal congestion and her ears ache and they pop when she swallows.. She is asking is there something else she needs to do to feel better?

## 2022-01-12 ENCOUNTER — Encounter: Payer: Self-pay | Admitting: Family Medicine

## 2022-01-12 ENCOUNTER — Ambulatory Visit: Payer: Medicare Other | Admitting: Family Medicine

## 2022-01-12 VITALS — BP 120/60 | HR 93 | Temp 97.7°F | Ht 61.0 in | Wt 153.8 lb

## 2022-01-12 DIAGNOSIS — E7800 Pure hypercholesterolemia, unspecified: Secondary | ICD-10-CM | POA: Diagnosis not present

## 2022-01-12 DIAGNOSIS — R739 Hyperglycemia, unspecified: Secondary | ICD-10-CM | POA: Diagnosis not present

## 2022-01-12 DIAGNOSIS — J069 Acute upper respiratory infection, unspecified: Secondary | ICD-10-CM | POA: Diagnosis not present

## 2022-01-12 MED ORDER — FEXOFENADINE HCL 180 MG PO TABS: 180.0000 mg | ORAL_TABLET | Freq: Every day | ORAL | 0 refills | Status: DC

## 2022-01-12 MED ORDER — FLUTICASONE PROPIONATE 50 MCG/ACT NA SUSP: 2.0000 | Freq: Every day | NASAL | 0 refills | Status: DC

## 2022-01-12 NOTE — Progress Notes (Signed)
SUBJECTIVE:   Chief Complaint  Patient presents with   Acute Visit    Congesiton/ ears popping/nasal congestion fatigue X 2 weeks   HPI Patient presents to clinic for nasal congestion, fatigue, loss of energy and ear popping for 2 weeks.  Home COVID testing was negative.  Endorses cough is now resolved.  Continues to have nasal congestion.   She reports her ears feel like they are popping when she is swallowing.  Husband has same symptoms earlier but has resolved. Has been taking antibiotics, prednisone and nasal saline spray.    PERTINENT PMH / PSH: Cough Stress Eosinophilia  OBJECTIVE:  BP 120/60   Pulse 93   Temp 97.7 F (36.5 C) (Oral)   Ht '5\' 1"'$  (1.549 m)   Wt 153 lb 12.8 oz (69.8 kg)   LMP 02/28/1990   SpO2 (!) 72%   BMI 29.06 kg/m    Physical Exam Vitals reviewed.  Constitutional:      General: She is not in acute distress.    Appearance: Normal appearance. She is not ill-appearing, toxic-appearing or diaphoretic.  HENT:     Right Ear: Tympanic membrane, ear canal and external ear normal. There is no impacted cerumen.     Left Ear: Tympanic membrane, ear canal and external ear normal. There is no impacted cerumen.     Nose: Nose normal. No congestion or rhinorrhea.     Mouth/Throat:     Mouth: Mucous membranes are moist.     Pharynx: Oropharynx is clear. No oropharyngeal exudate or posterior oropharyngeal erythema.  Eyes:     General:        Right eye: No discharge.        Left eye: No discharge.     Conjunctiva/sclera: Conjunctivae normal.  Cardiovascular:     Rate and Rhythm: Normal rate and regular rhythm.     Heart sounds: Normal heart sounds.  Pulmonary:     Effort: Pulmonary effort is normal.     Breath sounds: Normal breath sounds.  Musculoskeletal:        General: Normal range of motion.  Lymphadenopathy:     Cervical: No cervical adenopathy.  Skin:    General: Skin is warm and dry.  Neurological:     General: No focal deficit present.      Mental Status: She is alert and oriented to person, place, and time. Mental status is at baseline.  Psychiatric:        Mood and Affect: Mood normal.        Behavior: Behavior normal.        Thought Content: Thought content normal.        Judgment: Judgment normal.     ASSESSMENT/PLAN:  Upper respiratory tract infection, unspecified type Assessment & Plan: Suspect resolving URI given cough has resolved.  Currently on cefepime and steroid taper, almost completed. Continue antibiotics until course completed Continue steroids until course completed Will add nasal decongestant and antihistamine Start Flonase 2 sprays daily Start Allegra 1 tablet daily Strict return precautions provided  Orders: -     Fluticasone Propionate; Place 2 sprays into both nostrils daily.  Dispense: 16 g; Refill: 0 -     Fexofenadine HCl; Take 1 tablet (180 mg total) by mouth daily.  Dispense: 30 tablet; Refill: 0  Hypercholesterolemia -     Basic metabolic panel -     Hepatic function panel -     CBC with Differential/Platelet -     Lipid panel  Hyperglycemia -  Hemoglobin A1c   PDMP reviewed  Return if symptoms worsen or fail to improve, for PCP.  Carollee Leitz, MD

## 2022-01-12 NOTE — Patient Instructions (Addendum)
It was a pleasure meeting you today. Thank you for allowing me to take part in your health care.  Our goals for today as we discussed include:  Continue antibiotics until course completed Continue steroids until course completed Will add nasal decongestant and antihistamine Start Flonase 2 sprays daily Start Allegra 1 tablet daily If worsening symptoms of shortness of breath or increased fevers please notify MD or go to emergency department  If you have any questions or concerns, please do not hesitate to call the office at [phone].  I look forward to our next visit and until then take care and stay safe.  Regards,   Carollee Leitz, MD   [facility]

## 2022-01-14 ENCOUNTER — Other Ambulatory Visit: Payer: Self-pay

## 2022-01-14 ENCOUNTER — Telehealth: Payer: Self-pay | Admitting: Internal Medicine

## 2022-01-14 DIAGNOSIS — R739 Hyperglycemia, unspecified: Secondary | ICD-10-CM

## 2022-01-14 DIAGNOSIS — E7800 Pure hypercholesterolemia, unspecified: Secondary | ICD-10-CM

## 2022-01-14 DIAGNOSIS — R7989 Other specified abnormal findings of blood chemistry: Secondary | ICD-10-CM

## 2022-01-14 NOTE — Telephone Encounter (Signed)
Labs ordered.

## 2022-01-14 NOTE — Addendum Note (Signed)
Addended by: Alisa Graff on: 01/14/2022 09:39 PM   Modules accepted: Orders

## 2022-01-14 NOTE — Telephone Encounter (Signed)
Patient has a lab appt 01/19/2022, there are no orders in.

## 2022-01-19 ENCOUNTER — Other Ambulatory Visit (INDEPENDENT_AMBULATORY_CARE_PROVIDER_SITE_OTHER): Payer: Medicare Other

## 2022-01-19 ENCOUNTER — Other Ambulatory Visit: Payer: Medicare Other

## 2022-01-19 DIAGNOSIS — R7989 Other specified abnormal findings of blood chemistry: Secondary | ICD-10-CM

## 2022-01-19 DIAGNOSIS — R739 Hyperglycemia, unspecified: Secondary | ICD-10-CM | POA: Diagnosis not present

## 2022-01-19 DIAGNOSIS — E7800 Pure hypercholesterolemia, unspecified: Secondary | ICD-10-CM | POA: Diagnosis not present

## 2022-01-19 LAB — CBC WITH DIFFERENTIAL/PLATELET
Basophils Absolute: 0.1 10*3/uL (ref 0.0–0.1)
Basophils Relative: 1 % (ref 0.0–3.0)
Eosinophils Absolute: 0.2 10*3/uL (ref 0.0–0.7)
Eosinophils Relative: 2.4 % (ref 0.0–5.0)
HCT: 43.5 % (ref 36.0–46.0)
Hemoglobin: 14.6 g/dL (ref 12.0–15.0)
Lymphocytes Relative: 20.3 % (ref 12.0–46.0)
Lymphs Abs: 1.9 10*3/uL (ref 0.7–4.0)
MCHC: 33.6 g/dL (ref 30.0–36.0)
MCV: 98.9 fl (ref 78.0–100.0)
Monocytes Absolute: 0.8 10*3/uL (ref 0.1–1.0)
Monocytes Relative: 8.9 % (ref 3.0–12.0)
Neutro Abs: 6.4 10*3/uL (ref 1.4–7.7)
Neutrophils Relative %: 67.4 % (ref 43.0–77.0)
Platelets: 251 10*3/uL (ref 150.0–400.0)
RBC: 4.4 Mil/uL (ref 3.87–5.11)
RDW: 13.3 % (ref 11.5–15.5)
WBC: 9.5 10*3/uL (ref 4.0–10.5)

## 2022-01-19 LAB — HEPATIC FUNCTION PANEL
ALT: 53 U/L — ABNORMAL HIGH (ref 0–35)
AST: 29 U/L (ref 0–37)
Albumin: 4.3 g/dL (ref 3.5–5.2)
Alkaline Phosphatase: 60 U/L (ref 39–117)
Bilirubin, Direct: 0.1 mg/dL (ref 0.0–0.3)
Total Bilirubin: 0.9 mg/dL (ref 0.2–1.2)
Total Protein: 6.8 g/dL (ref 6.0–8.3)

## 2022-01-19 LAB — BASIC METABOLIC PANEL
BUN: 11 mg/dL (ref 6–23)
CO2: 25 mEq/L (ref 19–32)
Calcium: 9.6 mg/dL (ref 8.4–10.5)
Chloride: 102 mEq/L (ref 96–112)
Creatinine, Ser: 0.78 mg/dL (ref 0.40–1.20)
GFR: 77.01 mL/min (ref >60.00)
Glucose, Bld: 95 mg/dL (ref 70–99)
Potassium: 4.3 mEq/L (ref 3.5–5.1)
Sodium: 138 mEq/L (ref 135–145)

## 2022-01-19 LAB — HEMOGLOBIN A1C: Hgb A1c MFr Bld: 5.9 % (ref 4.6–6.5)

## 2022-01-19 LAB — LIPID PANEL
Cholesterol: 221 mg/dL — ABNORMAL HIGH (ref 0–200)
HDL: 61.1 mg/dL (ref >39.00)
NonHDL: 159.47
Total CHOL/HDL Ratio: 4
Triglycerides: 356 mg/dL — ABNORMAL HIGH (ref 0.0–149.0)
VLDL: 71.2 mg/dL — ABNORMAL HIGH (ref 0.0–40.0)

## 2022-01-19 LAB — LDL CHOLESTEROL, DIRECT: Direct LDL: 112 mg/dL

## 2022-01-19 LAB — TSH: TSH: 2.94 u[IU]/mL (ref 0.35–5.50)

## 2022-01-22 ENCOUNTER — Other Ambulatory Visit: Payer: Medicare Other

## 2022-01-24 ENCOUNTER — Encounter: Payer: Self-pay | Admitting: Family Medicine

## 2022-01-24 DIAGNOSIS — J069 Acute upper respiratory infection, unspecified: Secondary | ICD-10-CM | POA: Insufficient documentation

## 2022-01-24 NOTE — Assessment & Plan Note (Addendum)
Suspect resolving URI given cough has resolved.  Currently on cefepime and steroid taper, almost completed. Continue antibiotics until course completed Continue steroids until course completed Will add nasal decongestant and antihistamine Start Flonase 2 sprays daily Start Allegra 1 tablet daily Strict return precautions provided

## 2022-01-25 ENCOUNTER — Ambulatory Visit: Payer: Medicare Other | Admitting: Internal Medicine

## 2022-01-25 ENCOUNTER — Encounter: Payer: Self-pay | Admitting: Internal Medicine

## 2022-01-25 ENCOUNTER — Ambulatory Visit (INDEPENDENT_AMBULATORY_CARE_PROVIDER_SITE_OTHER): Payer: Medicare Other

## 2022-01-25 VITALS — BP 124/70 | HR 78 | Temp 98.0°F | Ht 61.0 in | Wt 155.0 lb

## 2022-01-25 DIAGNOSIS — N281 Cyst of kidney, acquired: Secondary | ICD-10-CM

## 2022-01-25 DIAGNOSIS — R739 Hyperglycemia, unspecified: Secondary | ICD-10-CM | POA: Diagnosis not present

## 2022-01-25 DIAGNOSIS — R490 Dysphonia: Secondary | ICD-10-CM

## 2022-01-25 DIAGNOSIS — E2839 Other primary ovarian failure: Secondary | ICD-10-CM | POA: Diagnosis not present

## 2022-01-25 DIAGNOSIS — Z1211 Encounter for screening for malignant neoplasm of colon: Secondary | ICD-10-CM

## 2022-01-25 DIAGNOSIS — Z0000 Encounter for general adult medical examination without abnormal findings: Secondary | ICD-10-CM | POA: Diagnosis not present

## 2022-01-25 DIAGNOSIS — E7800 Pure hypercholesterolemia, unspecified: Secondary | ICD-10-CM | POA: Diagnosis not present

## 2022-01-25 DIAGNOSIS — Z1231 Encounter for screening mammogram for malignant neoplasm of breast: Secondary | ICD-10-CM | POA: Diagnosis not present

## 2022-01-25 DIAGNOSIS — K219 Gastro-esophageal reflux disease without esophagitis: Secondary | ICD-10-CM

## 2022-01-25 DIAGNOSIS — R7989 Other specified abnormal findings of blood chemistry: Secondary | ICD-10-CM | POA: Diagnosis not present

## 2022-01-25 DIAGNOSIS — R0789 Other chest pain: Secondary | ICD-10-CM | POA: Diagnosis not present

## 2022-01-25 DIAGNOSIS — Z789 Other specified health status: Secondary | ICD-10-CM

## 2022-01-25 DIAGNOSIS — R072 Precordial pain: Secondary | ICD-10-CM | POA: Diagnosis not present

## 2022-01-25 MED ORDER — ESOMEPRAZOLE MAGNESIUM 40 MG PO CPDR: 40.0000 mg | DELAYED_RELEASE_CAPSULE | Freq: Two times a day (BID) | ORAL | 2 refills | Status: DC

## 2022-01-25 MED ORDER — ALPRAZOLAM 0.25 MG PO TABS: 0.2500 mg | ORAL_TABLET | Freq: Every day | ORAL | 0 refills | Status: DC | PRN

## 2022-01-25 MED ORDER — SERTRALINE HCL 50 MG PO TABS: 50.0000 mg | ORAL_TABLET | Freq: Every day | ORAL | 3 refills | Status: DC

## 2022-01-25 NOTE — Assessment & Plan Note (Signed)
Physical today 01/25/22.  Mammogram 02/24/21 - Birads I ([facility]).  Colonoscopy 10/26/19 - pathology - tubular adenoma.  Recommended f/u colonoscopy in 7 years.

## 2022-01-25 NOTE — Assessment & Plan Note (Addendum)
The 10-year ASCVD risk score (Arnett DK, et al., 2019) is: 9%   Values used to calculate the score:     Age: 71 years     Sex: Female     Is Non-Hispanic African American: No     Diabetic: No     Tobacco smoker: No     Systolic Blood Pressure: 411 mmHg     Is BP treated: No     HDL Cholesterol: 61.1 mg/dL     Total Cholesterol: 221 mg/dL  Have discussed calculated cholesterol risk and recommendation for statin.  Low cholesterol diet and exercise.  Follow lipid panel.

## 2022-01-25 NOTE — Progress Notes (Signed)
Subjective:    [name], female    DOB: 1950-08-25, 71 y.o.   MRN: [id number]  Patient here for  Chief Complaint  Patient presents with   Annual Exam    HPI Starting 12/31/21 - cough and congestion. Treated with omnicef, prednisone and saline/steroid nasal spray.  Reevaluated 01/12/22 - cough had resolved.  Continued nasal congestion. Nasal decongestant and antihistamine - added.  Reports persistent hoarseness.  Taking nexium.  Discussed bid dosing.  Increased belching.  Notices more symptoms - when bends over or with position changes.  Frequent loose stool.  Stomach bloated - at times.  No chest pain or sob reported.   Some pain - palpation - sternum.    Past Medical History:  Diagnosis Date   Abdominal wall hernia    secondary to large left renal cyst removal   Arthritis    hands   Atrophic vaginitis    Car sickness    Carpal tunnel syndrome    Colon polyps    Complication of anesthesia    slow to wake   Diverticulosis    Fibrocystic breast disease    GERD (gastroesophageal reflux disease)    History of fatty infiltration of liver    Hyperlipidemia    Postmenopausal    Sinusitis    Past Surgical History:  Procedure Laterality Date   ABDOMINAL HYSTERECTOMY  2005   partial   APPENDECTOMY     BLEPHAROPLASTY     Bone Spur     removal - left index finger   CATARACT EXTRACTION, BILATERAL  2000, 2005   COLONOSCOPY WITH PROPOFOL N/A 10/26/2019   Procedure: COLONOSCOPY WITH PROPOFOL;  Surgeon: Lucilla Lame, MD;  Location: [facility];  Service: Endoscopy;  Laterality: N/A;   ESOPHAGOGASTRODUODENOSCOPY (EGD) WITH PROPOFOL N/A 10/26/2019   Procedure: ESOPHAGOGASTRODUODENOSCOPY (EGD) WITH PROPOFOL and Dilation;  Surgeon: Lucilla Lame, MD;  Location: [facility];  Service: Endoscopy;  Laterality: N/A;   EXCISION [facility]  [id number]   Dr. Albertine Patricia   HERNIA REPAIR     lumb hernia     RENAL CYST EXCISION     TUBAL LIGATION      Family History  Problem Relation Age of Onset   Cancer Father        prostate   Lung cancer Other        parent   Hypercholesterolemia Other        parent   Social History   Socioeconomic History   Marital status: Married    Spouse name: Not on file   Number of children: 2   Years of education: Not on file   Highest education level: Not on file  Occupational History   Not on file  Tobacco Use   Smoking status: Former   Smokeless tobacco: Never  Vaping Use   Vaping Use: Not on file  Substance and Sexual Activity   Alcohol use: Yes    Alcohol/week: 5.0 - 6.0 standard drinks of alcohol    Types: 5 - 6 Glasses of wine per week   Drug use: No   Sexual activity: Not on file  Other Topics Concern   Not on file  Social History Narrative   Not on file   Social Determinants of Health   Financial Resource Strain: Low Risk  (11/07/2019)   Overall Financial Resource Strain (CARDIA)    Difficulty of Paying Living Expenses: Not hard at all  Food Insecurity: No Food Insecurity (11/07/2019)  Hunger Vital Sign    Worried About Running Out of Food in the Last Year: Never true    Ran Out of Food in the Last Year: Never true  Transportation Needs: No Transportation Needs (11/07/2019)   PRAPARE - Hydrologist (Medical): No    Lack of Transportation (Non-Medical): No  Physical Activity: Sufficiently Active (11/07/2019)   Exercise Vital Sign    Days of Exercise per Week: 5 days    Minutes of Exercise per Session: 30 min  Stress: No Stress Concern Present (11/07/2019)   [place]    Feeling of Stress : Not at all  Social Connections: Not on file     Review of Systems  Constitutional:  Negative for appetite change and unexpected weight change.  HENT:  Negative for congestion, sinus pressure and sore throat.   Eyes:  Negative for pain and visual disturbance.  Respiratory:  Negative for  cough, chest tightness and shortness of breath.   Cardiovascular:  Negative for chest pain, palpitations and leg swelling.       Palpation - over sternum.   Gastrointestinal:  Negative for abdominal pain, nausea and vomiting.       Intermittent loose stools.  Belching.    Genitourinary:  Negative for difficulty urinating and dysuria.  Musculoskeletal:  Negative for joint swelling and myalgias.  Skin:  Negative for color change and rash.  Neurological:  Negative for dizziness and headaches.  Hematological:  Negative for adenopathy. Does not bruise/bleed easily.  Psychiatric/Behavioral:  Negative for agitation and dysphoric mood.        Objective:     BP 124/70   Pulse 78   Temp 98 F (36.7 C)   Ht _0  (1.549 m)   Wt 155 lb (70.3 kg)   LMP 02/28/1990   SpO2 95%   BMI 29.29 kg/m  Wt Readings from Last 3 Encounters:  01/25/22 155 lb (70.3 kg)  01/12/22 153 lb 12.8 oz (69.8 kg)  01/04/22 155 lb (70.3 kg)    Physical Exam Vitals reviewed.  Constitutional:      General: She is not in acute distress.    Appearance: Normal appearance. She is well-developed.  HENT:     Head: Normocephalic and atraumatic.     Right Ear: External ear normal.     Left Ear: External ear normal.  Eyes:     General: No scleral icterus.       Right eye: No discharge.        Left eye: No discharge.     Conjunctiva/sclera: Conjunctivae normal.  Neck:     Thyroid: No thyromegaly.  Cardiovascular:     Rate and Rhythm: Normal rate and regular rhythm.  Pulmonary:     Effort: No tachypnea, accessory muscle usage or respiratory distress.     Breath sounds: Normal breath sounds. No decreased breath sounds or wheezing.  Chest:  Breasts:    Right: No inverted nipple, mass, nipple discharge or tenderness (no axillary adenopathy).     Left: No inverted nipple, mass, nipple discharge or tenderness (no axilarry adenopathy).  Abdominal:     General: Bowel sounds are normal.     Palpations: Abdomen is  soft.     Tenderness: There is no abdominal tenderness.  Musculoskeletal:        General: No swelling or tenderness.     Cervical back: Neck supple.  Lymphadenopathy:     Cervical: No cervical  adenopathy.  Skin:    Findings: No erythema or rash.  Neurological:     Mental Status: She is alert and oriented to person, place, and time.  Psychiatric:        Mood and Affect: Mood normal.        Behavior: Behavior normal.      Outpatient Encounter Medications as of 01/25/2022  Medication Sig   albuterol (VENTOLIN HFA) 108 (90 Base) MCG/ACT inhaler Inhale 2 puffs into the lungs every 6 (six) hours as needed for wheezing or shortness of breath.   benzonatate (TESSALON) 100 MG capsule Take 1 capsule (100 mg total) by mouth 3 (three) times daily.   cefdinir (OMNICEF) 300 MG capsule Take 1 capsule (300 mg total) by mouth 2 (two) times daily.   fexofenadine (ALLEGRA ALLERGY) 180 MG tablet Take 1 tablet (180 mg total) by mouth daily.   fluticasone (FLONASE) 50 MCG/ACT nasal spray Place 2 sprays into both nostrils daily.   MILK THISTLE PO Take by mouth 2 (two) times daily.   Probiotic Product (PROBIOTIC-10) CHEW Chew by mouth daily.   [DISCONTINUED] ALPRAZolam (XANAX) 0.25 MG tablet TAKE 1 TABLET BY MOUTH DAILY AS NEEDED   [DISCONTINUED] esomeprazole (NEXIUM) 40 MG capsule TAKE 1 CAPSULE (40 MG) BY MOUTH EVERY DAY   [DISCONTINUED] sertraline (ZOLOFT) 50 MG tablet TAKE 1 TABLET BY MOUTH DAILY   ALPRAZolam (XANAX) 0.25 MG tablet Take 1 tablet (0.25 mg total) by mouth daily as needed.   esomeprazole (NEXIUM) 40 MG capsule Take 1 capsule (40 mg total) by mouth 2 (two) times daily before a meal.   sertraline (ZOLOFT) 50 MG tablet Take 1 tablet (50 mg total) by mouth daily.   [DISCONTINUED] fluconazole (DIFLUCAN) 150 MG tablet Take t tablet x 1.  If persistent symptoms after three days may repeat x 1   [DISCONTINUED] predniSONE (DELTASONE) 10 MG tablet Take 6 tablets x 1 day and then decrease by 1/2  tablet per day until down to zero mg.   No facility-administered encounter medications on file as of 01/25/2022.     Lab Results  Component Value Date   WBC 9.5 01/19/2022   HGB 14.6 01/19/2022   HCT 43.5 01/19/2022   PLT 251.0 01/19/2022   GLUCOSE 95 01/19/2022   CHOL 221 (H) 01/19/2022   TRIG 356.0 (H) 01/19/2022   HDL 61.10 01/19/2022   LDLDIRECT 112.0 01/19/2022   LDLCALC 94 08/05/2020   ALT 53 (H) 01/19/2022   AST 29 01/19/2022   NA 138 01/19/2022   K 4.3 01/19/2022   CL 102 01/19/2022   CREATININE 0.78 01/19/2022   BUN 11 01/19/2022   CO2 25 01/19/2022   TSH 2.94 01/19/2022   HGBA1C 5.9 01/19/2022    Ultrasound renal complete  Result Date: 10/07/2021 CLINICAL DATA:  Renal cysts. EXAM: RENAL / URINARY TRACT ULTRASOUND COMPLETE COMPARISON:  CT abdomen and pelvis 07/16/2019 FINDINGS: Right Kidney: Renal measurements: 10.5 x 4.9 x 4.3 cm = volume: 116 mL. Echogenicity within normal limits. No hydronephrosis. There is a 1.6 x 1.3 x 0.9 cm cyst in the right kidney with thin peripheral calcification. Calcification is new from prior. Left Kidney: Renal measurements: 10.7 x 4.7 x 5.0 cm = volume: 131 mL. Echogenicity within normal limits. No hydronephrosis. Complex cystic lesion seen in the inferior pole the right kidney measuring 6.2 x 4.5 x 2.9 cm. This contains thin septations and peripheral calcifications as seen on prior CT. There is an additional 2.5 x 2.3 x 2.1 cm cyst  in the inferior pole of the kidney containing a single thin septation. There is an area of cortical scarring in the lateral kidney with adjacent cyst measuring 2.2 x 1.6 x 1.5 cm. There is a simple cyst in the superior pole measuring 2.0 x 2.0 x 1.8 cm. These all appear similar in size compared to the prior study. Bladder: Appears normal for degree of bladder distention. Other: None. IMPRESSION: 1. Bilateral renal cysts. Left renal cysts are mildly complex containing thin septations and calcifications. These are  likely benign. No follow-up necessary. If there is high clinical concern or risk recommend further evaluation with MRI. Electronically Signed   By: Ronney Asters M.D.   On: 10/07/2021 21:05       Assessment & Plan:  Routine general medical examination at a health care facility  Hypercholesterolemia Assessment & Plan: The 10-year ASCVD risk score (Arnett DK, et al., 2019) is: 9%   Values used to calculate the score:     Age: 81 years     Sex: Female     Is Non-Hispanic African American: No     Diabetic: No     Tobacco smoker: No     Systolic Blood Pressure: 132 mmHg     Is BP treated: No     HDL Cholesterol: 61.1 mg/dL     Total Cholesterol: 221 mg/dL  Have discussed calculated cholesterol risk and recommendation for statin.  Low cholesterol diet and exercise.  Follow lipid panel.    Orders: -     Basic metabolic panel; Future -     Lipid panel; Future  Hyperglycemia Assessment & Plan: Low carb diet and exercise.  Follow met b and a1c.   Orders: -     Hemoglobin A1c; Future  Elevated LFTs -     Hepatic function panel; Future  Encounter for screening mammogram for malignant neoplasm of breast -     3D Screening Mammogram, Left and Right; Future  Estrogen deficiency  Health care maintenance Assessment & Plan: Physical today 01/25/22.  Mammogram 02/24/21 - [facility] ([facility]).  Colonoscopy 10/26/19 - pathology - tubular adenoma.  Recommended f/u colonoscopy in 7 years.    Sternum pain Assessment & Plan: Persistent reproducible pain.  Check xray.  Further w/up / evaluation pending results.   Orders: -     DG Sternum; Future  Abnormal liver function test Assessment & Plan: Diet and exercise. Follow liver function tests.     Alcohol use ([place]) Assessment & Plan: Has decreased alcohol intake.  Follow.    Colon cancer screening Assessment & Plan: Colonoscopy 10/26/19 - pathology - tubular adenoma. Recommended f/u colonoscopy in 7 years.     Gastroesophageal reflux  disease, unspecified whether esophagitis present Assessment & Plan: Some belching.  Hoarseness.  Increase nexium to bid.  Follow.     Renal cyst Assessment & Plan: Dr Erlene Quan (10/2021) - Bilateral renal cyst of varying complexity, primarily Bosniak 1 and 2.  Recommend f/u renal ultrasound in 2 years.    Hoarseness Assessment & Plan: Recent cough and congestion.  Treated.  Cough resolved. Persistent hoarseness.  Also with symptoms - more noticeable with bending over and certain position changes.  Some bloating.  Increase nexium to bid as outlined.  Consider GI - possible EGD.    Other orders -     ALPRAZolam; Take 1 tablet (0.25 mg total) by mouth daily as needed.  Dispense: 30 tablet; Refill: 0 -     Esomeprazole Magnesium; Take 1 capsule (40  mg total) by mouth 2 (two) times daily before a meal.  Dispense: 60 capsule; Refill: 2 -     Sertraline HCl; Take 1 tablet (50 mg total) by mouth daily.  Dispense: 30 tablet; Refill: 3     Einar Pheasant, MD

## 2022-02-05 ENCOUNTER — Encounter: Payer: Self-pay | Admitting: Internal Medicine

## 2022-02-05 DIAGNOSIS — R490 Dysphonia: Secondary | ICD-10-CM | POA: Insufficient documentation

## 2022-02-05 NOTE — Assessment & Plan Note (Signed)
Persistent reproducible pain.  Check xray.  Further w/up / evaluation pending results.

## 2022-02-05 NOTE — Assessment & Plan Note (Signed)
Has decreased alcohol intake.  Follow.

## 2022-02-05 NOTE — Assessment & Plan Note (Signed)
Low carb diet and exercise.  Follow met b and a1c.  

## 2022-02-05 NOTE — Assessment & Plan Note (Signed)
Some belching.  Hoarseness.  Increase nexium to bid.  Follow.

## 2022-02-05 NOTE — Assessment & Plan Note (Addendum)
Dr Erlene Quan (10/2021) - Bilateral renal cyst of varying complexity, primarily Bosniak 1 and 2.  Recommend f/u renal ultrasound in 2 years.

## 2022-02-05 NOTE — Assessment & Plan Note (Signed)
Diet and exercise.  Follow liver function tests.   

## 2022-02-05 NOTE — Assessment & Plan Note (Signed)
Colonoscopy 10/26/19 - pathology - tubular adenoma. Recommended f/u colonoscopy in 7 years.

## 2022-02-05 NOTE — Assessment & Plan Note (Signed)
Recent cough and congestion.  Treated.  Cough resolved. Persistent hoarseness.  Also with symptoms - more noticeable with bending over and certain position changes.  Some bloating.  Increase nexium to bid as outlined.  Consider GI - possible EGD.

## 2022-02-09 ENCOUNTER — Encounter: Payer: Self-pay | Admitting: Internal Medicine

## 2022-02-15 ENCOUNTER — Other Ambulatory Visit: Payer: Self-pay

## 2022-02-15 DIAGNOSIS — R140 Abdominal distension (gaseous): Secondary | ICD-10-CM

## 2022-02-15 NOTE — Telephone Encounter (Signed)
[place] for referral to Dr Allen Norris - bowel change, bloating, etc.  Please notify pt once placed.

## 2022-03-02 DIAGNOSIS — Z1231 Encounter for screening mammogram for malignant neoplasm of breast: Secondary | ICD-10-CM | POA: Diagnosis not present

## 2022-03-02 LAB — HM MAMMOGRAPHY

## 2022-03-04 DIAGNOSIS — R928 Other abnormal and inconclusive findings on diagnostic imaging of breast: Secondary | ICD-10-CM | POA: Diagnosis not present

## 2022-03-04 LAB — HM MAMMOGRAPHY

## 2022-03-08 ENCOUNTER — Other Ambulatory Visit: Payer: Self-pay

## 2022-03-12 DIAGNOSIS — M170 Bilateral primary osteoarthritis of knee: Secondary | ICD-10-CM | POA: Diagnosis not present

## 2022-03-16 ENCOUNTER — Encounter: Payer: Self-pay | Admitting: Internal Medicine

## 2022-03-16 DIAGNOSIS — M170 Bilateral primary osteoarthritis of knee: Secondary | ICD-10-CM | POA: Insufficient documentation

## 2022-03-18 ENCOUNTER — Other Ambulatory Visit: Payer: Self-pay | Admitting: Family Medicine

## 2022-03-18 DIAGNOSIS — J069 Acute upper respiratory infection, unspecified: Secondary | ICD-10-CM

## 2022-03-18 MED ORDER — FEXOFENADINE HCL 180 MG PO TABS: 180.0000 mg | ORAL_TABLET | Freq: Every day | ORAL | 0 refills | Status: DC

## 2022-03-31 DIAGNOSIS — M170 Bilateral primary osteoarthritis of knee: Secondary | ICD-10-CM | POA: Diagnosis not present

## 2022-04-12 ENCOUNTER — Telehealth: Payer: Self-pay | Admitting: Family Medicine

## 2022-04-12 DIAGNOSIS — J069 Acute upper respiratory infection, unspecified: Secondary | ICD-10-CM

## 2022-04-12 MED ORDER — FEXOFENADINE HCL 180 MG PO TABS: 180.0000 mg | ORAL_TABLET | Freq: Every day | ORAL | 0 refills | Status: DC

## 2022-04-14 NOTE — Telephone Encounter (Signed)
Pt need a refill on allegra sent to [facility]

## 2022-04-14 NOTE — Telephone Encounter (Signed)
Medication was sent in on 04/12/22

## 2022-04-19 DIAGNOSIS — M25462 Effusion, left knee: Secondary | ICD-10-CM | POA: Diagnosis not present

## 2022-04-19 DIAGNOSIS — M1712 Unilateral primary osteoarthritis, left knee: Secondary | ICD-10-CM | POA: Diagnosis not present

## 2022-04-21 ENCOUNTER — Other Ambulatory Visit (INDEPENDENT_AMBULATORY_CARE_PROVIDER_SITE_OTHER): Payer: Medicare Other

## 2022-04-21 DIAGNOSIS — E7800 Pure hypercholesterolemia, unspecified: Secondary | ICD-10-CM | POA: Diagnosis not present

## 2022-04-21 DIAGNOSIS — R739 Hyperglycemia, unspecified: Secondary | ICD-10-CM

## 2022-04-21 DIAGNOSIS — R7989 Other specified abnormal findings of blood chemistry: Secondary | ICD-10-CM | POA: Diagnosis not present

## 2022-04-21 LAB — BASIC METABOLIC PANEL
BUN: 14 mg/dL (ref 6–23)
CO2: 25 mEq/L (ref 19–32)
Calcium: 9.9 mg/dL (ref 8.4–10.5)
Chloride: 104 mEq/L (ref 96–112)
Creatinine, Ser: 0.74 mg/dL (ref 0.40–1.20)
GFR: 81.89 mL/min (ref >60.00)
Glucose, Bld: 97 mg/dL (ref 70–99)
Potassium: 4.2 mEq/L (ref 3.5–5.1)
Sodium: 140 mEq/L (ref 135–145)

## 2022-04-21 LAB — LIPID PANEL
Cholesterol: 217 mg/dL — ABNORMAL HIGH (ref 0–200)
HDL: 63.7 mg/dL (ref >39.00)
NonHDL: 153.25
Total CHOL/HDL Ratio: 3
Triglycerides: 224 mg/dL — ABNORMAL HIGH (ref 0.0–149.0)
VLDL: 44.8 mg/dL — ABNORMAL HIGH (ref 0.0–40.0)

## 2022-04-21 LAB — HEPATIC FUNCTION PANEL
ALT: 38 U/L — ABNORMAL HIGH (ref 0–35)
AST: 29 U/L (ref 0–37)
Albumin: 4.5 g/dL (ref 3.5–5.2)
Alkaline Phosphatase: 69 U/L (ref 39–117)
Bilirubin, Direct: 0.1 mg/dL (ref 0.0–0.3)
Total Bilirubin: 0.6 mg/dL (ref 0.2–1.2)
Total Protein: 7 g/dL (ref 6.0–8.3)

## 2022-04-21 LAB — LDL CHOLESTEROL, DIRECT: Direct LDL: 132 mg/dL

## 2022-04-21 LAB — HEMOGLOBIN A1C: Hgb A1c MFr Bld: 5.7 % (ref 4.6–6.5)

## 2022-04-22 ENCOUNTER — Other Ambulatory Visit: Payer: Medicare Other

## 2022-04-26 ENCOUNTER — Ambulatory Visit: Payer: Medicare Other | Admitting: Internal Medicine

## 2022-04-28 ENCOUNTER — Encounter: Payer: Self-pay | Admitting: Nurse Practitioner

## 2022-04-28 ENCOUNTER — Ambulatory Visit (INDEPENDENT_AMBULATORY_CARE_PROVIDER_SITE_OTHER): Payer: Medicare Other | Admitting: Nurse Practitioner

## 2022-04-28 VITALS — BP 126/70 | HR 77 | Temp 98.1°F | Ht 61.0 in | Wt 154.2 lb

## 2022-04-28 DIAGNOSIS — J0100 Acute maxillary sinusitis, unspecified: Secondary | ICD-10-CM | POA: Insufficient documentation

## 2022-04-28 MED ORDER — AMOXICILLIN-POT CLAVULANATE 875-125 MG PO TABS: 1.0000 | ORAL_TABLET | Freq: Two times a day (BID) | ORAL | 0 refills | Status: DC

## 2022-04-28 MED ORDER — PSEUDOEPH-BROMPHEN-DM 30-2-10 MG/5ML PO SYRP: 5.0000 mL | ORAL_SOLUTION | Freq: Four times a day (QID) | ORAL | 0 refills | Status: DC | PRN

## 2022-04-28 NOTE — Progress Notes (Signed)
Tomasita Morrow, NP-C Phone: [phone]  Kara Burns is a 72 y.o. female who presents today for cough and congestion x 10 days. Reports bilateral ear fullness and popping.   Respiratory illness:  Cough- Yes  Congestion-    Sinus- Yes- nasal, pressure   Chest- Yes  Post nasal drip- Yes  Sore throat- Resolved  Shortness of breath- No  Fever- No  Fatigue/Myalgia- Yes Headache- No Nausea/Vomiting- No Taste disturbance- No  Smell disturbance- No  Covid exposure- No  Covid vaccination- x 5  Flu vaccination- Declined  Medications- Saline nasal spray, Allegra   Social History   Tobacco Use  Smoking Status Former  Smokeless Tobacco Never    Current Outpatient Medications on File Prior to Visit  Medication Sig Dispense Refill   albuterol (VENTOLIN HFA) 108 (90 Base) MCG/ACT inhaler Inhale 2 puffs into the lungs every 6 (six) hours as needed for wheezing or shortness of breath. 18 g 0   ALPRAZolam (XANAX) 0.25 MG tablet Take 1 tablet (0.25 mg total) by mouth daily as needed. 30 tablet 0   benzonatate (TESSALON) 100 MG capsule Take 1 capsule (100 mg total) by mouth 3 (three) times daily. 21 capsule 0   esomeprazole (NEXIUM) 40 MG capsule Take 1 capsule (40 mg total) by mouth 2 (two) times daily before a meal. 60 capsule 2   fexofenadine (ALLEGRA ALLERGY) 180 MG tablet Take 1 tablet (180 mg total) by mouth daily. 30 tablet 0   fluticasone (FLONASE) 50 MCG/ACT nasal spray Place 2 sprays into both nostrils daily. 16 g 0   MILK THISTLE PO Take by mouth 2 (two) times daily.     Probiotic Product (PROBIOTIC-10) CHEW Chew by mouth daily.     sertraline (ZOLOFT) 50 MG tablet Take 1 tablet (50 mg total) by mouth daily. 30 tablet 3   No current facility-administered medications on file prior to visit.     ROS see history of present illness  Objective  Physical Exam Vitals:   04/28/22 1451  BP: 126/70  Pulse: 77  Temp: 98.1 F (36.7 C)  SpO2: 97%    BP Readings from Last 3  Encounters:  04/28/22 126/70  01/25/22 124/70  01/12/22 120/60   Wt Readings from Last 3 Encounters:  04/28/22 154 lb 3.2 oz (69.9 kg)  01/25/22 155 lb (70.3 kg)  01/12/22 153 lb 12.8 oz (69.8 kg)    Physical Exam Constitutional:      General: She is not in acute distress.    Appearance: Normal appearance.  HENT:     Head: Normocephalic.     Right Ear: Tympanic membrane normal.     Left Ear: Tympanic membrane is bulging.     Nose: Congestion present.     Mouth/Throat:     Mouth: Mucous membranes are moist.     Pharynx: Oropharynx is clear.  Eyes:     Conjunctiva/sclera: Conjunctivae normal.     Pupils: Pupils are equal, round, and reactive to light.  Cardiovascular:     Rate and Rhythm: Normal rate and regular rhythm.     Heart sounds: Normal heart sounds.  Pulmonary:     Effort: Pulmonary effort is normal.     Breath sounds: Normal breath sounds.  Abdominal:     General: Abdomen is flat. Bowel sounds are normal.     Palpations: Abdomen is soft. There is no mass.     Tenderness: There is no abdominal tenderness.  Lymphadenopathy:     Cervical: No cervical adenopathy.  Skin:    General: Skin is warm and dry.  Neurological:     General: No focal deficit present.     Mental Status: She is alert.  Psychiatric:        Mood and Affect: Mood normal.        Behavior: Behavior normal.    Assessment/Plan: Please see individual problem list.  Acute non-recurrent maxillary sinusitis Assessment & Plan: Symptoms and duration consistent with sinusitis. Will treat with Augmentin 875 BID x 10 days and Bromfed DM for cough. Encouraged adequate fluid intake. She can continue her nasal spray daily. Advised to return if symptoms persist or do not improve.   Orders: -     Pseudoeph-Bromphen-DM; Take 5-10 mLs by mouth every 6 (six) hours as needed.  Dispense: 120 mL; Refill: 0 -     Amoxicillin-Pot Clavulanate; Take 1 tablet by mouth 2 (two) times daily.  Dispense: 20 tablet; Refill:  0   Return if symptoms worsen or fail to improve.   Tomasita Morrow, NP-C [place]

## 2022-04-28 NOTE — Assessment & Plan Note (Addendum)
Symptoms and duration consistent with sinusitis. Will treat with Augmentin 875 BID x 10 days and Bromfed DM for cough. Encouraged adequate fluid intake. She can continue her nasal spray daily. Return precautions given to patient.

## 2022-05-05 ENCOUNTER — Encounter: Payer: Self-pay | Admitting: Gastroenterology

## 2022-05-05 ENCOUNTER — Telehealth (INDEPENDENT_AMBULATORY_CARE_PROVIDER_SITE_OTHER): Payer: Medicare Other | Admitting: Gastroenterology

## 2022-05-05 VITALS — Wt 154.0 lb

## 2022-05-05 DIAGNOSIS — R197 Diarrhea, unspecified: Secondary | ICD-10-CM

## 2022-05-05 DIAGNOSIS — R140 Abdominal distension (gaseous): Secondary | ICD-10-CM | POA: Diagnosis not present

## 2022-05-05 DIAGNOSIS — K219 Gastro-esophageal reflux disease without esophagitis: Secondary | ICD-10-CM

## 2022-05-05 MED ORDER — PANTOPRAZOLE SODIUM 40 MG PO TBEC: 40.0000 mg | DELAYED_RELEASE_TABLET | Freq: Every day | ORAL | 11 refills | Status: DC

## 2022-05-05 NOTE — Progress Notes (Signed)
Kara Lame, MD [address]  [place]  [place], [place] [address]  Main: [phone]  Fax: [phone]    Gastroenterology Virtual/Video Visit  Referring Provider:     Einar Pheasant, MD Primary Care Physician:  Einar Pheasant, MD Primary Gastroenterologist:  Dr.Bettejane Leavens Allen Norris Reason for Consultation:     GERD, diarrhea and bloating        HPI:    Virtual Visit via Video Note Location of the patient: Home Location of provider: Home Participating persons: The patient and myself.  I connected with Kara Burns on 05/05/22 at  8:15 AM EDT by a video enabled telemedicine application and verified that I am speaking with the correct person using two identifiers.   I discussed the limitations of evaluation and management by telemedicine and the availability of in person appointments. The patient expressed understanding and agreed to proceed.  Verbal consent to proceed obtained.  History of Present Illness: Kara Burns is a 72 y.o. female referred by Dr. Einar Pheasant, MD  for consultation & management of bloating.  This patient comes in after seeing me in the past for a hiatal hernia and GERD.  The patient has a history of colon polyps with a tubular adenoma at her last colonoscopy in 2021.  The patient had contacted her primary care physician in January for referral to me due to a change in bowel habits with more frequent stools and bloating.  The patient's liver enzymes have also fluctuated with her labs trending showing:  Component     Latest Ref Rng 02/23/2021 01/19/2022 04/21/2022  Bilirubin, Direct     0.0 - 0.3 mg/dL 0.1  0.1  0.1   Alkaline Phosphatase     39 - 117 U/L 54  60  69   AST     0 - 37 U/L 20  29  29    ALT     0 - 35 U/L 24  53 (H)  38 (H)    The patient reports that her diarrhea has woken her up from a sound sleep.  She also has a lot of bloating.  She denies taking excessive amounts of dairy products and has not noticed any food causing her any  worsening of her symptoms.  She states that she has been on Nexium for some time and it does not appear to be working for her.  The patient denies any black stools or bloody stools and reports that she has lost 2 to 3 pounds but she has changed her eating habits recently.  Past Medical History:  Diagnosis Date   Abdominal wall hernia    secondary to large left renal cyst removal   Arthritis    hands   Atrophic vaginitis    Car sickness    Carpal tunnel syndrome    Colon polyps    Complication of anesthesia    slow to wake   Diverticulosis    Fibrocystic breast disease    GERD (gastroesophageal reflux disease)    History of fatty infiltration of liver    Hyperlipidemia    Postmenopausal    Sinusitis     Past Surgical History:  Procedure Laterality Date   ABDOMINAL HYSTERECTOMY  2005   partial   APPENDECTOMY     BLEPHAROPLASTY     Bone Spur     removal - left index finger   CATARACT EXTRACTION, BILATERAL  2000, 2005   COLONOSCOPY WITH PROPOFOL N/A 10/26/2019   Procedure: COLONOSCOPY WITH PROPOFOL;  Surgeon: Kara Lame, MD;  Location: [facility];  Service: Endoscopy;  Laterality: N/A;   ESOPHAGOGASTRODUODENOSCOPY (EGD) WITH PROPOFOL N/A 10/26/2019   Procedure: ESOPHAGOGASTRODUODENOSCOPY (EGD) WITH PROPOFOL and Dilation;  Surgeon: Kara Lame, MD;  Location: [facility];  Service: Endoscopy;  Laterality: N/A;   EXCISION [facility]'S NEUROMA  [id number]   Dr. Albertine Patricia   HERNIA REPAIR     lumb hernia     RENAL CYST EXCISION     TUBAL LIGATION      Prior to Admission medications   Medication Sig Start Date End Date Taking? Authorizing Provider  albuterol (VENTOLIN HFA) 108 (90 Base) MCG/ACT inhaler Inhale 2 puffs into the lungs every 6 (six) hours as needed for wheezing or shortness of breath. 01/04/22   Einar Pheasant, MD  ALPRAZolam Duanne Moron) 0.25 MG tablet Take 1 tablet (0.25 mg total) by mouth daily as needed. 01/25/22   Einar Pheasant, MD   amoxicillin-clavulanate (AUGMENTIN) 875-125 MG tablet Take 1 tablet by mouth 2 (two) times daily. 04/28/22   Tomasita Morrow, NP  benzonatate (TESSALON) 100 MG capsule Take 1 capsule (100 mg total) by mouth 3 (three) times daily. 01/07/22   Einar Pheasant, MD  brompheniramine-pseudoephedrine-DM 30-2-10 MG/5ML syrup Take 5-10 mLs by mouth every 6 (six) hours as needed. 04/28/22   Tomasita Morrow, NP  esomeprazole (NEXIUM) 40 MG capsule Take 1 capsule (40 mg total) by mouth 2 (two) times daily before a meal. 01/25/22   Einar Pheasant, MD  fexofenadine [facility] ALLERGY) 180 MG tablet Take 1 tablet (180 mg total) by mouth daily. 04/12/22   Carollee Leitz, MD  fluticasone (FLONASE) 50 MCG/ACT nasal spray Place 2 sprays into both nostrils daily. 01/12/22   Carollee Leitz, MD  MILK THISTLE PO Take by mouth 2 (two) times daily.    [provider]  Probiotic Product (PROBIOTIC-10) CHEW Chew by mouth daily.    [provider]  sertraline (ZOLOFT) 50 MG tablet Take 1 tablet (50 mg total) by mouth daily. 01/25/22   Einar Pheasant, MD    Family History  Problem Relation Age of Onset   Cancer Father        prostate   Lung cancer Other        parent   Hypercholesterolemia Other        parent     Social History   Tobacco Use   Smoking status: Former   Smokeless tobacco: Never  Substance Use Topics   Alcohol use: Yes    Alcohol/week: 5.0 - 6.0 standard drinks of alcohol    Types: 5 - 6 Glasses of wine per week   Drug use: No    Allergies as of 05/05/2022 - Review Complete 04/28/2022  Allergen Reaction Noted   Erythromycin Diarrhea 02/29/2012   Erythromycin ethylsuccinate  02/29/2012    Review of Systems:    All systems reviewed and negative except where noted in HPI.   Observations/Objective:  Labs: CBC    Component Value Date/Time   WBC 9.5 01/19/2022 0937   RBC 4.40 01/19/2022 0937   HGB 14.6 01/19/2022 0937   HCT 43.5 01/19/2022 0937   PLT 251.0 01/19/2022 0937   MCV  98.9 01/19/2022 0937   MCHC 33.6 01/19/2022 0937   RDW 13.3 01/19/2022 0937   LYMPHSABS 1.9 01/19/2022 0937   MONOABS 0.8 01/19/2022 0937   EOSABS 0.2 01/19/2022 0937   BASOSABS 0.1 01/19/2022 0937   CMP     Component Value Date/Time   NA 140 04/21/2022 0953  NA 139 07/21/2011 0000   K 4.2 04/21/2022 0953   CL 104 04/21/2022 0953   CO2 25 04/21/2022 0953   GLUCOSE 97 04/21/2022 0953   BUN 14 04/21/2022 0953   CREATININE 0.74 04/21/2022 0953   CALCIUM 9.9 04/21/2022 0953   PROT 7.0 04/21/2022 0953   PROT 6.8 04/20/2019 1514   ALBUMIN 4.5 04/21/2022 0953   ALBUMIN 4.4 04/20/2019 1514   AST 29 04/21/2022 0953   ALT 38 (H) 04/21/2022 0953   ALKPHOS 69 04/21/2022 0953   BILITOT 0.6 04/21/2022 0953   BILITOT 0.3 04/20/2019 1514    Imaging Studies: No results found.  Assessment and Plan:   Kara Burns is a 72 y.o. y/o female has been referred for GERD bloating and diarrhea.  The patient has been told that her diarrhea and GERD may be due to the Nexium causing the diarrhea and no longer working.  The patient has been told that we will switch her to pantoprazole.  If the pantoprazole takes care of the reflux but she continues to have the diarrhea she may need a repeat colonoscopy to rule out microscopic colitis.  The patient has also been told to avoid any dairy products or sugar substitutes that can exacerbate diarrhea.  She has also been told that if the diarrhea resolves but she continues to have GERD she may need an upper endoscopy.  The patient will try the medication and then contact me through [facility] and let me know how her symptoms are progressing.  The patient has been explained the plan agrees with it.  Follow Up Instructions:  I discussed the assessment and treatment plan with the patient. The patient was provided an opportunity to ask questions and all were answered. The patient agreed with the plan and demonstrated an understanding of the instructions.   The patient  was advised to call back or seek an in-person evaluation if the symptoms worsen or if the condition fails to improve as anticipated.  I provided 35 minutes of non-face-to-face time during this encounter including chart review In preparation for the encounter.   Kara Lame, MD  Speech recognition software was used to dictate the above note.

## 2022-05-13 ENCOUNTER — Telehealth: Payer: Medicare Other | Admitting: Internal Medicine

## 2022-05-13 ENCOUNTER — Encounter: Payer: Self-pay | Admitting: Internal Medicine

## 2022-05-13 VITALS — Ht 61.0 in | Wt 154.0 lb

## 2022-05-13 DIAGNOSIS — Z1211 Encounter for screening for malignant neoplasm of colon: Secondary | ICD-10-CM

## 2022-05-13 DIAGNOSIS — R7989 Other specified abnormal findings of blood chemistry: Secondary | ICD-10-CM

## 2022-05-13 DIAGNOSIS — K219 Gastro-esophageal reflux disease without esophagitis: Secondary | ICD-10-CM

## 2022-05-13 DIAGNOSIS — Z789 Other specified health status: Secondary | ICD-10-CM

## 2022-05-13 DIAGNOSIS — J069 Acute upper respiratory infection, unspecified: Secondary | ICD-10-CM

## 2022-05-13 DIAGNOSIS — F439 Reaction to severe stress, unspecified: Secondary | ICD-10-CM

## 2022-05-13 DIAGNOSIS — M170 Bilateral primary osteoarthritis of knee: Secondary | ICD-10-CM

## 2022-05-13 DIAGNOSIS — N281 Cyst of kidney, acquired: Secondary | ICD-10-CM

## 2022-05-13 DIAGNOSIS — E7800 Pure hypercholesterolemia, unspecified: Secondary | ICD-10-CM

## 2022-05-13 DIAGNOSIS — R0789 Other chest pain: Secondary | ICD-10-CM

## 2022-05-13 DIAGNOSIS — R739 Hyperglycemia, unspecified: Secondary | ICD-10-CM

## 2022-05-13 MED ORDER — FEXOFENADINE HCL 180 MG PO TABS: 180.0000 mg | ORAL_TABLET | Freq: Every day | ORAL | 1 refills | Status: DC

## 2022-05-13 NOTE — Progress Notes (Signed)
[name], female   DOB: 04/07/1950, 72 y.o.   MRN: [id number]   Virtual Visit via video Note  All issues noted in this document were discussed and addressed.  No physical exam was performed (except for noted visual exam findings with Video Visits).   I connected with Kara Burns by a video enabled telemedicine application and verified that I am speaking with the correct person using two identifiers. Location patient: home Location provider: work  Persons participating in the virtual visit: patient, provider  The limitations, risks, security and privacy concerns of performing an evaluation and management service by video and the availability of in person appointments have been discussed.  It has also been discussed with the patient that there may be a patient responsible charge related to this service. The patient expressed understanding and agreed to proceed.   Reason for visit: follow up appt  HPI: Follow up regarding hypercholesterolemia, GERD, hyperglycemia and abnormal liver function tests.  She report she is doing relatively well. Did see Kara Burns 05/05/22 - continued bloating and diarrhea.  He changed her nexium to protonix.  Reports still having belching. Diarrhea not as bad.  Will give this change a little longer and see if symptoms improve.  Continue f/u with GI.  Eating.  No chest pain reported.  Breathing stable.  No sternum pain.  Saw ortho - knee pain.  Evaluated 04/19/22 - Kara Burns.  Recommended aspiration and injection.  Knee is some better.  Continue to follow up with ortho.     ROS: See pertinent positives and negatives per HPI.  Past Medical History:  Diagnosis Date   Abdominal wall hernia    secondary to large left renal cyst removal   Arthritis    hands   Atrophic vaginitis    Car sickness    Carpal tunnel syndrome    Colon polyps    Complication of anesthesia    slow to wake   Diverticulosis    Fibrocystic breast disease    GERD  (gastroesophageal reflux disease)    History of fatty infiltration of liver    Hyperlipidemia    Postmenopausal    Sinusitis     Past Surgical History:  Procedure Laterality Date   ABDOMINAL HYSTERECTOMY  2005   partial   APPENDECTOMY     BLEPHAROPLASTY     Bone Spur     removal - left index finger   CATARACT EXTRACTION, BILATERAL  2000, 2005   COLONOSCOPY WITH PROPOFOL N/A 10/26/2019   Procedure: COLONOSCOPY WITH PROPOFOL;  Surgeon: Midge Minium, MD;  Location: [facility];  Service: Endoscopy;  Laterality: N/A;   ESOPHAGOGASTRODUODENOSCOPY (EGD) WITH PROPOFOL N/A 10/26/2019   Procedure: ESOPHAGOGASTRODUODENOSCOPY (EGD) WITH PROPOFOL and Dilation;  Surgeon: Midge Minium, MD;  Location: [facility];  Service: Endoscopy;  Laterality: N/A;   EXCISION [facility]'S NEUROMA  [id number]   Kara Burns   HERNIA REPAIR     lumb hernia     RENAL CYST EXCISION     TUBAL LIGATION      Family History  Problem Relation Age of Onset   Cancer Father        prostate   Lung cancer Other        parent   Hypercholesterolemia Other        parent    SOCIAL HX: reviewed.    Current Outpatient Medications:    albuterol (VENTOLIN HFA) 108 (90 Base) MCG/ACT inhaler, Inhale 2 puffs into the lungs  every 6 (six) hours as needed for wheezing or shortness of breath., Disp: 18 g, Rfl: 0   ALPRAZolam (XANAX) 0.25 MG tablet, Take 1 tablet (0.25 mg total) by mouth daily as needed., Disp: 30 tablet, Rfl: 0   fexofenadine (ALLEGRA ALLERGY) 180 MG tablet, Take 1 tablet (180 mg total) by mouth daily., Disp: 90 tablet, Rfl: 1   fluticasone (FLONASE) 50 MCG/ACT nasal spray, Place 2 sprays into both nostrils daily., Disp: 16 g, Rfl: 0   MILK THISTLE PO, Take by mouth 2 (two) times daily., Disp: , Rfl:    pantoprazole (PROTONIX) 40 MG tablet, Take 1 tablet (40 mg total) by mouth daily., Disp: 30 tablet, Rfl: 11   Probiotic Product (PROBIOTIC-10) CHEW, Chew by mouth daily., Disp: , Rfl:     sertraline (ZOLOFT) 50 MG tablet, Take 1 tablet (50 mg total) by mouth daily., Disp: 30 tablet, Rfl: 3  EXAM:  GENERAL: alert, oriented, appears well and in no acute distress  HEENT: atraumatic, conjunttiva clear, no obvious abnormalities on inspection of external nose and ears  NECK: normal movements of the head and neck  LUNGS: on inspection no signs of respiratory distress, breathing rate appears normal, no obvious gross SOB, gasping or wheezing  CV: no obvious cyanosis  PSYCH/NEURO: pleasant and cooperative, no obvious depression or anxiety, speech and thought processing grossly intact  ASSESSMENT AND PLAN:  Discussed the following assessment and plan:  Problem List Items Addressed This Visit     URI (upper respiratory infection)   Relevant Medications   fexofenadine (ALLEGRA ALLERGY) 180 MG tablet   Stress    Has been on zoloft.  Overall appears to be doing well.  Follow.       Sternum pain    Resolved.        Renal cyst    Kara Burns (10/2021) - Bilateral renal cyst of varying complexity, primarily Bosniak 1 and 2.  Recommend f/u renal ultrasound in 2 years.       Osteoarthritis of knees, bilateral    Saw ortho - knee pain.  Evaluated 04/19/22 - Kara Burns.  Recommended aspiration and injection.  Knee is some better.  Continue to follow up with ortho.       Hyperglycemia    Low carb diet and exercise.  Follow met b and a1c.       Relevant Orders   Hemoglobin A1c   Hypercholesterolemia - Primary    The 10-year ASCVD risk score (Arnett DK, et al., 2019) is: 9.2%   Values used to calculate the score:     Age: 82 years     Sex: Female     Is Non-Hispanic African American: No     Diabetic: No     Tobacco smoker: No     Systolic Blood Pressure: 126 mmHg     Is BP treated: No     HDL Cholesterol: 63.7 mg/dL     Total Cholesterol: 217 mg/dL  Discussed diet and exercise.  Calculated cholesterol risk recommends to start cholesterol medication.  Follow lipid  panel.       Relevant Orders   Basic metabolic panel   Lipid panel   Hepatic function panel   GERD (gastroesophageal reflux disease)    Some belching and symptoms as outlined.  Saw Kara Burns.  Changed to protonix.   Follow.        Colon cancer screening    Colonoscopy 10/26/19 - pathology - tubular adenoma. Recommended f/u colonoscopy in 7 years.  Alcohol use (HCC)    Discussed - need to decrease alcohol intake.       Abnormal liver function test    Diet and exercise. Follow liver function tests.  Discussed the need to stop/decrease alcohol intake.        Return in about 4 months (around 09/12/2022) for follow-up with fasting labs 2 days prior.   I discussed the assessment and treatment plan with the patient. The patient was provided an opportunity to ask questions and all were answered. The patient agreed with the plan and demonstrated an understanding of the instructions.   The patient was advised to call back or seek an in-person evaluation if the symptoms worsen or if the condition fails to improve as anticipated.    Dale Artesia, MD

## 2022-05-13 NOTE — Assessment & Plan Note (Addendum)
The 10-year ASCVD risk score (Arnett DK, et al., 2019) is: 9.2%   Values used to calculate the score:     Age: [age] years     Sex: Female     Is Non-Hispanic African American: No     Diabetic: No     Tobacco smoker: No     Systolic Blood Pressure: 126 mmHg     Is BP treated: No     HDL Cholesterol: 63.7 mg/dL     Total Cholesterol: 217 mg/dL  Discussed diet and exercise.  Calculated cholesterol risk recommends to start cholesterol medication.  Follow lipid panel.

## 2022-05-16 ENCOUNTER — Encounter: Payer: Self-pay | Admitting: Internal Medicine

## 2022-05-16 NOTE — Assessment & Plan Note (Signed)
Some belching and symptoms as outlined.  Saw Dr Servando Snare.  Changed to protonix.   Follow.

## 2022-05-16 NOTE — Assessment & Plan Note (Signed)
Discussed - need to decrease alcohol intake.

## 2022-05-16 NOTE — Assessment & Plan Note (Signed)
Diet and exercise. Follow liver function tests.  Discussed the need to stop/decrease alcohol intake.

## 2022-05-16 NOTE — Assessment & Plan Note (Signed)
Colonoscopy 10/26/19 - pathology - tubular adenoma. Recommended f/u colonoscopy in 7 years.   

## 2022-05-16 NOTE — Assessment & Plan Note (Signed)
Low carb diet and exercise.  Follow met b and a1c.   

## 2022-05-16 NOTE — Assessment & Plan Note (Signed)
Has been on zoloft.  Overall appears to be doing well.  Follow.  

## 2022-05-16 NOTE — Assessment & Plan Note (Signed)
Dr Brandon (10/2021) - Bilateral renal cyst of varying complexity, primarily Bosniak 1 and 2.  Recommend f/u renal ultrasound in 2 years.  

## 2022-05-16 NOTE — Assessment & Plan Note (Signed)
Resolved

## 2022-05-16 NOTE — Assessment & Plan Note (Signed)
Saw ortho - knee pain.  Evaluated 04/19/22 - Dr Fay Records.  Recommended aspiration and injection.  Knee is some better.  Continue to follow up with ortho.

## 2022-05-17 ENCOUNTER — Telehealth: Payer: Self-pay | Admitting: Gastroenterology

## 2022-05-17 NOTE — Telephone Encounter (Signed)
Pt had video visit on 05/05/2022 started protonix symptoms have not changed would like a call back

## 2022-05-21 ENCOUNTER — Other Ambulatory Visit: Payer: Self-pay

## 2022-05-21 ENCOUNTER — Encounter: Payer: Self-pay | Admitting: Gastroenterology

## 2022-05-21 DIAGNOSIS — K219 Gastro-esophageal reflux disease without esophagitis: Secondary | ICD-10-CM

## 2022-05-21 DIAGNOSIS — R197 Diarrhea, unspecified: Secondary | ICD-10-CM

## 2022-05-21 MED ORDER — NA SULFATE-K SULFATE-MG SULF 17.5-3.13-1.6 GM/177ML PO SOLN: 1.0000 | Freq: Once | ORAL | 0 refills | Status: AC

## 2022-05-21 NOTE — Telephone Encounter (Signed)
Pt reports no improvement with Sx... colonoscopy and Bravo pH scheduled

## 2022-06-15 ENCOUNTER — Encounter
Admission: RE | Disposition: A | Discharge status: Home / Self Care | Payer: Self-pay | Source: Home / Self Care | Attending: Gastroenterology

## 2022-06-15 ENCOUNTER — Ambulatory Visit: Payer: Medicare Other | Admitting: Certified Registered"

## 2022-06-15 ENCOUNTER — Ambulatory Visit
Admission: RE | Admit: 2022-06-15 | Discharge: 2022-06-15 | Disposition: A | Discharge status: Home / Self Care | Payer: Medicare Other | Attending: Gastroenterology | Admitting: Gastroenterology

## 2022-06-15 ENCOUNTER — Encounter: Payer: Self-pay | Admitting: Gastroenterology

## 2022-06-15 DIAGNOSIS — K5730 Diverticulosis of large intestine without perforation or abscess without bleeding: Secondary | ICD-10-CM | POA: Diagnosis not present

## 2022-06-15 DIAGNOSIS — K219 Gastro-esophageal reflux disease without esophagitis: Secondary | ICD-10-CM | POA: Diagnosis not present

## 2022-06-15 DIAGNOSIS — Z87891 Personal history of nicotine dependence: Secondary | ICD-10-CM | POA: Insufficient documentation

## 2022-06-15 DIAGNOSIS — K760 Fatty (change of) liver, not elsewhere classified: Secondary | ICD-10-CM | POA: Diagnosis not present

## 2022-06-15 DIAGNOSIS — N281 Cyst of kidney, acquired: Secondary | ICD-10-CM | POA: Insufficient documentation

## 2022-06-15 DIAGNOSIS — K635 Polyp of colon: Secondary | ICD-10-CM | POA: Diagnosis not present

## 2022-06-15 DIAGNOSIS — R1310 Dysphagia, unspecified: Secondary | ICD-10-CM | POA: Diagnosis not present

## 2022-06-15 DIAGNOSIS — R197 Diarrhea, unspecified: Secondary | ICD-10-CM | POA: Diagnosis not present

## 2022-06-15 DIAGNOSIS — E785 Hyperlipidemia, unspecified: Secondary | ICD-10-CM | POA: Diagnosis not present

## 2022-06-15 DIAGNOSIS — K529 Noninfective gastroenteritis and colitis, unspecified: Secondary | ICD-10-CM | POA: Insufficient documentation

## 2022-06-15 DIAGNOSIS — K222 Esophageal obstruction: Secondary | ICD-10-CM | POA: Diagnosis not present

## 2022-06-15 DIAGNOSIS — D124 Benign neoplasm of descending colon: Secondary | ICD-10-CM | POA: Insufficient documentation

## 2022-06-15 DIAGNOSIS — K449 Diaphragmatic hernia without obstruction or gangrene: Secondary | ICD-10-CM | POA: Diagnosis not present

## 2022-06-15 HISTORY — DX: Other specified postprocedural states: Z98.890

## 2022-06-15 HISTORY — PX: ESOPHAGOGASTRODUODENOSCOPY (EGD) WITH PROPOFOL: SHX5813

## 2022-06-15 HISTORY — PX: COLONOSCOPY WITH PROPOFOL: SHX5780

## 2022-06-15 HISTORY — DX: Nausea with vomiting, unspecified: R11.2

## 2022-06-15 HISTORY — PX: BRAVO PH STUDY: SHX5421

## 2022-06-15 SURGERY — COLONOSCOPY WITH PROPOFOL
Anesthesia: General

## 2022-06-15 MED ORDER — LIDOCAINE HCL (CARDIAC) PF 100 MG/5ML IV SOSY
PREFILLED_SYRINGE | INTRAVENOUS | Status: DC | PRN
  Administered 2022-06-15: 100 mg via INTRAVENOUS

## 2022-06-15 MED ORDER — PROPOFOL 10 MG/ML IV BOLUS
INTRAVENOUS | Status: AC
  Filled 2022-06-15: qty 20

## 2022-06-15 MED ORDER — GLYCOPYRROLATE 0.2 MG/ML IJ SOLN
INTRAMUSCULAR | Status: DC | PRN
  Administered 2022-06-15: .1 mg via INTRAVENOUS

## 2022-06-15 MED ORDER — PROPOFOL 10 MG/ML IV BOLUS
INTRAVENOUS | Status: DC | PRN
  Administered 2022-06-15: 120 mg via INTRAVENOUS
  Administered 2022-06-15 (×2): 30 mg via INTRAVENOUS
  Administered 2022-06-15 (×3): 20 mg via INTRAVENOUS
  Administered 2022-06-15: 150 ug/kg/min via INTRAVENOUS
  Administered 2022-06-15: 40 mg via INTRAVENOUS

## 2022-06-15 MED ORDER — SODIUM CHLORIDE 0.9 % IV SOLN: INTRAVENOUS | Status: DC

## 2022-06-15 NOTE — Op Note (Signed)
[facility] Gastroenterology Patient Name: Kara Burns Procedure Date: 06/15/2022 9:25 AM MRN: [id number] Account #: [id number] Date of Birth: [birth year] Admit Type: Outpatient Age: 72 Room: [facility] ENDO ROOM 4 Gender: Female Note Status: Finalized Instrument Name: Upper Endoscope [id number] Procedure:             Upper GI endoscopy Indications:           Dysphagia, Heartburn Providers:             Midge Minium MD, MD Referring MD:          Dale [place], MD (Referring MD) Medicines:             Propofol per Anesthesia Complications:         No immediate complications. Procedure:             Pre-Anesthesia Assessment:                        - Prior to the procedure, a History and Physical was                         performed, and patient medications and allergies were                         reviewed. The patient's tolerance of previous                         anesthesia was also reviewed. The risks and benefits                         of the procedure and the sedation options and risks                         were discussed with the patient. All questions were                         answered, and informed consent was obtained. Prior                         Anticoagulants: The patient has taken no anticoagulant                         or antiplatelet agents. ASA Grade Assessment: II - A                         patient with mild systemic disease. After reviewing                         the risks and benefits, the patient was deemed in                         satisfactory condition to undergo the procedure.                        After obtaining informed consent, the endoscope was                         passed under direct vision. Throughout the procedure,  the patient's blood pressure, pulse, and oxygen                         saturations were monitored continuously. The                         Endosonoscope was introduced through the mouth, and                          advanced to the second part of duodenum. The upper GI                         endoscopy was accomplished without difficulty. The                         patient tolerated the procedure well. Findings:      Two benign-appearing, intrinsic mild stenoses were found in the lower       third of the esophagus and UES. The stenoses were traversed. A TTS       dilator was passed through the scope. Dilation with a 15-16.5-18 mm       balloon dilator was performed to 18 mm. The dilation site was examined       following endoscope reinsertion and showed complete resolution of       luminal narrowing. The BRAVO capsule with delivery system was introduced       through the mouth and advanced into the esophagus, such that the BRAVO       pH capsule was positioned 25 cm from the incisors, which was 6 cm       proximal to the GE junction. The BRAVO pH capsule was then deployed and       attached to the esophageal mucosa. The delivery system was then       withdrawn. Endoscopy was utilized for placement of the probe only.      The stomach was normal.      The examined duodenum was normal. Impression:            - Benign-appearing esophageal stenoses. Dilated.                        - Normal stomach.                        - Normal examined duodenum.                        - The BRAVO pH capsule was deployed.                        - No specimens collected. Recommendation:        - Discharge patient to home.                        - Resume previous diet.                        - Perform a colonoscopy today. Procedure Code(s):     --- Professional ---                        [phone], Esophagogastroduodenoscopy, flexible,  transoral; with transendoscopic balloon dilation of                         esophagus (less than 30 mm diameter) Diagnosis Code(s):     --- Professional ---                        R13.10, Dysphagia, unspecified                        K22.2,  Esophageal obstruction CPT copyright 2022 American Medical Association. All rights reserved. The codes documented in this report are preliminary and upon coder review may  be revised to meet current compliance requirements. Midge Minium MD, MD 06/15/2022 9:44:46 AM This report has been signed electronically. Number of Addenda: 0 Note Initiated On: 06/15/2022 9:25 AM Estimated Blood Loss:  Estimated blood loss: none.      [facility]

## 2022-06-15 NOTE — Anesthesia Preprocedure Evaluation (Signed)
Anesthesia Evaluation  Patient identified by MRN, date of birth, ID band Patient awake    Reviewed: Allergy & Precautions, H&P , NPO status , Patient's Chart, lab work & pertinent test results, reviewed documented beta blocker date and time   History of Anesthesia Complications (+) PONV and history of anesthetic complications  Airway Mallampati: III  TM Distance: >3 FB Neck ROM: full    Dental  (+) Dental Advidsory Given, Caps, Teeth Intact   Pulmonary neg pulmonary ROS, former smoker   Pulmonary exam normal breath sounds clear to auscultation       Cardiovascular Exercise Tolerance: Good negative cardio ROS Normal cardiovascular exam Rhythm:regular Rate:Normal     Neuro/Psych negative neurological ROS  negative psych ROS   GI/Hepatic Neg liver ROS,GERD  ,,  Endo/Other  negative endocrine ROS    Renal/GU Renal disease (renal cysts)  negative genitourinary   Musculoskeletal   Abdominal   Peds  Hematology negative hematology ROS (+)   Anesthesia Other Findings Past Medical History: No date: Abdominal wall hernia     Comment:  secondary to large left renal cyst removal No date: Arthritis     Comment:  hands No date: Atrophic vaginitis No date: Car sickness No date: Carpal tunnel syndrome No date: Colon polyps No date: Complication of anesthesia     Comment:  slow to wake No date: Diverticulosis No date: Fibrocystic breast disease No date: GERD (gastroesophageal reflux disease) No date: History of fatty infiltration of liver No date: Hyperlipidemia No date: PONV (postoperative nausea and vomiting) No date: Postmenopausal No date: Sinusitis   Reproductive/Obstetrics negative OB ROS                             Anesthesia Physical Anesthesia Plan  ASA: 2  Anesthesia Plan: General   Post-op Pain Management:    Induction: Intravenous  PONV Risk Score and Plan: 4 or greater  and Propofol infusion and TIVA  Airway Management Planned: Natural Airway and Nasal Cannula  Additional Equipment:   Intra-op Plan:   Post-operative Plan:   Informed Consent: I have reviewed the patients History and Physical, chart, labs and discussed the procedure including the risks, benefits and alternatives for the proposed anesthesia with the patient or authorized representative who has indicated his/her understanding and acceptance.     Dental Advisory Given  Plan Discussed with: Anesthesiologist, CRNA and Surgeon  Anesthesia Plan Comments:        Anesthesia Quick Evaluation

## 2022-06-15 NOTE — H&P (Signed)
Midge Minium, MD [facility] [address] [place], [place] [address] Phone:[phone] Fax : [phone]  Primary Care Physician:  Dale Lamar, MD Primary Gastroenterologist:  Dr. Servando Snare  Pre-Procedure History & Physical: HPI:  Kara Burns is a 72 y.o. female is here for an endoscopy and colonoscopy.   Past Medical History:  Diagnosis Date   Abdominal wall hernia    secondary to large left renal cyst removal   Arthritis    hands   Atrophic vaginitis    Car sickness    Carpal tunnel syndrome    Colon polyps    Complication of anesthesia    slow to wake   Diverticulosis    Fibrocystic breast disease    GERD (gastroesophageal reflux disease)    History of fatty infiltration of liver    Hyperlipidemia    PONV (postoperative nausea and vomiting)    Postmenopausal    Sinusitis     Past Surgical History:  Procedure Laterality Date   ABDOMINAL HYSTERECTOMY  2005   partial   APPENDECTOMY     BLEPHAROPLASTY     Bone Spur     removal - left index finger   CATARACT EXTRACTION, BILATERAL  2000, 2005   COLONOSCOPY WITH PROPOFOL N/A 10/26/2019   Procedure: COLONOSCOPY WITH PROPOFOL;  Surgeon: Midge Minium, MD;  Location: [facility];  Service: Endoscopy;  Laterality: N/A;   ESOPHAGOGASTRODUODENOSCOPY (EGD) WITH PROPOFOL N/A 10/26/2019   Procedure: ESOPHAGOGASTRODUODENOSCOPY (EGD) WITH PROPOFOL and Dilation;  Surgeon: Midge Minium, MD;  Location: [facility];  Service: Endoscopy;  Laterality: N/A;   EXCISION [facility]  [id number]   Dr. Recardo Evangelist   HERNIA REPAIR     lumb hernia     RENAL CYST EXCISION     TUBAL LIGATION      Prior to Admission medications   Medication Sig Start Date End Date Taking? Authorizing Provider  fexofenadine (ALLEGRA ALLERGY) 180 MG tablet Take 1 tablet (180 mg total) by mouth daily. 05/13/22  Yes Dale [place], MD  fluticasone (FLONASE) 50 MCG/ACT nasal spray Place 2 sprays into both nostrils daily. 01/12/22  Yes  Dana Allan, MD  MILK THISTLE PO Take by mouth 2 (two) times daily.   Yes [provider]  pantoprazole (PROTONIX) 40 MG tablet Take 1 tablet (40 mg total) by mouth daily. 05/05/22  Yes Midge Minium, MD  sertraline (ZOLOFT) 50 MG tablet Take 1 tablet (50 mg total) by mouth daily. 01/25/22  Yes Dale Bolivar, MD  albuterol (VENTOLIN HFA) 108 (90 Base) MCG/ACT inhaler Inhale 2 puffs into the lungs every 6 (six) hours as needed for wheezing or shortness of breath. 01/04/22   Dale [place], MD  ALPRAZolam [name] Feeler) 0.25 MG tablet Take 1 tablet (0.25 mg total) by mouth daily as needed. 01/25/22   Dale , MD  Probiotic Product (PROBIOTIC-10) CHEW Chew by mouth daily.    [provider]    Allergies as of 05/21/2022 - Review Complete 05/16/2022  Allergen Reaction Noted   Erythromycin Diarrhea 02/29/2012   Erythromycin ethylsuccinate  02/29/2012    Family History  Problem Relation Age of Onset   Cancer Father        prostate   Lung cancer Other        parent   Hypercholesterolemia Other        parent    Social History   Socioeconomic History   Marital status: Married    Spouse name: Not on file   Number of  children: 2   Years of education: Not on file   Highest education level: 12th grade  Occupational History   Not on file  Tobacco Use   Smoking status: Former   Smokeless tobacco: Never  Vaping Use   Vaping Use: Not on file  Substance and Sexual Activity   Alcohol use: Yes    Alcohol/week: 5.0 - 6.0 standard drinks of alcohol    Types: 5 - 6 Glasses of wine per week   Drug use: No   Sexual activity: Not on file  Other Topics Concern   Not on file  Social History Narrative   Not on file   Social Determinants of Health   Financial Resource Strain: Low Risk  (04/27/2022)   Overall Financial Resource Strain (CARDIA)    Difficulty of Paying Living Expenses: Not hard at all  Food Insecurity: No Food Insecurity (04/27/2022)   Hunger Vital Sign     Worried About Running Out of Food in the Last Year: Never true    Ran Out of Food in the Last Year: Never true  Transportation Needs: No Transportation Needs (04/27/2022)   PRAPARE - Administrator, Civil Service (Medical): No    Lack of Transportation (Non-Medical): No  Physical Activity: Sufficiently Active (04/27/2022)   Exercise Vital Sign    Days of Exercise per Week: 3 days    Minutes of Exercise per Session: 50 min  Stress: No Stress Concern Present (04/27/2022)   [employer] of Occupational Health - Occupational Stress Questionnaire    Feeling of Stress : Not at all  Social Connections: Socially Integrated (04/27/2022)   Social Connection and Isolation Panel [NHANES]    Frequency of Communication with Friends and Family: More than three times a week    Frequency of Social Gatherings with Friends and Family: Three times a week    Attends Religious Services: More than 4 times per year    Active Member of Clubs or Organizations: Yes    Attends Banker Meetings: 1 to 4 times per year    Marital Status: Married  Catering manager Violence: Not At Risk (11/07/2019)   Humiliation, Afraid, Rape, and Kick questionnaire    Fear of Current or Ex-Partner: No    Emotionally Abused: No    Physically Abused: No    Sexually Abused: No    Review of Systems: See HPI, otherwise negative ROS  Physical Exam: BP (!) 140/69   Pulse 68   Temp 97.7 F (36.5 C) (Temporal)   Resp 16   Ht 5\' 1"  (1.549 m)   Wt 69.6 kg   LMP 02/28/1990   SpO2 97%   BMI 28.98 kg/m  General:   Alert,  pleasant and cooperative in NAD Head:  Normocephalic and atraumatic. Neck:  Supple; no masses or thyromegaly. Lungs:  Clear throughout to auscultation.    Heart:  Regular rate and rhythm. Abdomen:  Soft, nontender and nondistended. Normal bowel sounds, without guarding, and without rebound.   Neurologic:  Alert and  oriented x4;  grossly normal  neurologically.  Impression/Plan: Kara Burns is here for an endoscopy and colonoscopy to be performed for diarrhea and dysphagia  Risks, benefits, limitations, and alternatives regarding  endoscopy and colonoscopy have been reviewed with the patient.  Questions have been answered.  All parties agreeable.   Midge Minium, MD  06/15/2022, 8:56 AM

## 2022-06-15 NOTE — Op Note (Signed)
[facility] Patient Name: Kara Burns Procedure Date: 06/15/2022 9:24 AM MRN: [id number] Account #: [id number] Date of Birth: 27-Mar-1950 Admit Type: Outpatient Age: 72 Room: [facility] ENDO ROOM 4 Gender: Female Note Status: Finalized Instrument Name: Prentice Docker [id number] Procedure:             Colonoscopy Indications:           Chronic diarrhea Providers:             Midge Minium MD, MD Referring MD:          Dale [place], MD (Referring MD) Medicines:             Propofol per Anesthesia Complications:         No immediate complications. Procedure:             Pre-Anesthesia Assessment:                        - Prior to the procedure, a History and Physical was                         performed, and patient medications and allergies were                         reviewed. The patient's tolerance of previous                         anesthesia was also reviewed. The risks and benefits                         of the procedure and the sedation options and risks                         were discussed with the patient. All questions were                         answered, and informed consent was obtained. Prior                         Anticoagulants: The patient has taken no anticoagulant                         or antiplatelet agents. ASA Grade Assessment: II - A                         patient with mild systemic disease. After reviewing                         the risks and benefits, the patient was deemed in                         satisfactory condition to undergo the procedure.                        After obtaining informed consent, the colonoscope was                         passed under direct vision. Throughout the procedure,  the patient's blood pressure, pulse, and oxygen                         saturations were monitored continuously. The                         Colonoscope was introduced through the anus and                          advanced to the the cecum, identified by appendiceal                         orifice and ileocecal valve. The colonoscopy was                         performed without difficulty. The patient tolerated                         the procedure well. The quality of the bowel                         preparation was excellent. Findings:      The perianal and digital rectal examinations were normal.      A 3 mm polyp was found in the descending colon. The polyp was sessile.       The polyp was removed with a cold biopsy forceps. Resection and       retrieval were complete.      Multiple small-mouthed diverticula were found in the sigmoid colon and       descending colon.      Random biopsies were obtained with cold forceps for histology randomly       in the entire colon. Impression:            - One 3 mm polyp in the descending colon, removed with                         a cold biopsy forceps. Resected and retrieved.                        - Diverticulosis in the sigmoid colon and in the                         descending colon.                        - Random biopsies were obtained in the entire colon. Recommendation:        - Discharge patient to home.                        - Resume previous diet.                        - Continue present medications.                        - Await pathology results.                        - If the pathology report reveals adenomatous tissue,  then repeat the colonoscopy for surveillance in 7                         years. Procedure Code(s):     --- Professional ---                        [phone], Colonoscopy, flexible; with biopsy, single or                         multiple Diagnosis Code(s):     --- Professional ---                        K52.9, Noninfective gastroenteritis and colitis,                         unspecified                        D12.4, Benign neoplasm of descending colon CPT copyright 2022 American Medical  Association. All rights reserved. The codes documented in this report are preliminary and upon coder review may  be revised to meet current compliance requirements. Midge Minium MD, MD 06/15/2022 9:58:48 AM This report has been signed electronically. Number of Addenda: 0 Note Initiated On: 06/15/2022 9:24 AM Scope Withdrawal Time: 0 hours 6 minutes 22 seconds  Total Procedure Duration: 0 hours 9 minutes 53 seconds  Estimated Blood Loss:  Estimated blood loss: none.      [facility]

## 2022-06-15 NOTE — Transfer of Care (Signed)
Immediate Anesthesia Transfer of Care Note  Patient: Kara Burns  Procedure(s) Performed: COLONOSCOPY WITH PROPOFOL BRAVO PH STUDY ESOPHAGOGASTRODUODENOSCOPY (EGD) WITH PROPOFOL  Patient Location: Endoscopy Unit  Anesthesia Type:General  Level of Consciousness: drowsy  Airway & Oxygen Therapy: Patient Spontanous Breathing  Post-op Assessment: Report given to RN and Post -op Vital signs reviewed and stable  Post vital signs: Reviewed and stable  Last Vitals:  Vitals Value Taken Time  BP 102/68 06/15/22 1000  Temp 36 C 06/15/22 1000  Pulse 84 06/15/22 1000  Resp 12 06/15/22 1000  SpO2 93 % 06/15/22 1000    Last Pain:  Vitals:   06/15/22 1000  TempSrc: Temporal  PainSc: Asleep         Complications: No notable events documented.

## 2022-06-16 ENCOUNTER — Encounter: Payer: Self-pay | Admitting: Gastroenterology

## 2022-06-16 LAB — SURGICAL PATHOLOGY

## 2022-06-19 ENCOUNTER — Encounter: Payer: Self-pay | Admitting: Gastroenterology

## 2022-06-19 DIAGNOSIS — T18108A Unspecified foreign body in esophagus causing other injury, initial encounter: Secondary | ICD-10-CM

## 2022-06-19 NOTE — Anesthesia Postprocedure Evaluation (Signed)
Anesthesia Post Note  Patient: Kara Burns  Procedure(s) Performed: COLONOSCOPY WITH PROPOFOL BRAVO PH STUDY ESOPHAGOGASTRODUODENOSCOPY (EGD) WITH PROPOFOL  Patient location during evaluation: Endoscopy Anesthesia Type: General Level of consciousness: awake and alert Pain management: pain level controlled Vital Signs Assessment: post-procedure vital signs reviewed and stable Respiratory status: spontaneous breathing, nonlabored ventilation, respiratory function stable and patient connected to nasal cannula oxygen Cardiovascular status: blood pressure returned to baseline and stable Postop Assessment: no apparent nausea or vomiting Anesthetic complications: no   No notable events documented.   Last Vitals:  Vitals:   06/15/22 1010 06/15/22 1020  BP: 132/75 (!) 146/68  Pulse: 84 78  Resp: 19 13  Temp:    SpO2: 93%     Last Pain:  Vitals:   06/15/22 1020  TempSrc:   PainSc: 0-No pain                 Lenard Simmer

## 2022-06-21 NOTE — Addendum Note (Signed)
Addended by: Roena Malady on: 06/21/2022 09:28 AM   Modules accepted: Orders

## 2022-07-13 ENCOUNTER — Other Ambulatory Visit: Payer: Self-pay | Admitting: Internal Medicine

## 2022-07-14 NOTE — Telephone Encounter (Signed)
Duplicate request

## 2022-07-15 ENCOUNTER — Other Ambulatory Visit: Payer: Self-pay

## 2022-07-15 DIAGNOSIS — H02883 Meibomian gland dysfunction of right eye, unspecified eyelid: Secondary | ICD-10-CM | POA: Diagnosis not present

## 2022-07-15 DIAGNOSIS — H43392 Other vitreous opacities, left eye: Secondary | ICD-10-CM | POA: Diagnosis not present

## 2022-07-15 DIAGNOSIS — Z961 Presence of intraocular lens: Secondary | ICD-10-CM | POA: Diagnosis not present

## 2022-07-15 DIAGNOSIS — H04123 Dry eye syndrome of bilateral lacrimal glands: Secondary | ICD-10-CM | POA: Diagnosis not present

## 2022-07-15 MED ORDER — SERTRALINE HCL 50 MG PO TABS: 50.0000 mg | ORAL_TABLET | Freq: Every day | ORAL | 3 refills | Status: DC

## 2022-07-15 NOTE — Telephone Encounter (Signed)
I have sent in her zoloft. Please refuse these refills.

## 2022-07-26 DIAGNOSIS — M1712 Unilateral primary osteoarthritis, left knee: Secondary | ICD-10-CM | POA: Diagnosis not present

## 2022-07-26 DIAGNOSIS — M25462 Effusion, left knee: Secondary | ICD-10-CM | POA: Diagnosis not present

## 2022-07-27 ENCOUNTER — Encounter: Payer: Self-pay | Admitting: Gastroenterology

## 2022-07-27 ENCOUNTER — Ambulatory Visit: Payer: Medicare Other | Admitting: Gastroenterology

## 2022-07-27 VITALS — BP 124/75 | HR 80 | Temp 98.2°F | Wt 154.0 lb

## 2022-07-27 DIAGNOSIS — K219 Gastro-esophageal reflux disease without esophagitis: Secondary | ICD-10-CM | POA: Diagnosis not present

## 2022-07-27 NOTE — Progress Notes (Signed)
Primary Care Physician: Dale Rolette, MD  Primary Gastroenterologist:  Dr. Midge Minium  Chief Complaint  Patient presents with   Follow-up    Bravo pH results    HPI: Kara Burns is a 72 y.o. female here for follow-up after having a Bravo pH study.  The patient had mild reflux that was associated with burping and heartburn.  The patient went back on the PPI and states that she has no more heartburn but she continues to burp.  She states that most of burping is in the morning.  The patient denies any dysphagia at the present time.  She had a esophageal stricture that was dilated at time of the endoscopy.  Past Medical History:  Diagnosis Date   Abdominal wall hernia    secondary to large left renal cyst removal   Arthritis    hands   Atrophic vaginitis    Car sickness    Carpal tunnel syndrome    Colon polyps    Complication of anesthesia    slow to wake   Diverticulosis    Fibrocystic breast disease    GERD (gastroesophageal reflux disease)    History of fatty infiltration of liver    Hyperlipidemia    PONV (postoperative nausea and vomiting)    Postmenopausal    Sinusitis     Current Outpatient Medications  Medication Sig Dispense Refill   albuterol (VENTOLIN HFA) 108 (90 Base) MCG/ACT inhaler Inhale 2 puffs into the lungs every 6 (six) hours as needed for wheezing or shortness of breath. 18 g 0   ALPRAZolam (XANAX) 0.25 MG tablet Take 1 tablet (0.25 mg total) by mouth daily as needed. 30 tablet 0   fexofenadine (ALLEGRA ALLERGY) 180 MG tablet Take 1 tablet (180 mg total) by mouth daily. 90 tablet 1   fluticasone (FLONASE) 50 MCG/ACT nasal spray Place 2 sprays into both nostrils daily. 16 g 0   MILK THISTLE PO Take by mouth 2 (two) times daily.     pantoprazole (PROTONIX) 40 MG tablet Take 1 tablet (40 mg total) by mouth daily. 30 tablet 11   Probiotic Product (PROBIOTIC-10) CHEW Chew by mouth daily.     sertraline (ZOLOFT) 50 MG tablet Take 1 tablet (50 mg  total) by mouth daily. 30 tablet 3   No current facility-administered medications for this visit.    Allergies as of 07/27/2022 - Review Complete 07/27/2022  Allergen Reaction Noted   Erythromycin Diarrhea 02/29/2012   Erythromycin ethylsuccinate  02/29/2012    ROS:  General: Negative for anorexia, weight loss, fever, chills, fatigue, weakness. ENT: Negative for hoarseness, difficulty swallowing , nasal congestion. CV: Negative for chest pain, angina, palpitations, dyspnea on exertion, peripheral edema.  Respiratory: Negative for dyspnea at rest, dyspnea on exertion, cough, sputum, wheezing.  GI: See history of present illness. GU:  Negative for dysuria, hematuria, urinary incontinence, urinary frequency, nocturnal urination.  Endo: Negative for unusual weight change.    Physical Examination:   BP 124/75 (BP Location: Left Arm, Patient Position: Sitting, Cuff Size: Normal)   Pulse 80   Temp 98.2 F (36.8 C) (Oral)   Wt 154 lb (69.9 kg)   LMP 02/28/1990   BMI 29.10 kg/m   General: Well-nourished, well-developed in no acute distress.  Eyes: No icterus. Conjunctivae pink. Neuro: Alert and oriented x 3.  Grossly intact. Skin: Warm and dry, no jaundice.   Psych: Alert and cooperative, normal mood and affect.  Labs:    Imaging Studies: No results  found.  Assessment and Plan:   Kara Burns is a 73 y.o. y/o female who is here for follow-up after having a Bravo pH study done.  The patient's Bravo pH off medication showed her to have a DeMeester score of 16.3 which is slightly elevated over the normal of 14.72.  The patient states that she has very little acid reflux since she has gone back on the Protonix.  Her main issue is a lot of belching that she states is mostly in the morning when she wakes up.  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  She has also been offered antireflux surgery to avoid being on the Protonix and her regurgitation.  The patient  states that she would like to continue on the medication and not pursue any further modalities unless the symptoms should get worse.  The patient was explained the risks of a PPI.  She states she understands and agrees with taking it.     Midge Minium, MD. Clementeen Graham    Note: This dictation was prepared with Dragon dictation along with smaller phrase technology. Any transcriptional errors that result from this process are unintentional.

## 2022-08-02 DIAGNOSIS — M1712 Unilateral primary osteoarthritis, left knee: Secondary | ICD-10-CM | POA: Diagnosis not present

## 2022-08-02 DIAGNOSIS — M25462 Effusion, left knee: Secondary | ICD-10-CM | POA: Diagnosis not present

## 2022-08-03 DIAGNOSIS — L298 Other pruritus: Secondary | ICD-10-CM | POA: Diagnosis not present

## 2022-08-03 DIAGNOSIS — L821 Other seborrheic keratosis: Secondary | ICD-10-CM | POA: Diagnosis not present

## 2022-08-03 DIAGNOSIS — L570 Actinic keratosis: Secondary | ICD-10-CM | POA: Diagnosis not present

## 2022-10-25 ENCOUNTER — Other Ambulatory Visit: Payer: Self-pay | Admitting: Internal Medicine

## 2022-10-25 DIAGNOSIS — M1712 Unilateral primary osteoarthritis, left knee: Secondary | ICD-10-CM | POA: Diagnosis not present

## 2022-11-07 ENCOUNTER — Other Ambulatory Visit: Payer: Self-pay | Admitting: Internal Medicine

## 2022-11-08 MED ORDER — ALPRAZOLAM 0.25 MG PO TABS: 0.2500 mg | ORAL_TABLET | Freq: Every day | ORAL | 0 refills | Status: DC | PRN

## 2022-11-08 NOTE — Telephone Encounter (Signed)
Rx ok'd for alprazolam.  Needs a f/u appt scheduled.

## 2022-11-11 NOTE — Telephone Encounter (Signed)
Patient called back and scheduled an appt for 11/22/2022.

## 2022-11-22 ENCOUNTER — Ambulatory Visit (INDEPENDENT_AMBULATORY_CARE_PROVIDER_SITE_OTHER): Payer: Medicare Other | Admitting: Internal Medicine

## 2022-11-22 VITALS — BP 118/68 | HR 80 | Temp 98.2°F | Resp 16 | Ht 61.0 in | Wt 149.0 lb

## 2022-11-22 DIAGNOSIS — M25569 Pain in unspecified knee: Secondary | ICD-10-CM | POA: Diagnosis not present

## 2022-11-22 DIAGNOSIS — R7989 Other specified abnormal findings of blood chemistry: Secondary | ICD-10-CM | POA: Diagnosis not present

## 2022-11-22 DIAGNOSIS — K219 Gastro-esophageal reflux disease without esophagitis: Secondary | ICD-10-CM | POA: Diagnosis not present

## 2022-11-22 DIAGNOSIS — Z1211 Encounter for screening for malignant neoplasm of colon: Secondary | ICD-10-CM

## 2022-11-22 DIAGNOSIS — F439 Reaction to severe stress, unspecified: Secondary | ICD-10-CM

## 2022-11-22 DIAGNOSIS — F1090 Alcohol use, unspecified, uncomplicated: Secondary | ICD-10-CM

## 2022-11-22 DIAGNOSIS — E7800 Pure hypercholesterolemia, unspecified: Secondary | ICD-10-CM

## 2022-11-22 DIAGNOSIS — Z789 Other specified health status: Secondary | ICD-10-CM

## 2022-11-22 DIAGNOSIS — R739 Hyperglycemia, unspecified: Secondary | ICD-10-CM

## 2022-11-22 MED ORDER — TRAZODONE HCL 50 MG PO TABS: ORAL_TABLET | ORAL | 1 refills | Status: DC

## 2022-11-22 NOTE — Progress Notes (Unsigned)
Subjective:    [name], female    DOB: Jul 20, 1950, 72 y.o.   MRN: [id number]  Patient here for  Chief Complaint  Patient presents with   Medical Management of Chronic Issues    HPI Follow up regarding hypercholesterolemia, GERD, hyperglycemia and abnormal liver function tests. Saw ortho - knee pain. Evaluated 04/19/22 - Dr Fay Records. Recommended aspiration and injection. Knee is some better. Continue to follow up with ortho. Had f/u 07/26/22 - recommended left total knee replacement. She wants to hold on surgery. Saw Dr Servando Snare 07/27/22 - f/u - after Bravo pH study. S/p dilation of esophageal stricture on recent EGD. PPI helps control acid reflux. Currently on protonix. Increased stress. Discussed.  Increased family stress.  Also stress with selling her house and moving.  Not sleeping. Has been a persistent issue.  Feels needs something to help her sleep. Discussed treatment options.  Interested in a trial of trazodone.  Also reports increased nasal congestion and sinus pressure.  Has been present over the last couple of weeks.  Some ear fullness.  No chest congestion.  No chest pain or sob.  No acid reflux now.    Past Medical History:  Diagnosis Date   Abdominal wall hernia    secondary to large left renal cyst removal   Arthritis    hands   Atrophic vaginitis    Car sickness    Carpal tunnel syndrome    Colon polyps    Complication of anesthesia    slow to wake   Diverticulosis    Fibrocystic breast disease    GERD (gastroesophageal reflux disease)    History of fatty infiltration of liver    Hyperlipidemia    PONV (postoperative nausea and vomiting)    Postmenopausal    Sinusitis    Past Surgical History:  Procedure Laterality Date   ABDOMINAL HYSTERECTOMY  2005   partial   APPENDECTOMY     BLEPHAROPLASTY     Bone Spur     removal - left index finger   BRAVO PH STUDY N/A 06/15/2022   Procedure: BRAVO PH STUDY;  Surgeon: Midge Minium, MD;  Location: [facility]  ENDOSCOPY;  Service: Endoscopy;  Laterality: N/A;   CATARACT EXTRACTION, BILATERAL  2000, 2005   COLONOSCOPY WITH PROPOFOL N/A 10/26/2019   Procedure: COLONOSCOPY WITH PROPOFOL;  Surgeon: Midge Minium, MD;  Location: [facility] SURGERY [facility];  Service: Endoscopy;  Laterality: N/A;   COLONOSCOPY WITH PROPOFOL N/A 06/15/2022   Procedure: COLONOSCOPY WITH PROPOFOL;  Surgeon: Midge Minium, MD;  Location: [facility] ENDOSCOPY;  Service: Endoscopy;  Laterality: N/A;   ESOPHAGOGASTRODUODENOSCOPY (EGD) WITH PROPOFOL N/A 10/26/2019   Procedure: ESOPHAGOGASTRODUODENOSCOPY (EGD) WITH PROPOFOL and Dilation;  Surgeon: Midge Minium, MD;  Location: [facility] SURGERY CNTR;  Service: Endoscopy;  Laterality: N/A;   ESOPHAGOGASTRODUODENOSCOPY (EGD) WITH PROPOFOL N/A 06/15/2022   Procedure: ESOPHAGOGASTRODUODENOSCOPY (EGD) WITH PROPOFOL;  Surgeon: Midge Minium, MD;  Location: [facility] ENDOSCOPY;  Service: Endoscopy;  Laterality: N/A;   EXCISION [facility]  [id number]   Dr. Recardo Evangelist   HERNIA REPAIR     lumb hernia     RENAL CYST EXCISION     TUBAL LIGATION     Family History  Problem Relation Age of Onset   Cancer Father        prostate   Lung cancer Other        parent   Hypercholesterolemia Other        parent   Social History   Socioeconomic History  Marital status: Married    Spouse name: Not on file   Number of children: 2   Years of education: Not on file   Highest education level: 12th grade  Occupational History   Not on file  Tobacco Use   Smoking status: Former   Smokeless tobacco: Never  Vaping Use   Vaping status: Not on file  Substance and Sexual Activity   Alcohol use: Yes    Alcohol/week: 5.0 - 6.0 standard drinks of alcohol    Types: 5 - 6 Glasses of wine per week   Drug use: No   Sexual activity: Not on file  Other Topics Concern   Not on file  Social History Narrative   Not on file   Social Determinants of Health   Financial Resource Strain: Low Risk  (11/21/2022)   Overall  Financial Resource Strain (CARDIA)    Difficulty of Paying Living Expenses: Not hard at all  Food Insecurity: No Food Insecurity (11/21/2022)   Hunger Vital Sign    Worried About Running Out of Food in the Last Year: Never true    Ran Out of Food in the Last Year: Never true  Transportation Needs: No Transportation Needs (11/21/2022)   PRAPARE - Administrator, Civil Service (Medical): No    Lack of Transportation (Non-Medical): No  Physical Activity: Unknown (11/21/2022)   Exercise Vital Sign    Days of Exercise per Week: Patient declined    Minutes of Exercise per Session: 50 min  Stress: No Stress Concern Present (04/27/2022)   [employer] of Occupational Health - Occupational Stress Questionnaire    Feeling of Stress : Not at all  Social Connections: Socially Integrated (11/21/2022)   Social Connection and Isolation Panel [NHANES]    Frequency of Communication with Friends and Family: More than three times a week    Frequency of Social Gatherings with Friends and Family: Twice a week    Attends Religious Services: More than 4 times per year    Active Member of [employer] or Organizations: No    Attends Engineer, structural: 1 to 4 times per year    Marital Status: Married     Review of Systems     Objective:     BP 118/68   Pulse 80   Temp 98.2 F (36.8 C)   Resp 16   Ht 5\' 1"  (1.549 m)   Wt 149 lb (67.6 kg)   LMP 02/28/1990   SpO2 98%   BMI 28.15 kg/m  Wt Readings from Last 3 Encounters:  11/22/22 149 lb (67.6 kg)  07/27/22 154 lb (69.9 kg)  06/15/22 153 lb 6.4 oz (69.6 kg)    Physical Exam   Outpatient Encounter Medications as of 11/22/2022  Medication Sig   traZODone (DESYREL) 50 MG tablet 1/2 tablet q hs   ALPRAZolam (XANAX) 0.25 MG tablet Take 1 tablet (0.25 mg total) by mouth daily as needed.   fexofenadine (ALLEGRA ALLERGY) 180 MG tablet Take 1 tablet (180 mg total) by mouth daily.   fluticasone (FLONASE) 50 MCG/ACT nasal spray  Place 2 sprays into both nostrils daily.   pantoprazole (PROTONIX) 40 MG tablet Take 1 tablet (40 mg total) by mouth daily.   Probiotic Product (PROBIOTIC-10) CHEW Chew by mouth daily.   sertraline (ZOLOFT) 50 MG tablet TAKE 1 TABLET BY MOUTH DAILY   [DISCONTINUED] albuterol (VENTOLIN HFA) 108 (90 Base) MCG/ACT inhaler Inhale 2 puffs into the lungs every 6 (six) hours as needed  for wheezing or shortness of breath.   [DISCONTINUED] MILK THISTLE PO Take by mouth 2 (two) times daily.   No facility-administered encounter medications on file as of 11/22/2022.     Lab Results  Component Value Date   WBC 9.5 01/19/2022   HGB 14.6 01/19/2022   HCT 43.5 01/19/2022   PLT 251.0 01/19/2022   GLUCOSE 97 04/21/2022   CHOL 217 (H) 04/21/2022   TRIG 224.0 (H) 04/21/2022   HDL 63.70 04/21/2022   LDLDIRECT 132.0 04/21/2022   LDLCALC 94 08/05/2020   ALT 38 (H) 04/21/2022   AST 29 04/21/2022   NA 140 04/21/2022   K 4.2 04/21/2022   CL 104 04/21/2022   CREATININE 0.74 04/21/2022   BUN 14 04/21/2022   CO2 25 04/21/2022   TSH 2.94 01/19/2022   HGBA1C 5.7 04/21/2022    No results found.     Assessment & Plan:  Hyperglycemia -     Hemoglobin A1c  Hypercholesterolemia -     Hepatic function panel -     Lipid panel -     Basic metabolic panel -     CBC with Differential/Platelet  Other orders -     traZODone HCl; 1/2 tablet q hs  Dispense: 30 tablet; Refill: 1     Dale [place], MD

## 2022-11-22 NOTE — Patient Instructions (Signed)
Mucinex 600mg  - one per day prn.   Continue saline nasal spray   Nasacort nasal spray - 2 sprays each nostril one time per day.  Use this in the pm.

## 2022-11-23 LAB — BASIC METABOLIC PANEL
BUN: 14 mg/dL (ref 6–23)
CO2: 26 meq/L (ref 19–32)
Calcium: 9.3 mg/dL (ref 8.4–10.5)
Chloride: 104 meq/L (ref 96–112)
Creatinine, Ser: 0.85 mg/dL (ref 0.40–1.20)
GFR: 69.06 mL/min (ref >60.00)
Glucose, Bld: 95 mg/dL (ref 70–99)
Potassium: 3.9 meq/L (ref 3.5–5.1)
Sodium: 140 meq/L (ref 135–145)

## 2022-11-23 LAB — LIPID PANEL
Cholesterol: 191 mg/dL (ref 0–200)
HDL: 59 mg/dL (ref >39.00)
LDL Cholesterol: 74 mg/dL (ref 0–99)
NonHDL: 132.29
Total CHOL/HDL Ratio: 3
Triglycerides: 289 mg/dL — ABNORMAL HIGH (ref 0.0–149.0)
VLDL: 57.8 mg/dL — ABNORMAL HIGH (ref 0.0–40.0)

## 2022-11-23 LAB — CBC WITH DIFFERENTIAL/PLATELET
Basophils Absolute: 0 10*3/uL (ref 0.0–0.1)
Basophils Relative: 0.4 % (ref 0.0–3.0)
Eosinophils Absolute: 0.3 10*3/uL (ref 0.0–0.7)
Eosinophils Relative: 2.6 % (ref 0.0–5.0)
HCT: 42.9 % (ref 36.0–46.0)
Hemoglobin: 14.2 g/dL (ref 12.0–15.0)
Lymphocytes Relative: 17.4 % (ref 12.0–46.0)
Lymphs Abs: 1.8 10*3/uL (ref 0.7–4.0)
MCHC: 33.1 g/dL (ref 30.0–36.0)
MCV: 100.3 fL — ABNORMAL HIGH (ref 78.0–100.0)
Monocytes Absolute: 0.7 10*3/uL (ref 0.1–1.0)
Monocytes Relative: 6.9 % (ref 3.0–12.0)
Neutro Abs: 7.6 10*3/uL (ref 1.4–7.7)
Neutrophils Relative %: 72.7 % (ref 43.0–77.0)
Platelets: 256 10*3/uL (ref 150.0–400.0)
RBC: 4.28 Mil/uL (ref 3.87–5.11)
RDW: 13.7 % (ref 11.5–15.5)
WBC: 10.4 10*3/uL (ref 4.0–10.5)

## 2022-11-23 LAB — HEPATIC FUNCTION PANEL
ALT: 42 U/L — ABNORMAL HIGH (ref 0–35)
AST: 30 U/L (ref 0–37)
Albumin: 4.1 g/dL (ref 3.5–5.2)
Alkaline Phosphatase: 68 U/L (ref 39–117)
Bilirubin, Direct: 0.1 mg/dL (ref 0.0–0.3)
Total Bilirubin: 0.6 mg/dL (ref 0.2–1.2)
Total Protein: 6.7 g/dL (ref 6.0–8.3)

## 2022-11-23 LAB — HEMOGLOBIN A1C: Hgb A1c MFr Bld: 5.4 % (ref 4.6–6.5)

## 2022-11-24 ENCOUNTER — Encounter: Payer: Self-pay | Admitting: Internal Medicine

## 2022-11-24 NOTE — Assessment & Plan Note (Signed)
Diet and exercise. Follow liver function tests.  Have discussed the need to stop/decrease alcohol intake.

## 2022-11-24 NOTE — Assessment & Plan Note (Signed)
Low carb diet and exercise.  Follow met b and a1c.

## 2022-11-24 NOTE — Assessment & Plan Note (Signed)
Colonoscopy 10/26/19 - pathology - tubular adenoma. Recommended f/u colonoscopy in 7 years.

## 2022-11-24 NOTE — Assessment & Plan Note (Signed)
On zoloft.  Increased stress as outlined.  Not sleeping.  Discussed treatment options.  Interested in trazodone.  Discussed medications. Denies increased alcohol intake.  Start trazodone as directed.  Follow.

## 2022-11-24 NOTE — Assessment & Plan Note (Signed)
The 10-year ASCVD risk score (Arnett DK, et al., 2019) is: 8.9%   Values used to calculate the score:     Age: 72 years     Sex: Female     Is Non-Hispanic African American: No     Diabetic: No     Tobacco smoker: No     Systolic Blood Pressure: 118 mmHg     Is BP treated: No     HDL Cholesterol: 59 mg/dL     Total Cholesterol: 191 mg/dL  Discussed diet and exercise.  Calculated cholesterol risk recommends to start cholesterol medication.  Follow lipid panel.

## 2022-11-24 NOTE — Assessment & Plan Note (Signed)
Saw ortho - knee pain. Evaluated 04/19/22 - Dr Fay Records. Recommended aspiration and injection. Had f/u 07/26/22 - recommended left total knee replacement. She wants to hold on surgery. Is managing with prn injecitions.

## 2022-11-24 NOTE — Assessment & Plan Note (Signed)
Saw Dr Servando Snare 07/27/22 - f/u - after Bravo pH study. S/p dilation of esophageal stricture on recent EGD. PPI helps control acid reflux. Currently on protonix.

## 2022-11-24 NOTE — Assessment & Plan Note (Signed)
Discussed.  Denies increased alcohol intake.  Follow.

## 2022-11-25 ENCOUNTER — Other Ambulatory Visit: Payer: Self-pay

## 2022-11-25 MED ORDER — ROSUVASTATIN CALCIUM 5 MG PO TABS: 5.0000 mg | ORAL_TABLET | Freq: Every day | ORAL | 1 refills | Status: DC

## 2022-11-29 ENCOUNTER — Telehealth: Payer: Self-pay | Admitting: Internal Medicine

## 2022-11-29 MED ORDER — CEFDINIR 300 MG PO CAPS: 300.0000 mg | ORAL_CAPSULE | Freq: Two times a day (BID) | ORAL | 0 refills | Status: DC

## 2022-11-29 MED ORDER — FLUCONAZOLE 150 MG PO TABS: ORAL_TABLET | ORAL | 0 refills | Status: DC

## 2022-11-29 NOTE — Telephone Encounter (Signed)
Confirmed only allergy erythromycin. Sent in [place] and advised below. Pt requested rx for diflucan to be sent with abx because she gets yeast infections when she has to take abx. Sent in diflucan 150mg  #2 no refill per Dr Lorin Picket.

## 2022-11-29 NOTE — Telephone Encounter (Signed)
Patient just called and wanted to let her provider know. She is still having problems with her sinus infection. She would like to see if she can get some antibiotic or something else for it. She did say the prescription that was prescribed for her sleep. Is helping. The pharmacy she use is [facility] - [place], [place] - [address] [place], [place] [address] Phone: [phone]  Fax: [phone]  Her number is [phone].

## 2022-11-29 NOTE — Telephone Encounter (Signed)
Confirm only allergy is erythromycin.  Given persistent symptoms  despite the regimen she outlined, ok to send in abx.  If only allergy is erythromycin, then ok to send in omnicef 300mg  bid x 10 days.  Needs to take probiotic (or eat yogurt) daily while on abx and for two weeks after completing abx.

## 2022-11-29 NOTE — Telephone Encounter (Signed)
Patient called stating she saw you on 10/14 and was having sinus symptoms. Was instructed to use saline, mucinex and nasacort. She says she was instructed to let us know if not improved. She is using Saline AM and some times in PM. Nasacort and mucinex in the evening. She says this has been going on for a few weeks. Wondering if she needs antibiotic. No abx allergies noted.

## 2022-12-30 DIAGNOSIS — Z86018 Personal history of other benign neoplasm: Secondary | ICD-10-CM | POA: Diagnosis not present

## 2022-12-30 DIAGNOSIS — Z872 Personal history of diseases of the skin and subcutaneous tissue: Secondary | ICD-10-CM | POA: Diagnosis not present

## 2022-12-30 DIAGNOSIS — Z85820 Personal history of malignant melanoma of skin: Secondary | ICD-10-CM | POA: Diagnosis not present

## 2022-12-30 DIAGNOSIS — L578 Other skin changes due to chronic exposure to nonionizing radiation: Secondary | ICD-10-CM | POA: Diagnosis not present

## 2022-12-30 DIAGNOSIS — D225 Melanocytic nevi of trunk: Secondary | ICD-10-CM | POA: Diagnosis not present

## 2022-12-30 DIAGNOSIS — D485 Neoplasm of uncertain behavior of skin: Secondary | ICD-10-CM | POA: Diagnosis not present

## 2023-01-14 ENCOUNTER — Encounter: Payer: Self-pay | Admitting: Internal Medicine

## 2023-01-14 ENCOUNTER — Ambulatory Visit (INDEPENDENT_AMBULATORY_CARE_PROVIDER_SITE_OTHER): Payer: Medicare Other | Admitting: Internal Medicine

## 2023-01-14 VITALS — BP 108/66 | HR 70 | Temp 97.9°F | Resp 16 | Ht 61.0 in | Wt 147.0 lb

## 2023-01-14 DIAGNOSIS — E7800 Pure hypercholesterolemia, unspecified: Secondary | ICD-10-CM

## 2023-01-14 DIAGNOSIS — K222 Esophageal obstruction: Secondary | ICD-10-CM

## 2023-01-14 DIAGNOSIS — F439 Reaction to severe stress, unspecified: Secondary | ICD-10-CM | POA: Diagnosis not present

## 2023-01-14 DIAGNOSIS — R739 Hyperglycemia, unspecified: Secondary | ICD-10-CM

## 2023-01-14 DIAGNOSIS — N281 Cyst of kidney, acquired: Secondary | ICD-10-CM

## 2023-01-14 DIAGNOSIS — Z789 Other specified health status: Secondary | ICD-10-CM

## 2023-01-14 DIAGNOSIS — K219 Gastro-esophageal reflux disease without esophagitis: Secondary | ICD-10-CM

## 2023-01-14 LAB — HEPATIC FUNCTION PANEL
ALT: 32 U/L (ref 0–35)
AST: 28 U/L (ref 0–37)
Albumin: 4.5 g/dL (ref 3.5–5.2)
Alkaline Phosphatase: 70 U/L (ref 39–117)
Bilirubin, Direct: 0.1 mg/dL (ref 0.0–0.3)
Total Bilirubin: 0.6 mg/dL (ref 0.2–1.2)
Total Protein: 7 g/dL (ref 6.0–8.3)

## 2023-01-14 NOTE — Progress Notes (Unsigned)
Subjective:    [name], female    DOB: 1950-08-01, 71 y.o.   MRN: [id number]  Patient here for  Chief Complaint  Patient presents with   Medical Management of Chronic Issues    HPI Follow up regarding hypercholesterolemia, GERD, hyperglycemia and abnormal liver function tests. Saw ortho - knee pain. Evaluated 04/19/22 - Dr Fay Records. Recommended aspiration and injection. Had f/u 07/26/22 - recommended left total knee replacement. She wants to hold on surgery. Is managing with prn injecitions. Referred to Dr Odis Luster. Saw Dr Servando Snare 07/27/22 - f/u - after Bravo pH study. S/p dilation of esophageal stricture on recent EGD. PPI helps control acid reflux. Currently on protonix. She is on zoloft.  Last visit, discussed increased stress.  Trial of trazodone. Doing well on trazodone.  Sleeping well.  Tolerating the medication. Still with increased stress, but is handling things relatively well. Breathing stable.  No abdominal pain or bowel change.    Past Medical History:  Diagnosis Date   Abdominal wall hernia    secondary to large left renal cyst removal   Arthritis    hands   Atrophic vaginitis    Car sickness    Carpal tunnel syndrome    Colon polyps    Complication of anesthesia    slow to wake   Diverticulosis    Fibrocystic breast disease    GERD (gastroesophageal reflux disease)    History of fatty infiltration of liver    Hyperlipidemia    PONV (postoperative nausea and vomiting)    Postmenopausal    Sinusitis    Past Surgical History:  Procedure Laterality Date   ABDOMINAL HYSTERECTOMY  2005   partial   APPENDECTOMY     BLEPHAROPLASTY     Bone Spur     removal - left index finger   BRAVO PH STUDY N/A 06/15/2022   Procedure: BRAVO PH STUDY;  Surgeon: Midge Minium, MD;  Location: [facility] ENDOSCOPY;  Service: Endoscopy;  Laterality: N/A;   CATARACT EXTRACTION, BILATERAL  2000, 2005   COLONOSCOPY WITH PROPOFOL N/A 10/26/2019   Procedure: COLONOSCOPY WITH PROPOFOL;   Surgeon: Midge Minium, MD;  Location: [facility]ERY [facility];  Service: Endoscopy;  Laterality: N/A;   COLONOSCOPY WITH PROPOFOL N/A 06/15/2022   Procedure: COLONOSCOPY WITH PROPOFOL;  Surgeon: Midge Minium, MD;  Location: [facility] ENDOSCOPY;  Service: Endoscopy;  Laterality: N/A;   ESOPHAGOGASTRODUODENOSCOPY (EGD) WITH PROPOFOL N/A 10/26/2019   Procedure: ESOPHAGOGASTRODUODENOSCOPY (EGD) WITH PROPOFOL and Dilation;  Surgeon: Midge Minium, MD;  Location: [facility]ERY [facility];  Service: Endoscopy;  Laterality: N/A;   ESOPHAGOGASTRODUODENOSCOPY (EGD) WITH PROPOFOL N/A 06/15/2022   Procedure: ESOPHAGOGASTRODUODENOSCOPY (EGD) WITH PROPOFOL;  Surgeon: Midge Minium, MD;  Location: [facility] ENDOSCOPY;  Service: Endoscopy;  Laterality: N/A;   EXCISION [facility]  [id number]   Dr. Recardo Evangelist   HERNIA REPAIR     lumb hernia     RENAL CYST EXCISION     TUBAL LIGATION     Family History  Problem Relation Age of Onset   Cancer Father        prostate   Lung cancer Other        parent   Hypercholesterolemia Other        parent   Social History   Socioeconomic History   Marital status: Married    Spouse name: Not on file   Number of children: 2   Years of education: Not on file   Highest education level: 12th grade  Occupational History  Not on file  Tobacco Use   Smoking status: Former   Smokeless tobacco: Never  Vaping Use   Vaping status: Not on file  Substance and Sexual Activity   Alcohol use: Yes    Alcohol/week: 5.0 - 6.0 standard drinks of alcohol    Types: 5 - 6 Glasses of wine per week   Drug use: No   Sexual activity: Not on file  Other Topics Concern   Not on file  Social History Narrative   Not on file   Social Determinants of Health   Financial Resource Strain: Low Risk  (11/21/2022)   Overall Financial Resource Strain (CARDIA)    Difficulty of Paying Living Expenses: Not hard at all  Food Insecurity: No Food Insecurity (11/21/2022)   Hunger Vital Sign    Worried About  Running Out of Food in the Last Year: Never true    Ran Out of Food in the Last Year: Never true  Transportation Needs: No Transportation Needs (11/21/2022)   PRAPARE - Administrator, Civil Service (Medical): No    Lack of Transportation (Non-Medical): No  Physical Activity: Unknown (11/21/2022)   Exercise Vital Sign    Days of Exercise per Week: Patient declined    Minutes of Exercise per Session: 50 min  Stress: No Stress Concern Present (04/27/2022)   [employer] of Occupational Health - Occupational Stress Questionnaire    Feeling of Stress : Not at all  Social Connections: Socially Integrated (11/21/2022)   Social Connection and Isolation Panel [NHANES]    Frequency of Communication with Friends and Family: More than three times a week    Frequency of Social Gatherings with Friends and Family: Twice a week    Attends Religious Services: More than 4 times per year    Active Member of [employer] or Organizations: No    Attends Engineer, structural: 1 to 4 times per year    Marital Status: Married     Review of Systems  Constitutional:  Negative for appetite change and unexpected weight change.  HENT:  Negative for sinus pressure.        Some nasal congestion.  Controls with saline nasal spray. Does not feel needs anything more.   Respiratory:  Negative for cough, chest tightness and shortness of breath.   Cardiovascular:  Negative for chest pain, palpitations and leg swelling.  Gastrointestinal:  Negative for abdominal pain, diarrhea, nausea and vomiting.  Genitourinary:  Negative for difficulty urinating and dysuria.  Musculoskeletal:  Negative for joint swelling and myalgias.  Skin:  Negative for color change and rash.  Neurological:  Negative for dizziness and headaches.  Psychiatric/Behavioral:  Negative for agitation and dysphoric mood.        Objective:     BP 108/66   Pulse 70   Temp 97.9 F (36.6 C)   Resp 16   Ht 5\' 1"  (1.549 m)   Wt  147 lb (66.7 kg)   LMP 02/28/1990   SpO2 98%   BMI 27.78 kg/m  Wt Readings from Last 3 Encounters:  01/14/23 147 lb (66.7 kg)  11/22/22 149 lb (67.6 kg)  07/27/22 154 lb (69.9 kg)    Physical Exam Vitals reviewed.  Constitutional:      General: She is not in acute distress.    Appearance: Normal appearance.  HENT:     Head: Normocephalic and atraumatic.     Right Ear: External ear normal.     Left Ear: External ear normal.  Eyes:     General: No scleral icterus.       Right eye: No discharge.        Left eye: No discharge.     Conjunctiva/sclera: Conjunctivae normal.  Neck:     Thyroid: No thyromegaly.  Cardiovascular:     Rate and Rhythm: Normal rate and regular rhythm.  Pulmonary:     Effort: No respiratory distress.     Breath sounds: Normal breath sounds. No wheezing.  Abdominal:     General: Bowel sounds are normal.     Palpations: Abdomen is soft.     Tenderness: There is no abdominal tenderness.  Musculoskeletal:        General: No swelling or tenderness.     Cervical back: Neck supple. No tenderness.  Lymphadenopathy:     Cervical: No cervical adenopathy.  Skin:    Findings: No erythema or rash.  Neurological:     Mental Status: She is alert.  Psychiatric:        Mood and Affect: Mood normal.        Behavior: Behavior normal.      Outpatient Encounter Medications as of 01/14/2023  Medication Sig   ALPRAZolam (XANAX) 0.25 MG tablet Take 1 tablet (0.25 mg total) by mouth daily as needed.   fexofenadine (ALLEGRA ALLERGY) 180 MG tablet Take 1 tablet (180 mg total) by mouth daily.   fluticasone (FLONASE) 50 MCG/ACT nasal spray Place 2 sprays into both nostrils daily.   pantoprazole (PROTONIX) 40 MG tablet Take 1 tablet (40 mg total) by mouth daily.   Probiotic Product (PROBIOTIC-10) CHEW Chew by mouth daily.   rosuvastatin (CRESTOR) 5 MG tablet Take 1 tablet (5 mg total) by mouth daily.   sertraline (ZOLOFT) 50 MG tablet TAKE 1 TABLET BY MOUTH DAILY    traZODone (DESYREL) 50 MG tablet 1/2 tablet q hs   [DISCONTINUED] cefdinir (OMNICEF) 300 MG capsule Take 1 capsule (300 mg total) by mouth 2 (two) times daily.   [DISCONTINUED] fluconazole (DIFLUCAN) 150 MG tablet Take one tablet PO once. If sxs persist, may take one tablet PO 3 days later.   No facility-administered encounter medications on file as of 01/14/2023.     Lab Results  Component Value Date   WBC 10.4 11/22/2022   HGB 14.2 11/22/2022   HCT 42.9 11/22/2022   PLT 256.0 11/22/2022   GLUCOSE 95 11/22/2022   CHOL 191 11/22/2022   TRIG 289.0 (H) 11/22/2022   HDL 59.00 11/22/2022   LDLDIRECT 132.0 04/21/2022   LDLCALC 74 11/22/2022   ALT 42 (H) 11/22/2022   AST 30 11/22/2022   NA 140 11/22/2022   K 3.9 11/22/2022   CL 104 11/22/2022   CREATININE 0.85 11/22/2022   BUN 14 11/22/2022   CO2 26 11/22/2022   TSH 2.94 01/19/2022   HGBA1C 5.4 11/22/2022       Assessment & Plan:  Hypercholesterolemia -     Hepatic function panel     Dale Gakona, MD

## 2023-01-16 ENCOUNTER — Encounter: Payer: Self-pay | Admitting: Internal Medicine

## 2023-01-16 NOTE — Assessment & Plan Note (Signed)
S/p dilation of esophageal stricture on recent EGD. PPI helps control acid reflux. Currently on protonix.

## 2023-01-16 NOTE — Assessment & Plan Note (Signed)
Saw Dr Servando Snare 07/27/22 - f/u - after Bravo pH study. S/p dilation of esophageal stricture on recent EGD. PPI helps control acid reflux. Currently on protonix.

## 2023-01-16 NOTE — Assessment & Plan Note (Signed)
Discussed.  Denies increased alcohol intake.  Follow.

## 2023-01-16 NOTE — Assessment & Plan Note (Signed)
Dr Brandon (10/2021) - Bilateral renal cyst of varying complexity, primarily Bosniak 1 and 2.  Recommend f/u renal ultrasound in 2 years.  

## 2023-01-16 NOTE — Assessment & Plan Note (Addendum)
Doing well on zoloft and trazodone.  Discussed decreased alcohol intake and avoiding mixing medication with alcohol.  Follow.

## 2023-01-16 NOTE — Assessment & Plan Note (Addendum)
The 10-year ASCVD risk score (Arnett DK, et al., 2019) is: 7.5%   Values used to calculate the score:     Age: 72 years     Sex: Female     Is Non-Hispanic African American: No     Diabetic: No     Tobacco smoker: No     Systolic Blood Pressure: 108 mmHg     Is BP treated: No     HDL Cholesterol: 59 mg/dL     Total Cholesterol: 191 mg/dL  Discussed diet and exercise.  Calculated cholesterol risk recommends to start cholesterol medication.  On crestor. Tolerating. Follow lipid panel.

## 2023-01-16 NOTE — Assessment & Plan Note (Signed)
Low carb diet and exercise.  Follow met b and a1c.   

## 2023-01-17 DIAGNOSIS — M1712 Unilateral primary osteoarthritis, left knee: Secondary | ICD-10-CM | POA: Diagnosis not present

## 2023-02-21 ENCOUNTER — Other Ambulatory Visit: Payer: Self-pay | Admitting: Internal Medicine

## 2023-02-22 ENCOUNTER — Telehealth (INDEPENDENT_AMBULATORY_CARE_PROVIDER_SITE_OTHER): Payer: Medicare Other | Admitting: Nurse Practitioner

## 2023-02-22 ENCOUNTER — Encounter: Payer: Self-pay | Admitting: Internal Medicine

## 2023-02-22 VITALS — Ht 61.0 in | Wt 140.0 lb

## 2023-02-22 DIAGNOSIS — J0100 Acute maxillary sinusitis, unspecified: Secondary | ICD-10-CM

## 2023-02-22 DIAGNOSIS — T3695XA Adverse effect of unspecified systemic antibiotic, initial encounter: Secondary | ICD-10-CM | POA: Diagnosis not present

## 2023-02-22 DIAGNOSIS — B379 Candidiasis, unspecified: Secondary | ICD-10-CM | POA: Diagnosis not present

## 2023-02-22 MED ORDER — FLUCONAZOLE 150 MG PO TABS
ORAL_TABLET | ORAL | 0 refills | Status: DC
Start: 2023-02-22 — End: 2023-03-10

## 2023-02-22 MED ORDER — AMOXICILLIN-POT CLAVULANATE 875-125 MG PO TABS
1.0000 | ORAL_TABLET | Freq: Two times a day (BID) | ORAL | 0 refills | Status: DC
Start: 2023-02-22 — End: 2023-03-10

## 2023-02-22 NOTE — Telephone Encounter (Signed)
Scheduled for virtual visit   

## 2023-02-22 NOTE — Assessment & Plan Note (Signed)
 History of vaginitis with antibiotic use. We will prescribe prophylactic Diflucan to take with Augmentin.

## 2023-02-22 NOTE — Progress Notes (Signed)
 MyChart Video Visit    Virtual Visit via Video Note   This visit type was conducted because this format is felt to be most appropriate for this patient at this time. Physical exam was limited by quality of the video and audio technology used for the visit. CMA was able to get the patient set up on a video visit.  Patient location: Patient in restaurant, counseled on HIPAA and if others could overhear our conversation. Patient wished to proceed with video visit.  Provider location: Office  I discussed the limitations of evaluation and management by telemedicine and the availability of in person appointments. The patient expressed understanding and agreed to proceed.  Visit Date: 02/22/2023  Today's healthcare provider: Leron Glance, NP     Subjective:    [name], female    DOB: 1950-06-15, 73 y.o.   MRN: [id number]  Chief Complaint  Patient presents with   Sinus Problem    Sxs started: 10 days ago Nasal congestion, ear pain and , and tender under eyes Denies cough or headaches    HPI Discussed the use of AI scribe software for clinical note transcription with the patient, who gave verbal consent to proceed.  History of Present Illness   The patient, with a history of recurrent sinus infections, presents with a 10-day history of symptoms consistent with a sinus infection. She reports nasal congestion and postnasal drainage, along with ear discomfort described as popping and fullness. She denies any chest symptoms, cough, or sore throat. She also denies fevers but reports afternoon fatigue and body aches. She notes tenderness under her eyes, in the sinus region, but denies headaches, nausea, or vomiting. She has not been exposed to any known triggers.  The patient has been self-managing with over-the-counter Allegra  and saline solution. She reports a history of nosebleeds and colored mucus during these episodes, which occur approximately once a year. She has  previously taken Augmentin  for this condition. She also reports a history of vaginitis when on antibiotics, for which she has previously taken Diflucan .      Past Medical History:  Diagnosis Date   Abdominal wall hernia    secondary to large left renal cyst removal   Arthritis    hands   Atrophic vaginitis    Car sickness    Carpal tunnel syndrome    Colon polyps    Complication of anesthesia    slow to wake   Diverticulosis    Fibrocystic breast disease    GERD (gastroesophageal reflux disease)    History of fatty infiltration of liver    Hyperlipidemia    PONV (postoperative nausea and vomiting)    Postmenopausal    Sinusitis     Past Surgical History:  Procedure Laterality Date   ABDOMINAL HYSTERECTOMY  2005   partial   APPENDECTOMY     BLEPHAROPLASTY     Bone Spur     removal - left index finger   BRAVO PH STUDY N/A 06/15/2022   Procedure: BRAVO PH STUDY;  Surgeon: Jinny Carmine, MD;  Location: [facility];  Service: Endoscopy;  Laterality: N/A;   CATARACT EXTRACTION, BILATERAL  2000, 2005   COLONOSCOPY WITH PROPOFOL  N/A 10/26/2019   Procedure: COLONOSCOPY WITH PROPOFOL ;  Surgeon: Jinny Carmine, MD;  Location: [facility];  Service: Endoscopy;  Laterality: N/A;   COLONOSCOPY WITH PROPOFOL  N/A 06/15/2022   Procedure: COLONOSCOPY WITH PROPOFOL ;  Surgeon: Jinny Carmine, MD;  Location: [facility];  Service: Endoscopy;  Laterality:  N/A;   ESOPHAGOGASTRODUODENOSCOPY (EGD) WITH PROPOFOL  N/A 10/26/2019   Procedure: ESOPHAGOGASTRODUODENOSCOPY (EGD) WITH PROPOFOL  and Dilation;  Surgeon: Jinny Carmine, MD;  Location: [facility];  Service: Endoscopy;  Laterality: N/A;   ESOPHAGOGASTRODUODENOSCOPY (EGD) WITH PROPOFOL  N/A 06/15/2022   Procedure: ESOPHAGOGASTRODUODENOSCOPY (EGD) WITH PROPOFOL ;  Surgeon: Jinny Carmine, MD;  Location: [facility];  Service: Endoscopy;  Laterality: N/A;   EXCISION [facility]  [id number]   Dr. Donnice Cory   HERNIA REPAIR     lumb  hernia     RENAL CYST EXCISION     TUBAL LIGATION      Family History  Problem Relation Age of Onset   Cancer Father        prostate   Lung cancer Other        parent   Hypercholesterolemia Other        parent    Social History   Socioeconomic History   Marital status: Married    Spouse name: Not on file   Number of children: 2   Years of education: Not on file   Highest education level: 12th grade  Occupational History   Not on file  Tobacco Use   Smoking status: Former   Smokeless tobacco: Never  Vaping Use   Vaping status: Not on file  Substance and Sexual Activity   Alcohol use: Yes    Alcohol/week: 5.0 - 6.0 standard drinks of alcohol    Types: 5 - 6 Glasses of wine per week   Drug use: No   Sexual activity: Not on file  Other Topics Concern   Not on file  Social History Narrative   Not on file   Social Drivers of Health   Financial Resource Strain: Low Risk  (11/21/2022)   Overall Financial Resource Strain (CARDIA)    Difficulty of Paying Living Expenses: Not hard at all  Food Insecurity: No Food Insecurity (11/21/2022)   Hunger Vital Sign    Worried About Running Out of Food in the Last Year: Never true    Ran Out of Food in the Last Year: Never true  Transportation Needs: No Transportation Needs (11/21/2022)   PRAPARE - Administrator, Civil Service (Medical): No    Lack of Transportation (Non-Medical): No  Physical Activity: Unknown (11/21/2022)   Exercise Vital Sign    Days of Exercise per Week: Patient declined    Minutes of Exercise per Session: 50 min  Stress: No Stress Concern Present (04/27/2022)   [employer] of Occupational Health - Occupational Stress Questionnaire    Feeling of Stress : Not at all  Social Connections: Socially Integrated (11/21/2022)   Social Connection and Isolation Panel [NHANES]    Frequency of Communication with Friends and Family: More than three times a week    Frequency of Social Gatherings with  Friends and Family: Twice a week    Attends Religious Services: More than 4 times per year    Active Member of [employer] or Organizations: No    Attends Engineer, Structural: 1 to 4 times per year    Marital Status: Married  Catering Manager Violence: Not At Risk (11/07/2019)   Humiliation, Afraid, Rape, and Kick questionnaire    Fear of Current or Ex-Partner: No    Emotionally Abused: No    Physically Abused: No    Sexually Abused: No    Outpatient Medications Prior to Visit  Medication Sig Dispense Refill   fexofenadine  (ALLEGRA  ALLERGY) 180 MG tablet Take 1  tablet (180 mg total) by mouth daily. 90 tablet 1   rosuvastatin  (CRESTOR ) 5 MG tablet TAKE 1 TABLET BY MOUTH DAILY 90 tablet 1   sertraline  (ZOLOFT ) 50 MG tablet TAKE 1 TABLET BY MOUTH DAILY 90 tablet 1   traZODone  (DESYREL ) 50 MG tablet 1/2 tablet q hs 30 tablet 1   ALPRAZolam  (XANAX ) 0.25 MG tablet Take 1 tablet (0.25 mg total) by mouth daily as needed. (Patient not taking: Reported on 02/22/2023) 30 tablet 0   fluticasone  (FLONASE ) 50 MCG/ACT nasal spray Place 2 sprays into both nostrils daily. (Patient not taking: Reported on 02/22/2023) 16 g 0   pantoprazole  (PROTONIX ) 40 MG tablet Take 1 tablet (40 mg total) by mouth daily. 30 tablet 11   Probiotic Product (PROBIOTIC-10) CHEW Chew by mouth daily. (Patient not taking: Reported on 02/22/2023)     No facility-administered medications prior to visit.    Allergies  Allergen Reactions   Erythromycin Diarrhea   Erythromycin Base     Other Reaction(s): GI Intolerance   Erythromycin Ethylsuccinate     ROS See HPI    Objective:    Physical Exam  Ht 5' 1 (1.549 m)   Wt 140 lb (63.5 kg) Comment: pt reported  LMP 02/28/1990   BMI 26.45 kg/m  Wt Readings from Last 3 Encounters:  02/22/23 140 lb (63.5 kg)  01/14/23 147 lb (66.7 kg)  11/22/22 149 lb (67.6 kg)   GENERAL: alert, oriented, appears well and in no acute distress   HEENT: atraumatic, conjunttiva clear,  no obvious abnormalities on inspection of external nose and ears   NECK: normal movements of the head and neck   LUNGS: on inspection no signs of respiratory distress, breathing rate appears normal, no obvious gross SOB, gasping or wheezing   CV: no obvious cyanosis   MS: moves all visible extremities without noticeable abnormality   PSYCH/NEURO: pleasant and cooperative, no obvious depression or anxiety, speech and thought processing grossly intact    Assessment & Plan:   Problem List Items Addressed This Visit       Respiratory   Acute non-recurrent maxillary sinusitis - Primary   Experiencing nasal congestion, postnasal drainage, and sinus tenderness for 10 days without fever, cough, or chest symptoms. Consistent with sinusitis. Similar episodes occur annually. Start Augmentin  875 BID x 10 days. Continue Allegra  and saline nasal spray as needed. Advised adequate fluid intake. Return precautions given to patient.       Relevant Medications   amoxicillin -clavulanate (AUGMENTIN ) 875-125 MG tablet   fluconazole  (DIFLUCAN ) 150 MG tablet     Other   Antibiotic-induced yeast infection   History of vaginitis with antibiotic use. We will prescribe prophylactic Diflucan  to take with Augmentin .       Relevant Medications   fluconazole  (DIFLUCAN ) 150 MG tablet    I am having Kara Burns start on amoxicillin -clavulanate and fluconazole . I am also having her maintain her Probiotic-10, fluticasone , pantoprazole , fexofenadine , sertraline , ALPRAZolam , traZODone , and rosuvastatin .  Meds ordered this encounter  Medications   amoxicillin -clavulanate (AUGMENTIN ) 875-125 MG tablet    Sig: Take 1 tablet by mouth 2 (two) times daily.    Dispense:  20 tablet    Refill:  0    Patient would like delivered if possible    Supervising Provider:   MARIBETH, ERIC G [[id number]]   fluconazole  (DIFLUCAN ) 150 MG tablet    Sig: Take 1 tablet by mouth x 1 dose. May repeat in 72 hours if needed.  Dispense:  2 tablet    Refill:  0    Supervising Provider:   MARIBETH, ERIC G [[id number]]   I discussed the assessment and treatment plan with the patient. The patient was provided an opportunity to ask questions and all were answered. The patient agreed with the plan and demonstrated an understanding of the instructions.   The patient was advised to call back or seek an in-person evaluation if the symptoms worsen or if the condition fails to improve as anticipated.   Leron Glance, NP [facility] at [facility] [phone] (phone) [phone] (fax)  [facility]

## 2023-02-22 NOTE — Assessment & Plan Note (Addendum)
 Experiencing nasal congestion, postnasal drainage, and sinus tenderness for 10 days without fever, cough, or chest symptoms. Consistent with sinusitis. Similar episodes occur annually. Start Augmentin  875 BID x 10 days. Continue Allegra  and saline nasal spray as needed. Advised adequate fluid intake. Return precautions given to patient.

## 2023-03-10 ENCOUNTER — Ambulatory Visit (INDEPENDENT_AMBULATORY_CARE_PROVIDER_SITE_OTHER): Payer: Medicare Other | Admitting: Internal Medicine

## 2023-03-10 ENCOUNTER — Encounter: Payer: Self-pay | Admitting: Internal Medicine

## 2023-03-10 VITALS — BP 118/62 | HR 78 | Ht 61.0 in | Wt 142.4 lb

## 2023-03-10 DIAGNOSIS — R5383 Other fatigue: Secondary | ICD-10-CM

## 2023-03-10 DIAGNOSIS — M791 Myalgia, unspecified site: Secondary | ICD-10-CM

## 2023-03-10 DIAGNOSIS — R221 Localized swelling, mass and lump, neck: Secondary | ICD-10-CM

## 2023-03-10 DIAGNOSIS — Z789 Other specified health status: Secondary | ICD-10-CM

## 2023-03-10 DIAGNOSIS — R7989 Other specified abnormal findings of blood chemistry: Secondary | ICD-10-CM | POA: Diagnosis not present

## 2023-03-10 DIAGNOSIS — R590 Localized enlarged lymph nodes: Secondary | ICD-10-CM

## 2023-03-10 DIAGNOSIS — R634 Abnormal weight loss: Secondary | ICD-10-CM

## 2023-03-10 DIAGNOSIS — T50905A Adverse effect of unspecified drugs, medicaments and biological substances, initial encounter: Secondary | ICD-10-CM

## 2023-03-10 LAB — CBC WITH DIFFERENTIAL/PLATELET
Basophils Absolute: 0.1 10*3/uL (ref 0.0–0.1)
Basophils Relative: 0.9 % (ref 0.0–3.0)
Eosinophils Absolute: 0.2 10*3/uL (ref 0.0–0.7)
Eosinophils Relative: 3.1 % (ref 0.0–5.0)
HCT: 40.4 % (ref 36.0–46.0)
Hemoglobin: 13.5 g/dL (ref 12.0–15.0)
Lymphocytes Relative: 26 % (ref 12.0–46.0)
Lymphs Abs: 1.8 10*3/uL (ref 0.7–4.0)
MCHC: 33.3 g/dL (ref 30.0–36.0)
MCV: 100.4 fL — ABNORMAL HIGH (ref 78.0–100.0)
Monocytes Absolute: 0.7 10*3/uL (ref 0.1–1.0)
Monocytes Relative: 10.3 % (ref 3.0–12.0)
Neutro Abs: 4.2 10*3/uL (ref 1.4–7.7)
Neutrophils Relative %: 59.7 % (ref 43.0–77.0)
Platelets: 238 10*3/uL (ref 150.0–400.0)
RBC: 4.02 Mil/uL (ref 3.87–5.11)
RDW: 12.3 % (ref 11.5–15.5)
WBC: 7.1 10*3/uL (ref 4.0–10.5)

## 2023-03-10 LAB — COMPREHENSIVE METABOLIC PANEL
ALT: 44 U/L — ABNORMAL HIGH (ref 0–35)
AST: 37 U/L (ref 0–37)
Albumin: 4.5 g/dL (ref 3.5–5.2)
Alkaline Phosphatase: 67 U/L (ref 39–117)
BUN: 13 mg/dL (ref 6–23)
CO2: 27 meq/L (ref 19–32)
Calcium: 9.3 mg/dL (ref 8.4–10.5)
Chloride: 104 meq/L (ref 96–112)
Creatinine, Ser: 0.73 mg/dL (ref 0.40–1.20)
GFR: 82.72 mL/min (ref >60.00)
Glucose, Bld: 84 mg/dL (ref 70–99)
Potassium: 4.1 meq/L (ref 3.5–5.1)
Sodium: 140 meq/L (ref 135–145)
Total Bilirubin: 0.6 mg/dL (ref 0.2–1.2)
Total Protein: 7 g/dL (ref 6.0–8.3)

## 2023-03-10 LAB — TSH: TSH: 2.5 u[IU]/mL (ref 0.35–5.50)

## 2023-03-10 LAB — B12 AND FOLATE PANEL
Folate: 13.6 ng/mL (ref >5.9)
Vitamin B-12: >1537 pg/mL — ABNORMAL HIGH (ref 211–911)

## 2023-03-10 LAB — CK: Total CK: 43 U/L (ref 7–177)

## 2023-03-10 NOTE — Assessment & Plan Note (Signed)
Has been taking tirzepitide through a weight loss clinic since August .

## 2023-03-10 NOTE — Assessment & Plan Note (Addendum)
HISTORY SUGGESTIVE OF POST VIRAL SYNDROME GIVEN RECENT TREATMENT FOR SINUSITIS/OTITIS,  BUT WILL RULE OUT ANEMIA, B12 DEFICIENCY (GIVEN MACROCYTOSIS NOTED N PRIOR CBC), HEPATITIS, ETC.  Depression screen negative

## 2023-03-10 NOTE — Assessment & Plan Note (Addendum)
VERSUS BILATERAL LIPOMAS.  ULTRASOUND OF NECK ORDERED AND NEGATIVE FOR MASSES/LAD

## 2023-03-10 NOTE — Progress Notes (Addendum)
Subjective:  [name], female    DOB: [birth year]  Age: 73 y.o. MRN: [id number]  CC: The primary encounter diagnosis was Fatigue, unspecified type. Diagnoses of Muscle ache, LAD (lymphadenopathy), anterior cervical, Abnormal liver function test, Weight loss due to medication, and Alcohol use (HCC) were also pertinent to this visit.   HPI SEMIAH KONCZAL presents for  Chief Complaint  Patient presents with   Fatigue    Fatigue and body aches for about 3-4 weeks   Cc:  fatigue for the past 3-4 weeks, accompanied by body aches . Worse in the afternoon.    No fevers,  diarrhea,  but feels achy life a fever  on a daily basis . No sympotms during sleep ,  no anorexia. Except that which is induced by use of tirzepitide. Was  treated jan 14 for sinusitis , 10 day history of malaise ,  ear pain sinus congestion,  with augmentin and flonase via  Virtual visit.  Today .   Alcohol use:  Averages 2 cocktails per night.    Has been taking zepbound for weight loss since August through a weight loss clinic. Has had intended weight loss of 12 lbs   and has reached her desired goal.   Has been taking crestor for the last 3 months   Outpatient Medications Prior to Visit  Medication Sig Dispense Refill   ALPRAZolam (XANAX) 0.25 MG tablet Take 1 tablet (0.25 mg total) by mouth daily as needed. 30 tablet 0   fexofenadine (ALLEGRA ALLERGY) 180 MG tablet Take 1 tablet (180 mg total) by mouth daily. 90 tablet 1   pantoprazole (PROTONIX) 40 MG tablet Take 1 tablet (40 mg total) by mouth daily. 30 tablet 11   rosuvastatin (CRESTOR) 5 MG tablet TAKE 1 TABLET BY MOUTH DAILY 90 tablet 1   sertraline (ZOLOFT) 50 MG tablet TAKE 1 TABLET BY MOUTH DAILY 90 tablet 1   traZODone (DESYREL) 50 MG tablet 1/2 tablet q hs 30 tablet 1   amoxicillin-clavulanate (AUGMENTIN) 875-125 MG tablet Take 1 tablet by mouth 2 (two) times daily. 20 tablet 0   fluconazole (DIFLUCAN) 150 MG tablet Take 1 tablet by mouth x 1  dose. May repeat in 72 hours if needed. 2 tablet 0   fluticasone (FLONASE) 50 MCG/ACT nasal spray Place 2 sprays into both nostrils daily. (Patient not taking: Reported on 02/22/2023) 16 g 0   Probiotic Product (PROBIOTIC-10) CHEW Chew by mouth daily. (Patient not taking: Reported on 02/22/2023)     No facility-administered medications prior to visit.    Review of Systems;  Patient denies headache, fevers, malaise, unintentional weight loss, skin rash, eye pain, sinus congestion and sinus pain, sore throat, dysphagia,  hemoptysis , cough, dyspnea, wheezing, chest pain, palpitations, orthopnea, edema, abdominal pain, nausea, melena, diarrhea, constipation, flank pain, dysuria, hematuria, urinary  Frequency, nocturia, numbness, tingling, seizures,  Focal weakness, Loss of consciousness,  Tremor, insomnia, depression, anxiety, and suicidal ideation.      Objective:  BP 118/62   Pulse 78   Ht 5\' 1"  (1.549 m)   Wt 142 lb 6.4 oz (64.6 kg)   LMP 02/28/1990   SpO2 96%   BMI 26.91 kg/m   BP Readings from Last 3 Encounters:  03/10/23 118/62  01/14/23 108/66  11/22/22 118/68    Wt Readings from Last 3 Encounters:  03/10/23 142 lb 6.4 oz (64.6 kg)  02/22/23 140 lb (63.5 kg)  01/14/23 147 lb (66.7 kg)  Physical Exam Vitals reviewed.  Constitutional:      General: She is not in acute distress.    Appearance: Normal appearance. She is normal weight. She is not ill-appearing, toxic-appearing or diaphoretic.  HENT:     Head: Normocephalic.  Eyes:     General: No scleral icterus.       Right eye: No discharge.        Left eye: No discharge.     Conjunctiva/sclera: Conjunctivae normal.  Neck:      Comments: Vs bilateral lipomas  Cardiovascular:     Rate and Rhythm: Normal rate and regular rhythm.     Heart sounds: Normal heart sounds.  Pulmonary:     Effort: Pulmonary effort is normal. No respiratory distress.     Breath sounds: Normal breath sounds.  Musculoskeletal:         General: Normal range of motion.     Cervical back: Full passive range of motion without pain.  Lymphadenopathy:     Head:     Right side of head: Submandibular adenopathy present.     Left side of head: Submandibular adenopathy present.     Cervical: Cervical adenopathy present.  Skin:    General: Skin is warm and dry.  Neurological:     General: No focal deficit present.     Mental Status: She is alert and oriented to person, place, and time. Mental status is at baseline.  Psychiatric:        Mood and Affect: Mood normal.        Behavior: Behavior normal.        Thought Content: Thought content normal.        Judgment: Judgment normal.    Lab Results  Component Value Date   HGBA1C 5.4 11/22/2022   HGBA1C 5.7 04/21/2022   HGBA1C 5.9 01/19/2022    Lab Results  Component Value Date   CREATININE 0.73 03/10/2023   CREATININE 0.85 11/22/2022   CREATININE 0.74 04/21/2022    Lab Results  Component Value Date   WBC 7.1 03/10/2023   HGB 13.5 03/10/2023   HCT 40.4 03/10/2023   PLT 238.0 03/10/2023   GLUCOSE 84 03/10/2023   CHOL 191 11/22/2022   TRIG 289.0 (H) 11/22/2022   HDL 59.00 11/22/2022   LDLDIRECT 132.0 04/21/2022   LDLCALC 74 11/22/2022   ALT 44 (H) 03/10/2023   AST 37 03/10/2023   NA 140 03/10/2023   K 4.1 03/10/2023   CL 104 03/10/2023   CREATININE 0.73 03/10/2023   BUN 13 03/10/2023   CO2 27 03/10/2023   TSH 2.50 03/10/2023   HGBA1C 5.4 11/22/2022    No results found.  Assessment & Plan:  .Fatigue, unspecified type Assessment & Plan: HISTORY SUGGESTIVE OF POST VIRAL SYNDROME GIVEN RECENT TREATMENT FOR SINUSITIS/OTITIS,  BUT WILL RULE OUT ANEMIA, B12 DEFICIENCY (GIVEN MACROCYTOSIS NOTED N PRIOR CBC), HEPATITIS, ETC.  Depression screen negative   Orders: -     Comprehensive metabolic panel -     TSH -     CBC with Differential/Platelet -     B12 and Folate Panel  Muscle ache -     CK  LAD (lymphadenopathy), anterior cervical Assessment &  Plan: VERSUS BILATERAL LIPOMAS.  ULTRASOUND OF NECK ORDERED   Orders: -     US SOFT TISSUE HEAD & NECK (NON-THYROID); Future  Abnormal liver function test Assessment & Plan: Recurrent elevations of ALT noted.  Starting workup for transaminitis   Orders: -  ANA; Future -     AntiMicrosomal Ab-Liver / Kidney; Future -     Anti-Smith antibody; Future -     Anti-smooth muscle antibody, IgG; Future -     Hepatic function panel; Future -     Hepatitis B core antibody, total; Future -     Hepatitis B surface antibody,qualitative; Future -     Hepatitis B surface antigen; Future -     IBC + Ferritin; Future -     Mitochondrial antibodies; Future  Weight loss due to medication Assessment & Plan: Has been taking tirzepitide through a weight loss clinic since August .    Alcohol use (HCC) Assessment & Plan: Averages 2 vodka tonics nightly,  encouarged to reduce intake      I provided 30 minutes of face-to-face time during this encounter reviewing patient's last visit with me, patient's  most recent visit with cardiology,  nephrology,  and neurology,  recent surgical and non surgical procedures, previous  labs and imaging studies, counseling on currently addressed issues,  and post visit ordering to diagnostics and therapeutics .   Follow-up: No follow-ups on file.   Sherlene Shams, MD

## 2023-03-10 NOTE — Assessment & Plan Note (Addendum)
Recurrent elevations of ALT noted.  Starting workup for transaminitis

## 2023-03-10 NOTE — Assessment & Plan Note (Signed)
Averages 2 vodka tonics nightly,  encouarged to reduce intake

## 2023-03-10 NOTE — Patient Instructions (Addendum)
Marland Kitchen

## 2023-03-11 ENCOUNTER — Telehealth: Payer: Self-pay

## 2023-03-11 DIAGNOSIS — Z1231 Encounter for screening mammogram for malignant neoplasm of breast: Secondary | ICD-10-CM | POA: Diagnosis not present

## 2023-03-11 LAB — HM MAMMOGRAPHY

## 2023-03-11 NOTE — Telephone Encounter (Signed)
FYI- patient saw you for acute visit. Patient stated that she feels the best today that she has felt in 2 weeks. She saw her b12 level and just wanted you to be aware that she is getting b12 injections with her tirzepatide.

## 2023-03-11 NOTE — Telephone Encounter (Signed)
Pt called to let provider know that he b-12 is running high. She did state that she had a b-12 injection Friday.

## 2023-03-13 ENCOUNTER — Encounter: Payer: Self-pay | Admitting: Internal Medicine

## 2023-03-13 NOTE — Addendum Note (Signed)
Addended by: Sherlene Shams on: 03/13/2023 08:44 PM   Modules accepted: Orders

## 2023-03-14 ENCOUNTER — Encounter: Payer: Self-pay | Admitting: *Deleted

## 2023-03-14 ENCOUNTER — Ambulatory Visit
Admission: RE | Admit: 2023-03-14 | Discharge: 2023-03-14 | Disposition: A | Discharge status: Home / Self Care | Payer: Medicare Other | Source: Ambulatory Visit | Attending: Internal Medicine | Admitting: Internal Medicine

## 2023-03-14 DIAGNOSIS — R2230 Localized swelling, mass and lump, unspecified upper limb: Secondary | ICD-10-CM | POA: Diagnosis not present

## 2023-03-14 DIAGNOSIS — R590 Localized enlarged lymph nodes: Secondary | ICD-10-CM | POA: Diagnosis not present

## 2023-03-14 MED ORDER — TRAZODONE HCL 50 MG PO TABS: ORAL_TABLET | ORAL | 1 refills | Status: DC

## 2023-03-14 NOTE — Addendum Note (Signed)
Addended by: Sandy Salaam on: 03/14/2023 10:41 AM   Modules accepted: Orders

## 2023-03-15 ENCOUNTER — Encounter: Payer: Self-pay | Admitting: Internal Medicine

## 2023-03-20 ENCOUNTER — Encounter: Payer: Self-pay | Admitting: Internal Medicine

## 2023-03-21 DIAGNOSIS — M1712 Unilateral primary osteoarthritis, left knee: Secondary | ICD-10-CM | POA: Diagnosis not present

## 2023-03-21 NOTE — Telephone Encounter (Signed)
 Pt scheduled with Dr Lorin Picket

## 2023-03-22 ENCOUNTER — Ambulatory Visit (INDEPENDENT_AMBULATORY_CARE_PROVIDER_SITE_OTHER): Payer: Medicare Other | Admitting: Internal Medicine

## 2023-03-22 VITALS — BP 112/72 | HR 60 | Temp 98.2°F | Resp 16 | Ht 61.0 in | Wt 142.8 lb

## 2023-03-22 DIAGNOSIS — R0982 Postnasal drip: Secondary | ICD-10-CM

## 2023-03-22 DIAGNOSIS — R7989 Other specified abnormal findings of blood chemistry: Secondary | ICD-10-CM

## 2023-03-22 DIAGNOSIS — E7800 Pure hypercholesterolemia, unspecified: Secondary | ICD-10-CM | POA: Diagnosis not present

## 2023-03-22 DIAGNOSIS — R739 Hyperglycemia, unspecified: Secondary | ICD-10-CM

## 2023-03-22 DIAGNOSIS — K219 Gastro-esophageal reflux disease without esophagitis: Secondary | ICD-10-CM

## 2023-03-22 MED ORDER — PANTOPRAZOLE SODIUM 40 MG PO TBEC: 40.0000 mg | DELAYED_RELEASE_TABLET | Freq: Two times a day (BID) | ORAL | 3 refills | Status: DC

## 2023-03-22 MED ORDER — FLUTICASONE PROPIONATE 50 MCG/ACT NA SUSP: 2.0000 | Freq: Every day | NASAL | 3 refills | Status: DC

## 2023-03-22 NOTE — Progress Notes (Signed)
 Subjective:    [name], female    DOB: 06/12/1950, 73 y.o.   MRN: [id number]  Patient here for  Chief Complaint  Patient presents with   joint pain   swollen glands    HPI Here for work in appt - work in to discuss - "not feeling well".   Initially treated for sinusitis 02/22/23 - augmentin. Revaluated 03/10/23 - fatigue, body aches.Labs obtained. CK negative. She is feeling some better, but still with some increased post nasal drainage. No chest congestion or cough. Did have bad episode of acid reflux within the last few days. Felt more sinus symptoms after this occurred. No vomiting or diarrhea. Still having general body aches. No fever. No rash. Discussed crestor. On questioning, she reports she did not have symptoms prior to starting this medication. Breathing overall stable.    Past Medical History:  Diagnosis Date   Abdominal wall hernia    secondary to large left renal cyst removal   Acute gastritis without hemorrhage    Arthritis    hands   Atrophic vaginitis    Car sickness    Carpal tunnel syndrome    Colon polyps    Complication of anesthesia    slow to wake   Diverticulosis    Fibrocystic breast disease    GERD (gastroesophageal reflux disease)    History of fatty infiltration of liver    Hyperlipidemia    PONV (postoperative nausea and vomiting)    Postmenopausal    Sinusitis    Past Surgical History:  Procedure Laterality Date   ABDOMINAL HYSTERECTOMY  2005   partial   APPENDECTOMY     BLEPHAROPLASTY     Bone Spur     removal - left index finger   BRAVO PH STUDY N/A 06/15/2022   Procedure: BRAVO PH STUDY;  Surgeon: Midge Minium, MD;  Location: [facility] ENDOSCOPY;  Service: Endoscopy;  Laterality: N/A;   CATARACT EXTRACTION, BILATERAL  2000, 2005   COLONOSCOPY WITH PROPOFOL N/A 10/26/2019   Procedure: COLONOSCOPY WITH PROPOFOL;  Surgeon: Midge Minium, MD;  Location: [facility];  Service: Endoscopy;  Laterality: N/A;   COLONOSCOPY WITH  PROPOFOL N/A 06/15/2022   Procedure: COLONOSCOPY WITH PROPOFOL;  Surgeon: Midge Minium, MD;  Location: [facility] ENDOSCOPY;  Service: Endoscopy;  Laterality: N/A;   ESOPHAGOGASTRODUODENOSCOPY (EGD) WITH PROPOFOL N/A 10/26/2019   Procedure: ESOPHAGOGASTRODUODENOSCOPY (EGD) WITH PROPOFOL and Dilation;  Surgeon: Midge Minium, MD;  Location: [facility];  Service: Endoscopy;  Laterality: N/A;   ESOPHAGOGASTRODUODENOSCOPY (EGD) WITH PROPOFOL N/A 06/15/2022   Procedure: ESOPHAGOGASTRODUODENOSCOPY (EGD) WITH PROPOFOL;  Surgeon: Midge Minium, MD;  Location: [facility] ENDOSCOPY;  Service: Endoscopy;  Laterality: N/A;   EXCISION [facility]  [id number]   Dr. Recardo Evangelist   HERNIA REPAIR     lumb hernia     RENAL CYST EXCISION     TUBAL LIGATION     Family History  Problem Relation Age of Onset   Cancer Father        prostate   Lung cancer Other        parent   Hypercholesterolemia Other        parent   Social History   Socioeconomic History   Marital status: Married    Spouse name: Not on file   Number of children: 2   Years of education: Not on file   Highest education level: 12th grade  Occupational History   Not on file  Tobacco Use   Smoking  status: Former   Smokeless tobacco: Never  Vaping Use   Vaping status: Not on file  Substance and Sexual Activity   Alcohol use: Yes    Alcohol/week: 5.0 - 6.0 standard drinks of alcohol    Types: 5 - 6 Glasses of wine per week   Drug use: No   Sexual activity: Not on file  Other Topics Concern   Not on file  Social History Narrative   Not on file   Social Drivers of Health   Financial Resource Strain: Low Risk  (03/09/2023)   Overall Financial Resource Strain (CARDIA)    Difficulty of Paying Living Expenses: Not hard at all  Food Insecurity: No Food Insecurity (03/09/2023)   Hunger Vital Sign    Worried About Running Out of Food in the Last Year: Never true    Ran Out of Food in the Last Year: Never true  Transportation Needs: No  Transportation Needs (03/09/2023)   PRAPARE - Administrator, Civil Service (Medical): No    Lack of Transportation (Non-Medical): No  Physical Activity: Unknown (03/09/2023)   Exercise Vital Sign    Days of Exercise per Week: Patient declined    Minutes of Exercise per Session: 50 min  Stress: No Stress Concern Present (03/09/2023)   [employer] of Occupational Health - Occupational Stress Questionnaire    Feeling of Stress : Only a little  Social Connections: Socially Integrated (03/09/2023)   Social Connection and Isolation Panel [NHANES]    Frequency of Communication with Friends and Family: Twice a week    Frequency of Social Gatherings with Friends and Family: Twice a week    Attends Religious Services: More than 4 times per year    Active Member of [employer] or Organizations: No    Attends Engineer, structural: 1 to 4 times per year    Marital Status: Married     Review of Systems  Constitutional:  Negative for fever and unexpected weight change.  HENT:  Positive for postnasal drip.        Persistent nasal congestion.   Respiratory:  Negative for cough, chest tightness and shortness of breath.   Cardiovascular:  Negative for chest pain and palpitations.  Gastrointestinal:  Negative for abdominal pain, diarrhea and vomiting.       Acid reflux as outlined.   Genitourinary:  Negative for difficulty urinating and dysuria.  Musculoskeletal:        Joint aches/aching.   Skin:  Negative for color change and rash.  Neurological:  Negative for dizziness and headaches.  Psychiatric/Behavioral:  Negative for agitation and dysphoric mood.        Objective:     BP 112/72   Pulse 60   Temp 98.2 F (36.8 C)   Resp 16   Ht 5\' 1"  (1.549 m)   Wt 142 lb 12.8 oz (64.8 kg)   LMP 02/28/1990   SpO2 99%   BMI 26.98 kg/m  Wt Readings from Last 3 Encounters:  03/22/23 142 lb 12.8 oz (64.8 kg)  03/10/23 142 lb 6.4 oz (64.6 kg)  02/22/23 140 lb (63.5 kg)     Physical Exam Vitals reviewed.  Constitutional:      General: She is not in acute distress.    Appearance: Normal appearance.  HENT:     Head: Normocephalic and atraumatic.     Right Ear: External ear normal.     Left Ear: External ear normal.  Eyes:     General: No  scleral icterus.       Right eye: No discharge.        Left eye: No discharge.     Conjunctiva/sclera: Conjunctivae normal.  Neck:     Thyroid: No thyromegaly.  Cardiovascular:     Rate and Rhythm: Normal rate and regular rhythm.  Pulmonary:     Effort: No respiratory distress.     Breath sounds: Normal breath sounds. No wheezing.  Abdominal:     General: Bowel sounds are normal.     Palpations: Abdomen is soft.     Tenderness: There is no abdominal tenderness.  Musculoskeletal:        General: No swelling or tenderness.     Cervical back: Neck supple. No tenderness.  Lymphadenopathy:     Cervical: No cervical adenopathy.  Skin:    Findings: No erythema or rash.  Neurological:     Mental Status: She is alert.  Psychiatric:        Mood and Affect: Mood normal.        Behavior: Behavior normal.         Outpatient Encounter Medications as of 03/22/2023  Medication Sig   fluticasone (FLONASE) 50 MCG/ACT nasal spray Place 2 sprays into both nostrils daily.   ALPRAZolam (XANAX) 0.25 MG tablet Take 1 tablet (0.25 mg total) by mouth daily as needed.   fexofenadine (ALLEGRA ALLERGY) 180 MG tablet Take 1 tablet (180 mg total) by mouth daily.   pantoprazole (PROTONIX) 40 MG tablet Take 1 tablet (40 mg total) by mouth 2 (two) times daily before a meal.   rosuvastatin (CRESTOR) 5 MG tablet TAKE 1 TABLET BY MOUTH DAILY   sertraline (ZOLOFT) 50 MG tablet TAKE 1 TABLET BY MOUTH DAILY   traZODone (DESYREL) 50 MG tablet 1/2 tablet q hs   [DISCONTINUED] pantoprazole (PROTONIX) 40 MG tablet Take 1 tablet (40 mg total) by mouth daily.   No facility-administered encounter medications on file as of 03/22/2023.      Lab Results  Component Value Date   WBC 7.1 03/10/2023   HGB 13.5 03/10/2023   HCT 40.4 03/10/2023   PLT 238.0 03/10/2023   GLUCOSE 84 03/10/2023   CHOL 191 11/22/2022   TRIG 289.0 (H) 11/22/2022   HDL 59.00 11/22/2022   LDLDIRECT 132.0 04/21/2022   LDLCALC 74 11/22/2022   ALT 44 (H) 03/10/2023   AST 37 03/10/2023   NA 140 03/10/2023   K 4.1 03/10/2023   CL 104 03/10/2023   CREATININE 0.73 03/10/2023   BUN 13 03/10/2023   CO2 27 03/10/2023   TSH 2.50 03/10/2023   HGBA1C 5.4 11/22/2022    US Soft Tissue Head/Neck (NON-THYROID) Result Date: 03/14/2023 CLINICAL DATA:  Palpable abnormality involving the supraclavicular fossa bilaterally. EXAM: ULTRASOUND OF HEAD/NECK SOFT TISSUES TECHNIQUE: Ultrasound examination of the head and neck soft tissues was performed in the area of clinical concern. COMPARISON:  None Available. FINDINGS: Sonographic evaluation of the patient's palpable area of concern involving the superior supraclavicular fossa bilaterally are negative for sonographic correlate. Specifically, no discrete solid or cystic lesions. No regional supraclavicular lymphadenopathy. IMPRESSION: No sonographic correlate for patient's palpable area of concern involving the supraclavicular fossa bilaterally. Electronically Signed   By: Simonne Come M.D.   On: 03/14/2023 16:04       Assessment & Plan:  Abnormal liver function test Assessment & Plan: Diet and exercise. Follow liver function tests.  Have discussed the need to stop/decrease alcohol intake.    Hyperglycemia Assessment & Plan: Follow  met b and a1c.    Hypercholesterolemia Assessment & Plan: On crestor. Discussed persistent aching. Discussed possible side effect. Will hold crestor temporarily. Call with update over the next 1-2 weeks.  Will address further treatment for her cholesterol at that time.    Gastroesophageal reflux disease, unspecified whether esophagitis present Assessment & Plan:  Saw Dr Servando Snare 07/27/22  - f/u - after Bravo pH study. S/p dilation of esophageal stricture on recent EGD. PPI helps control acid reflux. Currently on protonix. Given increased reflux recently will increase protonix to bid as directed.  Follow.  Call with update.    Post-nasal drainage Assessment & Plan: Persistent nasal congestion/post nasal drainage. Treat acid reflux as outlined.  Flonase nasal spray as directed.  Follow.    Other orders -     Fluticasone Propionate; Place 2 sprays into both nostrils daily.  Dispense: 16 g; Refill: 3 -     Pantoprazole Sodium; Take 1 tablet (40 mg total) by mouth 2 (two) times daily before a meal.  Dispense: 60 tablet; Refill: 3     Dale Ellport, MD

## 2023-03-22 NOTE — Assessment & Plan Note (Signed)
Diet and exercise. Follow liver function tests.  Have discussed the need to stop/decrease alcohol intake.

## 2023-03-22 NOTE — Patient Instructions (Signed)
Stop crestor.   Increase protonix to twice a day - take protonix 30 minutes before breakfast and 30 minutes before evening meal.   Flonase nasal spray - 2 sprays each nostril one time per day. Do this in the evening.

## 2023-03-28 ENCOUNTER — Encounter: Payer: Self-pay | Admitting: Internal Medicine

## 2023-03-28 ENCOUNTER — Other Ambulatory Visit: Payer: Medicare Other

## 2023-03-28 DIAGNOSIS — Z86018 Personal history of other benign neoplasm: Secondary | ICD-10-CM | POA: Diagnosis not present

## 2023-03-28 DIAGNOSIS — R0982 Postnasal drip: Secondary | ICD-10-CM | POA: Insufficient documentation

## 2023-03-28 NOTE — Assessment & Plan Note (Signed)
 Saw Dr Servando Snare 07/27/22 - f/u - after Bravo pH study. S/p dilation of esophageal stricture on recent EGD. PPI helps control acid reflux. Currently on protonix. Given increased reflux recently will increase protonix to bid as directed.  Follow.  Call with update.

## 2023-03-28 NOTE — Assessment & Plan Note (Signed)
 Persistent nasal congestion/post nasal drainage. Treat acid reflux as outlined.  Flonase nasal spray as directed.  Follow.

## 2023-03-28 NOTE — Assessment & Plan Note (Signed)
 Follow met b and a1c.

## 2023-03-28 NOTE — Assessment & Plan Note (Signed)
 On crestor. Discussed persistent aching. Discussed possible side effect. Will hold crestor temporarily. Call with update over the next 1-2 weeks.  Will address further treatment for her cholesterol at that time.

## 2023-04-04 ENCOUNTER — Encounter: Payer: Self-pay | Admitting: Internal Medicine

## 2023-04-04 NOTE — Telephone Encounter (Signed)
 Please call her and thank her for the update. I am glad she is feeling better. Notify her to let me know if symptoms persists or worsen.

## 2023-04-11 ENCOUNTER — Encounter: Payer: Self-pay | Admitting: Internal Medicine

## 2023-04-18 DIAGNOSIS — M1712 Unilateral primary osteoarthritis, left knee: Secondary | ICD-10-CM | POA: Diagnosis not present

## 2023-04-18 DIAGNOSIS — M25462 Effusion, left knee: Secondary | ICD-10-CM | POA: Diagnosis not present

## 2023-04-20 ENCOUNTER — Other Ambulatory Visit: Payer: Self-pay

## 2023-04-24 NOTE — Progress Notes (Unsigned)
 Celso Amy, PA-C [address]  [address]  [place], [place] [address]  Main: [phone]  Fax: [phone]   Primary Care Physician: Dale Casas Adobes, MD  Primary Gastroenterologist:  Celso Amy, PA-C / Dr. Midge Minium    CC: F/U GERD, Belching  HPI: Kara Burns is a 73 y.o. female presents for evaluation of GERD and belching.  She last saw Dr. Servando Snare in our office 07/2022 for GERD and Belching.  She was started on Protonix 40 Mg once daily.  Symptoms improved and returned.  1 month ago Protonix was increased to 40 mg twice daily by her PCP.  She still has belching and occasional indigestion.  She was drinking a chocolate shake last week and had increased GERD symptoms.  She drinks alcoholic cocktail every night.  Admits to consuming GERD trigger foods, such as soda.  Denies dysphagia, abdominal pain, or other GI symptoms.  Bravo pH study showed mild reflux associated with burping and heartburn.  Patient restarted Protonix 40 Mg daily.   She has had intermittent chronic belching, worse in the morning.   06/2022 EGD: Mild benign distal esophageal stenosis dilated to 18 mm.  Bravo capsule was deployed.  Normal stomach and duodenum.  No biopsies.  06/2022 colonoscopy by Dr. Servando Snare: 3 mm tubular adenoma polyp removed, diverticulosis, excellent prep.  Biopsies negative for microscopic colitis.  7-year repeat.  She has had mildly elevated ALT liver transaminase for several years.  All other LFTs normal.  Admits to daily alcohol use.  1 cocktail every evening.  Current Outpatient Medications  Medication Sig Dispense Refill   ALPRAZolam (XANAX) 0.25 MG tablet Take 1 tablet (0.25 mg total) by mouth daily as needed. 30 tablet 0   esomeprazole (NEXIUM) 40 MG capsule Take 1 capsule (40 mg total) by mouth 2 (two) times daily before a meal. 60 capsule 11   fluticasone (FLONASE) 50 MCG/ACT nasal spray Place 2 sprays into both nostrils daily. 16 g 3   sertraline (ZOLOFT) 50 MG tablet TAKE 1  TABLET BY MOUTH DAILY 90 tablet 1   traZODone (DESYREL) 50 MG tablet 1/2 tablet q hs 30 tablet 1   fexofenadine (ALLEGRA ALLERGY) 180 MG tablet Take 1 tablet (180 mg total) by mouth daily. 90 tablet 1   rosuvastatin (CRESTOR) 5 MG tablet TAKE 1 TABLET BY MOUTH DAILY 90 tablet 1   valACYclovir (VALTREX) 500 MG tablet Take 500 mg by mouth 2 (two) times daily.     No current facility-administered medications for this visit.    Allergies as of 04/25/2023 - Review Complete 04/25/2023  Allergen Reaction Noted   Erythromycin Diarrhea 02/29/2012   Erythromycin base  10/07/2010   Erythromycin ethylsuccinate  02/29/2012    Past Medical History:  Diagnosis Date   Abdominal wall hernia    secondary to large left renal cyst removal   Acute gastritis without hemorrhage    Arthritis    hands   Atrophic vaginitis    Car sickness    Carpal tunnel syndrome    Colon polyps    Complication of anesthesia    slow to wake   Diverticulosis    Fibrocystic breast disease    GERD (gastroesophageal reflux disease)    History of fatty infiltration of liver    Hyperlipidemia    PONV (postoperative nausea and vomiting)    Postmenopausal    Sinusitis     Past Surgical History:  Procedure Laterality Date   ABDOMINAL HYSTERECTOMY  2005   partial  APPENDECTOMY     BLEPHAROPLASTY     Bone Spur     removal - left index finger   BRAVO PH STUDY N/A 06/15/2022   Procedure: BRAVO PH STUDY;  Surgeon: Midge Minium, MD;  Location: [facility] ENDOSCOPY;  Service: Endoscopy;  Laterality: N/A;   CATARACT EXTRACTION, BILATERAL  2000, 2005   COLONOSCOPY WITH PROPOFOL N/A 10/26/2019   Procedure: COLONOSCOPY WITH PROPOFOL;  Surgeon: Midge Minium, MD;  Location: [facility];  Service: Endoscopy;  Laterality: N/A;   COLONOSCOPY WITH PROPOFOL N/A 06/15/2022   Procedure: COLONOSCOPY WITH PROPOFOL;  Surgeon: Midge Minium, MD;  Location: [facility] ENDOSCOPY;  Service: Endoscopy;  Laterality: N/A;   ESOPHAGOGASTRODUODENOSCOPY  (EGD) WITH PROPOFOL N/A 10/26/2019   Procedure: ESOPHAGOGASTRODUODENOSCOPY (EGD) WITH PROPOFOL and Dilation;  Surgeon: Midge Minium, MD;  Location: [facility];  Service: Endoscopy;  Laterality: N/A;   ESOPHAGOGASTRODUODENOSCOPY (EGD) WITH PROPOFOL N/A 06/15/2022   Procedure: ESOPHAGOGASTRODUODENOSCOPY (EGD) WITH PROPOFOL;  Surgeon: Midge Minium, MD;  Location: [facility] ENDOSCOPY;  Service: Endoscopy;  Laterality: N/A;   EXCISION [facility]  [id number]   Dr. Recardo Evangelist   HERNIA REPAIR     lumb hernia     RENAL CYST EXCISION     TUBAL LIGATION      Review of Systems:    All systems reviewed and negative except where noted in HPI.   Physical Examination:   BP 114/66   Pulse 81   Temp 97.8 F (36.6 C)   Ht 5\' 1"  (1.549 m)   Wt 140 lb 6.4 oz (63.7 kg)   LMP 02/28/1990   BMI 26.53 kg/m   General: Well-nourished, well-developed in no acute distress.  Lungs: Clear to auscultation bilaterally. Non-labored. Heart: Regular rate and rhythm, no murmurs rubs or gallops.  Abdomen: Bowel sounds are normal; Abdomen is Soft; No hepatosplenomegaly, masses or hernias;  No Abdominal Tenderness; No guarding or rebound tenderness. Neuro: Alert and oriented x 3.  Grossly intact.  Psych: Alert and cooperative, normal mood and affect.   Imaging Studies: No results found.  Assessment and Plan:   Kara Burns is a 73 y.o. y/o female returns for follow-up of:  1.  GERD  Stop Protonix 40 Mg twice daily.  Start Rx Nexium 40 Mg twice daily.  Add OTC Pepcid 20 Mg twice daily or antacid if needed.  Recommend Lifestyle Modifications to prevent Acid Reflux.  Rec. Avoid coffee, sodas, peppermint, garlic, onions, alcohol, citrus fruits, chocolate, tomatoes, fatty and spicey foods.  Avoid eating 2-3 hours before bedtime.  Recommend lowest effective PPI dose necessary. If Nexium does not work, then consider Dexilant or Voquezna.  2.  History of adenomatous colon polyp  7-year repeat  colonoscopy will be due 06/2029.  3.  Mildly elevated ALT liver transaminase; all other LFTs normal.  Recommend stop alcohol use.  She will continue to follow-up with her PCP to monitor labs.  Celso Amy, PA-C  Follow up if symptoms worsen or fail to improve.

## 2023-04-25 ENCOUNTER — Ambulatory Visit: Admitting: Physician Assistant

## 2023-04-25 ENCOUNTER — Encounter: Payer: Self-pay | Admitting: Physician Assistant

## 2023-04-25 VITALS — BP 114/66 | HR 81 | Temp 97.8°F | Ht 61.0 in | Wt 140.4 lb

## 2023-04-25 DIAGNOSIS — K219 Gastro-esophageal reflux disease without esophagitis: Secondary | ICD-10-CM

## 2023-04-25 DIAGNOSIS — R142 Eructation: Secondary | ICD-10-CM

## 2023-04-25 DIAGNOSIS — R7401 Elevation of levels of liver transaminase levels: Secondary | ICD-10-CM | POA: Diagnosis not present

## 2023-04-25 DIAGNOSIS — Z860101 Personal history of adenomatous and serrated colon polyps: Secondary | ICD-10-CM | POA: Diagnosis not present

## 2023-04-25 MED ORDER — ESOMEPRAZOLE MAGNESIUM 40 MG PO CPDR: 40.0000 mg | DELAYED_RELEASE_CAPSULE | Freq: Two times a day (BID) | ORAL | 11 refills | Status: AC

## 2023-04-27 ENCOUNTER — Telehealth: Payer: Self-pay

## 2023-04-27 NOTE — Telephone Encounter (Signed)
 Submitted PA through cover my meds for Nexium 40mg  twice a day. Waiting on response from insurance company

## 2023-04-27 NOTE — Telephone Encounter (Signed)
 [employer] called and medication has been approved 04/27/2023  to 04/26/2024. They have notified the member also. They will fax [place] the information also

## 2023-04-29 ENCOUNTER — Other Ambulatory Visit: Payer: Self-pay

## 2023-04-29 DIAGNOSIS — R739 Hyperglycemia, unspecified: Secondary | ICD-10-CM

## 2023-04-29 DIAGNOSIS — E7800 Pure hypercholesterolemia, unspecified: Secondary | ICD-10-CM

## 2023-05-05 ENCOUNTER — Other Ambulatory Visit (INDEPENDENT_AMBULATORY_CARE_PROVIDER_SITE_OTHER): Payer: Medicare Other

## 2023-05-05 DIAGNOSIS — E7800 Pure hypercholesterolemia, unspecified: Secondary | ICD-10-CM

## 2023-05-05 DIAGNOSIS — R739 Hyperglycemia, unspecified: Secondary | ICD-10-CM

## 2023-05-05 LAB — BASIC METABOLIC PANEL WITH GFR
BUN: 11 mg/dL (ref 6–23)
CO2: 26 meq/L (ref 19–32)
Calcium: 9.4 mg/dL (ref 8.4–10.5)
Chloride: 106 meq/L (ref 96–112)
Creatinine, Ser: 0.79 mg/dL (ref 0.40–1.20)
GFR: 75.16 mL/min (ref >60.00)
Glucose, Bld: 87 mg/dL (ref 70–99)
Potassium: 4.2 meq/L (ref 3.5–5.1)
Sodium: 141 meq/L (ref 135–145)

## 2023-05-05 LAB — HEMOGLOBIN A1C: Hgb A1c MFr Bld: 5.3 % (ref 4.6–6.5)

## 2023-05-05 LAB — LIPID PANEL
Cholesterol: 195 mg/dL (ref 0–200)
HDL: 45.1 mg/dL (ref >39.00)
LDL Cholesterol: 99 mg/dL (ref 0–99)
NonHDL: 150.02
Total CHOL/HDL Ratio: 4
Triglycerides: 253 mg/dL — ABNORMAL HIGH (ref 0.0–149.0)
VLDL: 50.6 mg/dL — ABNORMAL HIGH (ref 0.0–40.0)

## 2023-05-05 LAB — HEPATIC FUNCTION PANEL
ALT: 28 U/L (ref 0–35)
AST: 22 U/L (ref 0–37)
Albumin: 4.3 g/dL (ref 3.5–5.2)
Alkaline Phosphatase: 62 U/L (ref 39–117)
Bilirubin, Direct: 0.1 mg/dL (ref 0.0–0.3)
Total Bilirubin: 0.5 mg/dL (ref 0.2–1.2)
Total Protein: 6.5 g/dL (ref 6.0–8.3)

## 2023-05-09 ENCOUNTER — Encounter: Payer: Self-pay | Admitting: Internal Medicine

## 2023-05-09 ENCOUNTER — Ambulatory Visit: Payer: Medicare Other | Admitting: Internal Medicine

## 2023-05-09 VITALS — BP 108/64 | HR 64 | Temp 98.0°F | Resp 16 | Ht 61.0 in | Wt 141.8 lb

## 2023-05-09 DIAGNOSIS — Z136 Encounter for screening for cardiovascular disorders: Secondary | ICD-10-CM

## 2023-05-09 DIAGNOSIS — Z0000 Encounter for general adult medical examination without abnormal findings: Secondary | ICD-10-CM

## 2023-05-09 DIAGNOSIS — K219 Gastro-esophageal reflux disease without esophagitis: Secondary | ICD-10-CM

## 2023-05-09 DIAGNOSIS — N281 Cyst of kidney, acquired: Secondary | ICD-10-CM

## 2023-05-09 DIAGNOSIS — E7800 Pure hypercholesterolemia, unspecified: Secondary | ICD-10-CM

## 2023-05-09 DIAGNOSIS — M25569 Pain in unspecified knee: Secondary | ICD-10-CM

## 2023-05-09 DIAGNOSIS — R739 Hyperglycemia, unspecified: Secondary | ICD-10-CM

## 2023-05-09 DIAGNOSIS — R7989 Other specified abnormal findings of blood chemistry: Secondary | ICD-10-CM

## 2023-05-09 DIAGNOSIS — Z789 Other specified health status: Secondary | ICD-10-CM | POA: Diagnosis not present

## 2023-05-09 DIAGNOSIS — F1090 Alcohol use, unspecified, uncomplicated: Secondary | ICD-10-CM

## 2023-05-09 MED ORDER — PRAVASTATIN SODIUM 10 MG PO TABS: ORAL_TABLET | ORAL | 1 refills | Status: DC

## 2023-05-09 MED ORDER — TRAZODONE HCL 50 MG PO TABS: ORAL_TABLET | ORAL | 1 refills | Status: DC

## 2023-05-09 NOTE — Assessment & Plan Note (Addendum)
 Physical today -05/09/23. Mammogram 03/11/23 - Birads I ([facility]).  Colonoscopy 10/26/19 - pathology - tubular adenoma.  Recommended f/u colonoscopy in 7 years.

## 2023-05-09 NOTE — Progress Notes (Signed)
 Subjective:    [name], female    DOB: 1950-12-16, 73 y.o.   MRN: [id number]  Patient here for  Chief Complaint  Patient presents with   Annual Exam    HPI Here for a physical exam. Had f/u with GI - 04/25/23 - f/u GERD and belching. Had been on protonix 40mg  daily. Persistent symptoms - protonix changed to nexium. Also lifestyle modifications to prevent acid reflux.per note, recommended stopping alcohol use - given elevated ALT.  Saw Dr Odis Luster 01/17/23 - f/u left knee. S/p aspiration and kenalog injection. S/p aspiration 03/2023 and f/u 04/18/23 - persistent pain. S/p steroid injection. Knee - better after aspiration and injection. Last visit, crestor stopped secondary to intolerance. Discussed cholesterol and calculated cholesterol risk. Discussed other treatment options. Agreeable to start pravastatin three days per week. Noticed some sore throat and ear discomfort. Discussed using flonase regularly. No sob reported. No chest pain. Acid reflux better.    Past Medical History:  Diagnosis Date   Abdominal wall hernia    secondary to large left renal cyst removal   Acute gastritis without hemorrhage    Arthritis    hands   Atrophic vaginitis    Car sickness    Carpal tunnel syndrome    Colon polyps    Complication of anesthesia    slow to wake   Diverticulosis    Fibrocystic breast disease    GERD (gastroesophageal reflux disease)    History of fatty infiltration of liver    Hyperlipidemia    PONV (postoperative nausea and vomiting)    Postmenopausal    Sinusitis    Past Surgical History:  Procedure Laterality Date   ABDOMINAL HYSTERECTOMY  2005   partial   APPENDECTOMY     BLEPHAROPLASTY     Bone Spur     removal - left index finger   BRAVO PH STUDY N/A 06/15/2022   Procedure: BRAVO PH STUDY;  Surgeon: Midge Minium, MD;  Location: [facility];  Service: Endoscopy;  Laterality: N/A;   CATARACT EXTRACTION, BILATERAL  2000, 2005   COLONOSCOPY WITH  PROPOFOL N/A 10/26/2019   Procedure: COLONOSCOPY WITH PROPOFOL;  Surgeon: Midge Minium, MD;  Location: [facility];  Service: Endoscopy;  Laterality: N/A;   COLONOSCOPY WITH PROPOFOL N/A 06/15/2022   Procedure: COLONOSCOPY WITH PROPOFOL;  Surgeon: Midge Minium, MD;  Location: [facility];  Service: Endoscopy;  Laterality: N/A;   ESOPHAGOGASTRODUODENOSCOPY (EGD) WITH PROPOFOL N/A 10/26/2019   Procedure: ESOPHAGOGASTRODUODENOSCOPY (EGD) WITH PROPOFOL and Dilation;  Surgeon: Midge Minium, MD;  Location: [facility];  Service: Endoscopy;  Laterality: N/A;   ESOPHAGOGASTRODUODENOSCOPY (EGD) WITH PROPOFOL N/A 06/15/2022   Procedure: ESOPHAGOGASTRODUODENOSCOPY (EGD) WITH PROPOFOL;  Surgeon: Midge Minium, MD;  Location: [facility];  Service: Endoscopy;  Laterality: N/A;   EXCISION [facility]  [id number]   Dr. Recardo Evangelist   HERNIA REPAIR     lumb hernia     RENAL CYST EXCISION     TUBAL LIGATION     Family History  Problem Relation Age of Onset   Cancer Father        prostate   Lung cancer Other        parent   Hypercholesterolemia Other        parent   Social History   Socioeconomic History   Marital status: Married    Spouse name: Not on file   Number of children: 2   Years of education: Not on file   Highest  education level: 12th grade  Occupational History   Not on file  Tobacco Use   Smoking status: Former   Smokeless tobacco: Never  Vaping Use   Vaping status: Not on file  Substance and Sexual Activity   Alcohol use: Yes    Alcohol/week: 5.0 - 6.0 standard drinks of alcohol    Types: 5 - 6 Glasses of wine per week   Drug use: No   Sexual activity: Not on file  Other Topics Concern   Not on file  Social History Narrative   Not on file   Social Drivers of Health   Financial Resource Strain: Low Risk  (03/09/2023)   Overall Financial Resource Strain (CARDIA)    Difficulty of Paying Living Expenses: Not hard at all  Food Insecurity: No Food  Insecurity (03/09/2023)   Hunger Vital Sign    Worried About Running Out of Food in the Last Year: Never true    Ran Out of Food in the Last Year: Never true  Transportation Needs: No Transportation Needs (03/09/2023)   PRAPARE - Administrator, Civil Service (Medical): No    Lack of Transportation (Non-Medical): No  Physical Activity: Unknown (03/09/2023)   Exercise Vital Sign    Days of Exercise per Week: Patient declined    Minutes of Exercise per Session: Not on file  Stress: No Stress Concern Present (03/09/2023)   [employer] of Occupational Health - Occupational Stress Questionnaire    Feeling of Stress : Only a little  Social Connections: Moderately Integrated (03/09/2023)   Social Connection and Isolation Panel [NHANES]    Frequency of Communication with Friends and Family: Twice a week    Frequency of Social Gatherings with Friends and Family: Twice a week    Attends Religious Services: More than 4 times per year    Active Member of [employer] or Organizations: No    Attends Engineer, structural: Not on file    Marital Status: Married     Review of Systems  Constitutional:  Negative for appetite change and unexpected weight change.  HENT:  Negative for sinus pressure and sore throat.        Sore throat and ear issues as outlined.   Eyes:  Negative for pain and visual disturbance.  Respiratory:  Negative for cough, chest tightness and shortness of breath.   Cardiovascular:  Negative for chest pain, palpitations and leg swelling.  Gastrointestinal:  Negative for abdominal pain, diarrhea, nausea and vomiting.       Acid reflux better.   Genitourinary:  Negative for difficulty urinating and dysuria.  Musculoskeletal:  Negative for joint swelling and myalgias.       Left knee issues as outlined. Appears to be doing better currently.   Skin:  Negative for color change and rash.  Neurological:  Negative for dizziness and headaches.  Hematological:  Negative  for adenopathy. Does not bruise/bleed easily.  Psychiatric/Behavioral:  Negative for agitation and dysphoric mood.        Objective:     BP 108/64   Pulse 64   Temp 98 F (36.7 C)   Resp 16   Ht 5\' 1"  (1.549 m)   Wt 141 lb 12.8 oz (64.3 kg)   LMP 02/28/1990   SpO2 98%   BMI 26.79 kg/m  Wt Readings from Last 3 Encounters:  05/09/23 141 lb 12.8 oz (64.3 kg)  04/25/23 140 lb 6.4 oz (63.7 kg)  03/22/23 142 lb 12.8 oz (64.8 kg)  Physical Exam Vitals reviewed.  Constitutional:      General: She is not in acute distress.    Appearance: Normal appearance. She is well-developed.  HENT:     Head: Normocephalic and atraumatic.     Right Ear: External ear normal.     Left Ear: External ear normal.     Mouth/Throat:     Pharynx: No oropharyngeal exudate or posterior oropharyngeal erythema.  Eyes:     General: No scleral icterus.       Right eye: No discharge.        Left eye: No discharge.     Conjunctiva/sclera: Conjunctivae normal.  Neck:     Thyroid: No thyromegaly.  Cardiovascular:     Rate and Rhythm: Normal rate and regular rhythm.  Pulmonary:     Effort: No tachypnea, accessory muscle usage or respiratory distress.     Breath sounds: Normal breath sounds. No decreased breath sounds or wheezing.  Chest:  Breasts:    Right: No inverted nipple, mass, nipple discharge or tenderness (no axillary adenopathy).     Left: No inverted nipple, mass, nipple discharge or tenderness (no axilarry adenopathy).  Abdominal:     General: Bowel sounds are normal.     Palpations: Abdomen is soft.     Tenderness: There is no abdominal tenderness.  Musculoskeletal:        General: No swelling or tenderness.     Cervical back: Neck supple.  Lymphadenopathy:     Cervical: No cervical adenopathy.  Skin:    Findings: No erythema or rash.  Neurological:     Mental Status: She is alert and oriented to person, place, and time.  Psychiatric:        Mood and Affect: Mood normal.         Behavior: Behavior normal.         Outpatient Encounter Medications as of 05/09/2023  Medication Sig   ALPRAZolam (XANAX) 0.25 MG tablet Take 1 tablet (0.25 mg total) by mouth daily as needed.   esomeprazole (NEXIUM) 40 MG capsule Take 1 capsule (40 mg total) by mouth 2 (two) times daily before a meal.   fluticasone (FLONASE) 50 MCG/ACT nasal spray Place 2 sprays into both nostrils daily.   pravastatin (PRAVACHOL) 10 MG tablet Take one tablet three times per week.   sertraline (ZOLOFT) 50 MG tablet TAKE 1 TABLET BY MOUTH DAILY   [DISCONTINUED] traZODone (DESYREL) 50 MG tablet 1/2 tablet q hs   traZODone (DESYREL) 50 MG tablet 1/2 tablet q hs   No facility-administered encounter medications on file as of 05/09/2023.     Lab Results  Component Value Date   WBC 7.1 03/10/2023   HGB 13.5 03/10/2023   HCT 40.4 03/10/2023   PLT 238.0 03/10/2023   GLUCOSE 87 05/05/2023   CHOL 195 05/05/2023   TRIG 253.0 (H) 05/05/2023   HDL 45.10 05/05/2023   LDLDIRECT 132.0 04/21/2022   LDLCALC 99 05/05/2023   ALT 28 05/05/2023   AST 22 05/05/2023   NA 141 05/05/2023   K 4.2 05/05/2023   CL 106 05/05/2023   CREATININE 0.79 05/05/2023   BUN 11 05/05/2023   CO2 26 05/05/2023   TSH 2.50 03/10/2023   HGBA1C 5.3 05/05/2023    US Soft Tissue Head/Neck (NON-THYROID) Result Date: 03/14/2023 CLINICAL DATA:  Palpable abnormality involving the supraclavicular fossa bilaterally. EXAM: ULTRASOUND OF HEAD/NECK SOFT TISSUES TECHNIQUE: Ultrasound examination of the head and neck soft tissues was performed in the area of clinical  concern. COMPARISON:  None Available. FINDINGS: Sonographic evaluation of the patient's palpable area of concern involving the superior supraclavicular fossa bilaterally are negative for sonographic correlate. Specifically, no discrete solid or cystic lesions. No regional supraclavicular lymphadenopathy. IMPRESSION: No sonographic correlate for patient's palpable area of concern involving  the supraclavicular fossa bilaterally. Electronically Signed   By: Simonne Come M.D.   On: 03/14/2023 16:04       Assessment & Plan:  Routine general medical examination at a health care facility  Hyperglycemia Assessment & Plan: Follow met b and a1c.   Orders: -     Hemoglobin A1c; Future  Hypercholesterolemia Assessment & Plan: Was on crestor. Had persistent aching. Crestor stopped. Better off. Discussed calculated cholesterol risk and other treatment options. Agreeable to start pravastatin three days per week.  The 10-year ASCVD risk score (Arnett DK, et al., 2019) is: 7.9%   Values used to calculate the score:     Age: 58 years     Sex: Female     Is Non-Hispanic African American: No     Diabetic: No     Tobacco smoker: No     Systolic Blood Pressure: 108 mmHg     Is BP treated: No     HDL Cholesterol: 45.1 mg/dL     Total Cholesterol: 195 mg/dL   Orders: -     Basic metabolic panel with GFR; Future -     Lipid panel; Future -     Hepatic function panel; Future -     Hepatic function panel; Future  Health care maintenance Assessment & Plan: Physical today -05/09/23. Mammogram 03/11/23 - Bir[facility] I ([facility]).  Colonoscopy 10/26/19 - pathology - tubular adenoma.  Recommended f/u colonoscopy in 7 years.     Encounter for screening for coronary artery disease Assessment & Plan: Discussed CT calcium score. Agreeable.   Orders: -     CT CARDIAC SCORING (SELF PAY ONLY); Future  Abnormal liver function test Assessment & Plan: Recent liver panel wnl. Continue diet and exercise. Continue decreased alcohol intake.    Alcohol use (HCC) Assessment & Plan: Discussed the need to continue decreased alcohol intake.    Gastroesophageal reflux disease, unspecified whether esophagitis present Assessment & Plan:  Saw Dr Servando Snare 07/27/22 - f/u - after Bravo pH study. S/p dilation of esophageal stricture on recent EGD. PPI helps control acid reflux. Currently on nexium. Acid reflux doing  better. Follow.    Knee pain, unspecified chronicity, unspecified laterality Assessment & Plan: Saw Dr Odis Luster 01/17/23 - f/u left knee. S/p aspiration and kenalog injection. S/p aspiration 03/2023 and f/u 04/18/23 - persistent pain. S/p steroid injection. Knee - better after aspiration and injection.   Renal cyst Assessment & Plan: Dr Apolinar Junes (10/2021) - Bilateral renal cyst of varying complexity, primarily Bosniak 1 and 2.  Recommend f/u renal ultrasound in 2 years.    Other orders -     traZODone HCl; 1/2 tablet q hs  Dispense: 30 tablet; Refill: 1 -     Pravastatin Sodium; Take one tablet three times per week.  Dispense: 39 tablet; Refill: 1     Dale Nespelem, MD

## 2023-05-09 NOTE — Assessment & Plan Note (Addendum)
 Was on crestor. Had persistent aching. Crestor stopped. Better off. Discussed calculated cholesterol risk and other treatment options. Agreeable to start pravastatin three days per week.  The 10-year ASCVD risk score (Arnett DK, et al., 2019) is: 7.9%   Values used to calculate the score:     Age: 73 years     Sex: Female     Is Non-Hispanic African American: No     Diabetic: No     Tobacco smoker: No     Systolic Blood Pressure: 108 mmHg     Is BP treated: No     HDL Cholesterol: 45.1 mg/dL     Total Cholesterol: 195 mg/dL

## 2023-05-14 ENCOUNTER — Encounter: Payer: Self-pay | Admitting: Internal Medicine

## 2023-05-15 ENCOUNTER — Encounter: Payer: Self-pay | Admitting: Internal Medicine

## 2023-05-15 DIAGNOSIS — Z136 Encounter for screening for cardiovascular disorders: Secondary | ICD-10-CM | POA: Insufficient documentation

## 2023-05-15 NOTE — Assessment & Plan Note (Signed)
 Follow met b and a1c.

## 2023-05-15 NOTE — Assessment & Plan Note (Signed)
 Saw Dr Odis Luster 01/17/23 - f/u left knee. S/p aspiration and kenalog injection. S/p aspiration 03/2023 and f/u 04/18/23 - persistent pain. S/p steroid injection. Knee - better after aspiration and injection.

## 2023-05-15 NOTE — Assessment & Plan Note (Signed)
Dr Brandon (10/2021) - Bilateral renal cyst of varying complexity, primarily Bosniak 1 and 2.  Recommend f/u renal ultrasound in 2 years.  

## 2023-05-15 NOTE — Assessment & Plan Note (Signed)
Discussed CT calcium score.  Agreeable.

## 2023-05-15 NOTE — Assessment & Plan Note (Signed)
 Recent liver panel wnl. Continue diet and exercise. Continue decreased alcohol intake.

## 2023-05-15 NOTE — Assessment & Plan Note (Signed)
 Saw Dr Servando Snare 07/27/22 - f/u - after Bravo pH study. S/p dilation of esophageal stricture on recent EGD. PPI helps control acid reflux. Currently on nexium. Acid reflux doing better. Follow.

## 2023-05-15 NOTE — Assessment & Plan Note (Signed)
 Discussed the need to continue decreased alcohol intake.

## 2023-05-16 NOTE — Telephone Encounter (Signed)
 Pt has been scheduled for next week. Unable to come later this week due to being out of town.

## 2023-05-16 NOTE — Telephone Encounter (Signed)
 Needs an appt. To discuss to determine best test.

## 2023-05-16 NOTE — Telephone Encounter (Signed)
 Called patient to discuss. Patient reports that she can hear her heartbeat in her left ear and that it has been going on for awhile but she did not mention it at last appointment. Patient is requesting a CT scan of her head and neck when she goes for calcium score tomorrow. Advised patient that the appt she has for tomorrow is for her calcium score. We cannot add CT scans that have not been authorized so this would be a separate appointment. Pt gave verbal understanding but says she would still like for Dr Lorin Picket to order the scans and set this up for her.

## 2023-05-17 ENCOUNTER — Ambulatory Visit
Admission: RE | Admit: 2023-05-17 | Discharge: 2023-05-17 | Disposition: A | Discharge status: Home / Self Care | Payer: Self-pay | Source: Ambulatory Visit | Attending: Internal Medicine | Admitting: Internal Medicine

## 2023-05-17 DIAGNOSIS — Z136 Encounter for screening for cardiovascular disorders: Secondary | ICD-10-CM | POA: Insufficient documentation

## 2023-05-23 ENCOUNTER — Encounter: Payer: Self-pay | Admitting: Internal Medicine

## 2023-05-23 ENCOUNTER — Ambulatory Visit (INDEPENDENT_AMBULATORY_CARE_PROVIDER_SITE_OTHER): Admitting: Internal Medicine

## 2023-05-23 VITALS — BP 110/70 | HR 66 | Temp 98.3°F | Resp 16 | Ht 61.0 in | Wt 142.0 lb

## 2023-05-23 DIAGNOSIS — H938X2 Other specified disorders of left ear: Secondary | ICD-10-CM | POA: Diagnosis not present

## 2023-05-23 DIAGNOSIS — E7800 Pure hypercholesterolemia, unspecified: Secondary | ICD-10-CM | POA: Diagnosis not present

## 2023-05-23 NOTE — Assessment & Plan Note (Signed)
 Was on crestor. Had persistent aching. Crestor stopped. Better off. Now on pravastatin. Tolerating three days per week. Follow.   The 10-year ASCVD risk score (Arnett DK, et al., 2019) is: 8.1%   Values used to calculate the score:     Age: 73 years     Sex: Female     Is Non-Hispanic African American: No     Diabetic: No     Tobacco smoker: No     Systolic Blood Pressure: 110 mmHg     Is BP treated: No     HDL Cholesterol: 45.1 mg/dL     Total Cholesterol: 195 mg/dL

## 2023-05-23 NOTE — Assessment & Plan Note (Addendum)
 Hears what sounds like a ceiling fan - swishing in her left ear. Constant static sound in both ears. Exam as outlined. Reports no change in hearing. Discussed further w/up. Will have ENT evaluate. Blood pressure doing well.

## 2023-05-23 NOTE — Progress Notes (Signed)
 Subjective:    [name], female    DOB: 1950-07-28, 73 y.o.   MRN: [id number]  Patient here for  Chief Complaint  Patient presents with   Left Ear    HPI Here for a work in appt - work in with concerns regarding "hearing heartbeat in her ears". Reports this is something she has noticed intermittently for years. Has worsened recently. Notices more in the evening. No change with changing positions. Describes the sound - "like a ceiling fan". Only in left ear. She hears what sounds like TV static in both ears. This is constant. No pain. Has not noticed a change in hearing. No change with position changes. No headache or dizziness. No vomiting or nausea.    Past Medical History:  Diagnosis Date   Abdominal wall hernia    secondary to large left renal cyst removal   Acute gastritis without hemorrhage    Arthritis    hands   Atrophic vaginitis    Car sickness    Carpal tunnel syndrome    Colon polyps    Complication of anesthesia    slow to wake   Diverticulosis    Fibrocystic breast disease    GERD (gastroesophageal reflux disease)    History of fatty infiltration of liver    Hyperlipidemia    PONV (postoperative nausea and vomiting)    Postmenopausal    Sinusitis    Past Surgical History:  Procedure Laterality Date   ABDOMINAL HYSTERECTOMY  2005   partial   APPENDECTOMY     BLEPHAROPLASTY     Bone Spur     removal - left index finger   BRAVO PH STUDY N/A 06/15/2022   Procedure: BRAVO PH STUDY;  Surgeon: Midge Minium, MD;  Location: [facility] END[facility];  Service: Endoscopy;  Laterality: N/A;   CATARACT EXTRACTION, BILATERAL  2000, 2005   COLONOSCOPY WITH PROPOFOL N/A 10/26/2019   Procedure: COLONOSCOPY WITH PROPOFOL;  Surgeon: Midge Minium, MD;  Location: [facility];  Service: Endoscopy;  Laterality: N/A;   COLONOSCOPY WITH PROPOFOL N/A 06/15/2022   Procedure: COLONOSCOPY WITH PROPOFOL;  Surgeon: Midge Minium, MD;  Location: [facility] END[facility]COPY;  Service:  Endoscopy;  Laterality: N/A;   ESOPHAGOGASTRODUODENOSCOPY (EGD) WITH PROPOFOL N/A 10/26/2019   Procedure: ESOPHAGOGASTRODUODENOSCOPY (EGD) WITH PROPOFOL and Dilation;  Surgeon: Midge Minium, MD;  Location: [facility];  Service: Endoscopy;  Laterality: N/A;   ESOPHAGOGASTRODUODENOSCOPY (EGD) WITH PROPOFOL N/A 06/15/2022   Procedure: ESOPHAGOGASTRODUODENOSCOPY (EGD) WITH PROPOFOL;  Surgeon: Midge Minium, MD;  Location: [facility]COPY;  Service: Endoscopy;  Laterality: N/A;   EXCISION [facility]  [id number]   Dr. Recardo Evangelist   HERNIA REPAIR     lumb hernia     RENAL CYST EXCISION     TUBAL LIGATION     Family History  Problem Relation Age of Onset   Cancer Father        prostate   Lung cancer Other        parent   Hypercholesterolemia Other        parent   Social History   Socioeconomic History   Marital status: Married    Spouse name: Not on file   Number of children: 2   Years of education: Not on file   Highest education level: 12th grade  Occupational History   Not on file  Tobacco Use   Smoking status: Former   Smokeless tobacco: Never  Vaping Use   Vaping status: Not on file  Substance and Sexual Activity   Alcohol use: Yes    Alcohol/week: 5.0 - 6.0 standard drinks of alcohol    Types: 5 - 6 Glasses of wine per week   Drug use: No   Sexual activity: Not on file  Other Topics Concern   Not on file  Social History Narrative   Not on file   Social Drivers of Health   Financial Resource Strain: Low Risk  (03/09/2023)   Overall Financial Resource Strain (CARDIA)    Difficulty of Paying Living Expenses: Not hard at all  Food Insecurity: No Food Insecurity (03/09/2023)   Hunger Vital Sign    Worried About Running Out of Food in the Last Year: Never true    Ran Out of Food in the Last Year: Never true  Transportation Needs: No Transportation Needs (03/09/2023)   PRAPARE - Administrator, Civil Service (Medical): No    Lack of Transportation  (Non-Medical): No  Physical Activity: Unknown (03/09/2023)   Exercise Vital Sign    Days of Exercise per Week: Patient declined    Minutes of Exercise per Session: Not on file  Stress: No Stress Concern Present (03/09/2023)   [employer] of Occupational Health - Occupational Stress Questionnaire    Feeling of Stress : Only a little  Social Connections: Moderately Integrated (03/09/2023)   Social Connection and Isolation Panel [NHANES]    Frequency of Communication with Friends and Family: Twice a week    Frequency of Social Gatherings with Friends and Family: Twice a week    Attends Religious Services: More than 4 times per year    Active Member of [employer] or Organizations: No    Attends Engineer, structural: Not on file    Marital Status: Married     Review of Systems  Constitutional:  Negative for appetite change and unexpected weight change.  HENT:  Negative for congestion, ear pain, hearing loss and sinus pressure.   Respiratory:  Negative for cough, chest tightness and shortness of breath.   Cardiovascular:  Negative for chest pain, palpitations and leg swelling.  Gastrointestinal:  Negative for abdominal pain, diarrhea, nausea and vomiting.  Genitourinary:  Negative for difficulty urinating and dysuria.  Musculoskeletal:  Negative for joint swelling and myalgias.  Skin:  Negative for color change and rash.  Neurological:  Negative for dizziness and headaches.  Psychiatric/Behavioral:  Negative for agitation and dysphoric mood.        Objective:     BP 110/70   Pulse 66   Temp 98.3 F (36.8 C)   Resp 16   Ht 5\' 1"  (1.549 m)   Wt 142 lb (64.4 kg)   LMP 02/28/1990   SpO2 98%   BMI 26.83 kg/m  Wt Readings from Last 3 Encounters:  05/23/23 142 lb (64.4 kg)  05/09/23 141 lb 12.8 oz (64.3 kg)  04/25/23 140 lb 6.4 oz (63.7 kg)    Physical Exam Vitals reviewed.  Constitutional:      General: She is not in acute distress.    Appearance: Normal  appearance.  HENT:     Head: Normocephalic and atraumatic.     Right Ear: Tympanic membrane and external ear normal.     Left Ear: Tympanic membrane and external ear normal.     Ears:     Comments: Some cerumen present. No impaction     Mouth/Throat:     Pharynx: No oropharyngeal exudate or posterior oropharyngeal erythema.  Eyes:  General: No scleral icterus.       Right eye: No discharge.        Left eye: No discharge.     Conjunctiva/sclera: Conjunctivae normal.  Neck:     Thyroid: No thyromegaly.  Cardiovascular:     Rate and Rhythm: Normal rate and regular rhythm.  Pulmonary:     Effort: No respiratory distress.     Breath sounds: Normal breath sounds. No wheezing.  Abdominal:     General: Bowel sounds are normal.     Palpations: Abdomen is soft.     Tenderness: There is no abdominal tenderness.  Musculoskeletal:        General: No swelling.     Cervical back: Neck supple. No tenderness.  Lymphadenopathy:     Cervical: No cervical adenopathy.  Skin:    Findings: No erythema or rash.  Neurological:     Mental Status: She is alert.  Psychiatric:        Mood and Affect: Mood normal.        Behavior: Behavior normal.         Outpatient Encounter Medications as of 05/23/2023  Medication Sig   ALPRAZolam (XANAX) 0.25 MG tablet Take 1 tablet (0.25 mg total) by mouth daily as needed.   esomeprazole (NEXIUM) 40 MG capsule Take 1 capsule (40 mg total) by mouth 2 (two) times daily before a meal.   fluticasone (FLONASE) 50 MCG/ACT nasal spray Place 2 sprays into both nostrils daily.   pravastatin (PRAVACHOL) 10 MG tablet Take one tablet three times per week.   sertraline (ZOLOFT) 50 MG tablet TAKE 1 TABLET BY MOUTH DAILY   traZODone (DESYREL) 50 MG tablet 1/2 tablet q hs   No facility-administered encounter medications on file as of 05/23/2023.     Lab Results  Component Value Date   WBC 7.1 03/10/2023   HGB 13.5 03/10/2023   HCT 40.4 03/10/2023   PLT 238.0  03/10/2023   GLUCOSE 87 05/05/2023   CHOL 195 05/05/2023   TRIG 253.0 (H) 05/05/2023   HDL 45.10 05/05/2023   LDLDIRECT 132.0 04/21/2022   LDLCALC 99 05/05/2023   ALT 28 05/05/2023   AST 22 05/05/2023   NA 141 05/05/2023   K 4.2 05/05/2023   CL 106 05/05/2023   CREATININE 0.79 05/05/2023   BUN 11 05/05/2023   CO2 26 05/05/2023   TSH 2.50 03/10/2023   HGBA1C 5.3 05/05/2023    CT CARDIAC SCORING (SELF PAY ONLY) Addendum Date: 05/19/2023 ADDENDUM REPORT: 05/19/2023 13:37 ADDENDUM: OVER-READ INTERPRETATION  CT CHEST The following report is an over-read performed by radiologist Dr. Avanell Bob [facility] on 04/29/2023. This over-read does not include interpretation of cardiac or coronary anatomy or pathology. The coronary calcium score interpretation by the cardiologist is attached. COMPARISON:  None. FINDINGS: Within the visualized portions of the lungs demonstrates no acute infiltrates consolidations.  No pulmonary nodules or masses. No pleural effusions. No mediastinal masses or adenopathy. No pericardial effusions. Aorta appears normal caliber. No acute findings in the upper abdomen. Chest wall and soft tissues are unremarkable without evidence of acute bony abnormalities. Coronary artery calcifications.  Large hiatal hernia IMPRESSION: *Large hiatal hernia. *Coronary artery calcifications. *No acute findings in the visualized portions of the lungs. Electronically Signed   By: Fredrich Jefferson M.D.   On: 05/19/2023 13:37   Result Date: 05/19/2023 CLINICAL DATA:  Risk stratification EXAM: Coronary Calcium Score TECHNIQUE: The patient was scanned on a Siemens Somatom scanner. Axial non-contrast 3 mm  slices were carried out through the heart. The data set was analyzed on a dedicated work station and scored using the Agatson method. FINDINGS: Non-cardiac: See separate report from [facility]. Ascending Aorta: Normal size Pericardium: Normal Coronary arteries: Normal origin of  left and right coronary arteries. Distribution of arterial calcifications if present, as noted below; LM 0 LAD 93.8 LCx 8.03 RCA 0 Total 102 IMPRESSION AND RECOMMENDATION: 1. Coronary calcium score of 102. This was 70th percentile for age and sex matched control. 2. CAC 100-299 in LAD, LCx. CAC-DRS A2/N2. 3. Continue heart healthy lifestyle and risk factor modification. Electronically Signed: By: Constancia Delton M.D. On: 05/17/2023 14:08       Assessment & Plan:  Audible heartbeat in left ear Assessment & Plan: Hears what sounds like a ceiling fan - swishing in her left ear. Constant static sound in both ears. Exam as outlined. Reports no change in hearing. Discussed further w/up. Will have ENT evaluate. Blood pressure doing well.   Orders: -     Ambulatory referral to ENT  Hypercholesterolemia Assessment & Plan: Was on crestor. Had persistent aching. Crestor stopped. Better off. Now on pravastatin. Tolerating three days per week. Follow.   The 10-year ASCVD risk score (Arnett DK, et al., 2019) is: 8.1%   Values used to calculate the score:     Age: 40 years     Sex: Female     Is Non-Hispanic African American: No     Diabetic: No     Tobacco smoker: No     Systolic Blood Pressure: 110 mmHg     Is BP treated: No     HDL Cholesterol: 45.1 mg/dL     Total Cholesterol: 195 mg/dL       Dellar Fenton, MD

## 2023-05-31 ENCOUNTER — Encounter: Payer: Self-pay | Admitting: Internal Medicine

## 2023-06-06 DIAGNOSIS — H9313 Tinnitus, bilateral: Secondary | ICD-10-CM | POA: Diagnosis not present

## 2023-06-06 DIAGNOSIS — H903 Sensorineural hearing loss, bilateral: Secondary | ICD-10-CM | POA: Diagnosis not present

## 2023-06-06 DIAGNOSIS — H93A2 Pulsatile tinnitus, left ear: Secondary | ICD-10-CM | POA: Diagnosis not present

## 2023-06-07 ENCOUNTER — Other Ambulatory Visit: Payer: Self-pay | Admitting: Student

## 2023-06-07 ENCOUNTER — Encounter: Payer: Self-pay | Admitting: Student

## 2023-06-07 DIAGNOSIS — H93A2 Pulsatile tinnitus, left ear: Secondary | ICD-10-CM

## 2023-06-10 ENCOUNTER — Ambulatory Visit
Admission: RE | Admit: 2023-06-10 | Discharge: 2023-06-10 | Disposition: A | Discharge status: Home / Self Care | Source: Ambulatory Visit | Attending: Student | Admitting: Student

## 2023-06-10 ENCOUNTER — Ambulatory Visit
Admission: RE | Admit: 2023-06-10 | Discharge: 2023-06-10 | Disposition: A | Discharge status: Home / Self Care | Source: Ambulatory Visit | Attending: Student

## 2023-06-10 DIAGNOSIS — H93A2 Pulsatile tinnitus, left ear: Secondary | ICD-10-CM

## 2023-06-10 MED ORDER — GADOPICLENOL 0.5 MMOL/ML IV SOLN
7.5000 mL | Freq: Once | INTRAVENOUS | Status: AC | PRN
  Administered 2023-06-10: 7.5 mL via INTRAVENOUS

## 2023-06-15 ENCOUNTER — Other Ambulatory Visit: Payer: Self-pay | Admitting: Internal Medicine

## 2023-06-21 ENCOUNTER — Other Ambulatory Visit

## 2023-06-28 ENCOUNTER — Encounter: Payer: Self-pay | Admitting: Internal Medicine

## 2023-06-28 MED ORDER — ALPRAZOLAM 0.25 MG PO TABS: 0.2500 mg | ORAL_TABLET | Freq: Every day | ORAL | 0 refills | Status: AC | PRN

## 2023-06-28 NOTE — Telephone Encounter (Signed)
Rx ok'd for xanax #30 with no refills.  PDMP reviewed.

## 2023-07-18 DIAGNOSIS — Z961 Presence of intraocular lens: Secondary | ICD-10-CM | POA: Diagnosis not present

## 2023-07-18 DIAGNOSIS — H4312 Vitreous hemorrhage, left eye: Secondary | ICD-10-CM | POA: Diagnosis not present

## 2023-07-18 DIAGNOSIS — H43812 Vitreous degeneration, left eye: Secondary | ICD-10-CM | POA: Diagnosis not present

## 2023-07-18 DIAGNOSIS — H04123 Dry eye syndrome of bilateral lacrimal glands: Secondary | ICD-10-CM | POA: Diagnosis not present

## 2023-07-20 DIAGNOSIS — M25562 Pain in left knee: Secondary | ICD-10-CM | POA: Diagnosis not present

## 2023-07-20 DIAGNOSIS — M25561 Pain in right knee: Secondary | ICD-10-CM | POA: Diagnosis not present

## 2023-07-29 DIAGNOSIS — H43813 Vitreous degeneration, bilateral: Secondary | ICD-10-CM | POA: Diagnosis not present

## 2023-07-29 DIAGNOSIS — H4312 Vitreous hemorrhage, left eye: Secondary | ICD-10-CM | POA: Diagnosis not present

## 2023-07-30 ENCOUNTER — Encounter: Payer: Self-pay | Admitting: Internal Medicine

## 2023-08-01 ENCOUNTER — Telehealth (INDEPENDENT_AMBULATORY_CARE_PROVIDER_SITE_OTHER): Admitting: Internal Medicine

## 2023-08-01 ENCOUNTER — Encounter: Payer: Self-pay | Admitting: Internal Medicine

## 2023-08-01 VITALS — BP 110/70 | Ht 61.0 in | Wt 140.0 lb

## 2023-08-01 DIAGNOSIS — R051 Acute cough: Secondary | ICD-10-CM | POA: Diagnosis not present

## 2023-08-01 MED ORDER — PREDNISONE 10 MG PO TABS: ORAL_TABLET | ORAL | 0 refills | Status: DC

## 2023-08-01 MED ORDER — AMOXICILLIN-POT CLAVULANATE 875-125 MG PO TABS: 1.0000 | ORAL_TABLET | Freq: Two times a day (BID) | ORAL | 0 refills | Status: DC

## 2023-08-01 NOTE — Telephone Encounter (Signed)
 Ok to add

## 2023-08-01 NOTE — Telephone Encounter (Signed)
 Can we add her on at 4:30 virtual to discuss?   Cough and congestion x 2 weeks. NO sob, chest tightness, fever, etc. Flying out to [place]  on Wednesday

## 2023-08-01 NOTE — Progress Notes (Signed)
 [name], female   DOB: 1950/08/23, 73 y.o.   MRN: [id number]   Virtual Visit via video Note  I connected with Kara Burns by a video enabled telemedicine application and verified that I am speaking with the correct person using two identifiers. Location patient: home Location provider: work  Persons participating in the virtual visit: patient, provider  The limitations, risks, security and privacy concerns of performing an evaluation and management service by video and the availability of in person appointments have been discussed. It has also been discussed with the patient that there may be a patient responsible charge related to this service. The patient expressed understanding and agreed to proceed.   Reason for visit: work in appt  HPI: Work in with concerns regarding increased nasal congestion, chest congestion and cough. Reports symptoms have been present for approximately 2 weeks. Symptoms - sore throat, increased drainage. Using flonase . Allegra  does not help. Did start taking mucinex  4-5 days ago. Reports persistent increased cough and chest congestion. Reports ears are not stopped up. Some nasal congestion - occasional blood tinged. No fever. Sore throat has resolved. No sob. No vomiting or diarrhea. Is having some coughing fits. Seeing ophthalmology - following up with retina specialists.    ROS: See pertinent positives and negatives per HPI.  Past Medical History:  Diagnosis Date   Abdominal wall hernia    secondary to large left renal cyst removal   Acute gastritis without hemorrhage    Arthritis    hands   Atrophic vaginitis    Car sickness    Carpal tunnel syndrome    Colon polyps    Complication of anesthesia    slow to wake   Diverticulosis    Fibrocystic breast disease    GERD (gastroesophageal reflux disease)    History of fatty infiltration of liver    Hyperlipidemia    PONV (postoperative nausea and vomiting)    Postmenopausal     Sinusitis     Past Surgical History:  Procedure Laterality Date   ABDOMINAL HYSTERECTOMY  2005   partial   APPENDECTOMY     BLEPHAROPLASTY     Bone Spur     removal - left index finger   BRAVO PH STUDY N/A 06/15/2022   Procedure: BRAVO PH STUDY;  Surgeon: Jinny Carmine, MD;  Location: [facility] END[facility];  Service: Endoscopy;  Laterality: N/A;   CATARACT EXTRACTION, BILATERAL  2000, 2005   COLONOSCOPY WITH PROPOFOL  N/A 10/26/2019   Procedure: COLONOSCOPY WITH PROPOFOL ;  Surgeon: Jinny Carmine, MD;  Location: [facility];  Service: Endoscopy;  Laterality: N/A;   COLONOSCOPY WITH PROPOFOL  N/A 06/15/2022   Procedure: COLONOSCOPY WITH PROPOFOL ;  Surgeon: Jinny Carmine, MD;  Location: [facility];  Service: Endoscopy;  Laterality: N/A;   ESOPHAGOGASTRODUODENOSCOPY (EGD) WITH PROPOFOL  N/A 10/26/2019   Procedure: ESOPHAGOGASTRODUODENOSCOPY (EGD) WITH PROPOFOL  and Dilation;  Surgeon: Jinny Carmine, MD;  Location: [facility];  Service: Endoscopy;  Laterality: N/A;   ESOPHAGOGASTRODUODENOSCOPY (EGD) WITH PROPOFOL  N/A 06/15/2022   Procedure: ESOPHAGOGASTRODUODENOSCOPY (EGD) WITH PROPOFOL ;  Surgeon: Jinny Carmine, MD;  Location: [facility];  Service: Endoscopy;  Laterality: N/A;   EXCISION [facility]  [id number]   Dr. Donnice Cory   HERNIA REPAIR     lumb hernia     RENAL CYST EXCISION     TUBAL LIGATION      Family History  Problem Relation Age of Onset   Cancer Father        prostate   Lung  cancer Other        parent   Hypercholesterolemia Other        parent    SOCIAL HX: reviewed.    Current Outpatient Medications:    ALPRAZolam  (XANAX ) 0.25 MG tablet, Take 1 tablet (0.25 mg total) by mouth daily as needed., Disp: 30 tablet, Rfl: 0   amoxicillin -clavulanate (AUGMENTIN ) 875-125 MG tablet, Take 1 tablet by mouth 2 (two) times daily., Disp: 14 tablet, Rfl: 0   esomeprazole  (NEXIUM ) 40 MG capsule, Take 1 capsule (40 mg total) by mouth 2 (two) times daily before a meal.,  Disp: 60 capsule, Rfl: 11   fluticasone  (FLONASE ) 50 MCG/ACT nasal spray, PLACE 2 SPRAYS INTO BOTH NOSTRILS DAILY., Disp: 16 g, Rfl: 3   pravastatin  (PRAVACHOL ) 10 MG tablet, Take one tablet three times per week., Disp: 39 tablet, Rfl: 1   predniSONE  (DELTASONE ) 10 MG tablet, Take 4 tablets x 1 day and then decrease by 1/2 tablet per day until down to zero mg., Disp: 18 tablet, Rfl: 0   sertraline  (ZOLOFT ) 50 MG tablet, TAKE 1 TABLET BY MOUTH DAILY, Disp: 90 tablet, Rfl: 1   traZODone  (DESYREL ) 50 MG tablet, 1/2 tablet q hs, Disp: 30 tablet, Rfl: 1  EXAM:  GENERAL: alert, oriented, appears well and in no acute distress  HEENT: atraumatic, conjunttiva clear, no obvious abnormalities on inspection of external nose and ears  NECK: normal movements of the head and neck  LUNGS: on inspection no signs of respiratory distress, breathing rate appears normal, no obvious gross SOB, gasping or wheezing. Increased cough with forced expiration.   CV: no obvious cyanosis  PSYCH/NEURO: pleasant and cooperative, no obvious depression or anxiety, speech and thought processing grossly intact  ASSESSMENT AND PLAN:  Discussed the following assessment and plan:  Problem List Items Addressed This Visit     Cough - Primary   Persistent increased nasal congestion, cough and chest congestion.  No ear fullness. No sob. Symptoms persistent now for approximately 2 weeks despite flonase  and mucinex . Will continue saline nasal spray and flonase  nasal spray. Also discussed afrin nasal spray around her flight. She planning to fly this week. Also, given persistence despite the above medications, do feel abx are warranted. Treat with augmentin  and prednisone  taper as directed. Follow.  Call with update.        Return if symptoms worsen or fail to improve.   I discussed the assessment and treatment plan with the patient. The patient was provided an opportunity to ask questions and all were answered. The patient  agreed with the plan and demonstrated an understanding of the instructions.   The patient was advised to call back or seek an in-person evaluation if the symptoms worsen or if the condition fails to improve as anticipated.    Allena Hamilton, MD

## 2023-08-01 NOTE — Telephone Encounter (Signed)
 Pt scheduled

## 2023-08-01 NOTE — Telephone Encounter (Signed)
 Copied from CRM [id number]. Topic: Clinical - Medical Advice >> Aug 01, 2023 10:02 AM Kara Burns wrote: Reason for CRM: Patient called in stating she has not heard back about her mychart message over the weekend and the patient stated she has nasal congestion, a cough and goes out of town Wednesday and needs something before then. Patient requested an appointment today but none were available,  patient can not do an appointment tomorrow.

## 2023-08-02 ENCOUNTER — Encounter: Payer: Self-pay | Admitting: Internal Medicine

## 2023-08-06 ENCOUNTER — Encounter: Payer: Self-pay | Admitting: Internal Medicine

## 2023-08-06 NOTE — Assessment & Plan Note (Signed)
 Persistent increased nasal congestion, cough and chest congestion.  No ear fullness. No sob. Symptoms persistent now for approximately 2 weeks despite flonase  and mucinex . Will continue saline nasal spray and flonase  nasal spray. Also discussed afrin nasal spray around her flight. She planning to fly this week. Also, given persistence despite the above medications, do feel abx are warranted. Treat with augmentin  and prednisone  taper as directed. Follow.  Call with update.

## 2023-08-10 DIAGNOSIS — H43392 Other vitreous opacities, left eye: Secondary | ICD-10-CM | POA: Diagnosis not present

## 2023-08-10 DIAGNOSIS — H4312 Vitreous hemorrhage, left eye: Secondary | ICD-10-CM | POA: Diagnosis not present

## 2023-08-10 DIAGNOSIS — H43813 Vitreous degeneration, bilateral: Secondary | ICD-10-CM | POA: Diagnosis not present

## 2023-08-13 ENCOUNTER — Other Ambulatory Visit: Payer: Self-pay | Admitting: Internal Medicine

## 2023-08-14 ENCOUNTER — Encounter: Payer: Self-pay | Admitting: Internal Medicine

## 2023-08-15 ENCOUNTER — Ambulatory Visit: Payer: Self-pay | Admitting: Internal Medicine

## 2023-08-15 ENCOUNTER — Ambulatory Visit (INDEPENDENT_AMBULATORY_CARE_PROVIDER_SITE_OTHER): Admitting: Internal Medicine

## 2023-08-15 ENCOUNTER — Ambulatory Visit (INDEPENDENT_AMBULATORY_CARE_PROVIDER_SITE_OTHER)

## 2023-08-15 VITALS — BP 126/70 | HR 76 | Temp 98.2°F | Ht 61.0 in | Wt 141.8 lb

## 2023-08-15 DIAGNOSIS — R052 Subacute cough: Secondary | ICD-10-CM

## 2023-08-15 DIAGNOSIS — J069 Acute upper respiratory infection, unspecified: Secondary | ICD-10-CM | POA: Diagnosis not present

## 2023-08-15 DIAGNOSIS — R059 Cough, unspecified: Secondary | ICD-10-CM | POA: Diagnosis not present

## 2023-08-15 MED ORDER — BENZONATATE 200 MG PO CAPS: 200.0000 mg | ORAL_CAPSULE | Freq: Two times a day (BID) | ORAL | 0 refills | Status: DC | PRN

## 2023-08-15 MED ORDER — HYDROCOD POLI-CHLORPHE POLI ER 10-8 MG/5ML PO SUER: 5.0000 mL | Freq: Two times a day (BID) | ORAL | 0 refills | Status: DC | PRN

## 2023-08-15 MED ORDER — DOXYCYCLINE HYCLATE 100 MG PO TABS: 100.0000 mg | ORAL_TABLET | Freq: Two times a day (BID) | ORAL | 0 refills | Status: DC

## 2023-08-15 NOTE — Assessment & Plan Note (Signed)
-   Patient complains of persistent cough, postnasal drip, nasal congestion and occasional epistaxis over the last 4 weeks.  She was treated with Augmentin  and prednisone  by Dr. Glendia on June 23 and initially improved but then her symptoms recurred -On exam today, patient with some mild bilateral frontal sinus tenderness.  Lungs are clear to auscultation bilaterally.  TM not visualized bilaterally secondary wax -I am concerned for recurrent sinusitis and will treat with doxycycline  (she completed a course of Augmentin  previously) -We will also prescribe Tessalon  Perles for her cough in addition to Tussionex to use at night -Continue with conservative measures including saline nasal rinses, warm showers with steam inhalation and over-the-counter Zyrtec -No further workup at this time

## 2023-08-15 NOTE — Progress Notes (Signed)
 Acute Office Visit  Subjective:     [name], female    DOB: Jul 05, 1950, 73 y.o.   MRN: [id number]  Chief Complaint  Patient presents with   Cough   Nasal Congestion   Sinus Problem    Cough Pertinent negatives include no hemoptysis, sore throat, shortness of breath or wheezing.  Sinus Problem Associated symptoms include congestion and coughing. Pertinent negatives include no shortness of breath or sore throat.   Patient is in today for persistent cough and nasal congestion for the last 4 weeks.  Patient states that she had a televisit with Dr. Glendia on June 23 for similar symptoms and was prescribed Augmentin  and prednisone  which initially improved her symptoms.  However, her symptoms returned in a few days.  Patient states that she flew to [place]  and then [place]  in the last couple weeks and noted epistaxis and nasal congestion as well as a cough on those trips.  Since returning on July 1 she has remained fatigued and had associated cough occasionally productive of phlegm as well as rhinorrhea and nasal congestion.  She does complain of pressure over the front of her head as well.  No fevers or chills.  Epistaxis has since mostly resolved.  Review of Systems  Constitutional:  Positive for malaise/fatigue.  HENT:  Positive for congestion, nosebleeds and sinus pain. Negative for ear discharge, hearing loss and sore throat.   Respiratory:  Positive for cough and sputum production. Negative for hemoptysis, shortness of breath and wheezing.   Cardiovascular: Negative.   Gastrointestinal: Negative.   Musculoskeletal: Negative.   Neurological: Negative.        Complains of pressure over the front of her head but not true headaches  Psychiatric/Behavioral: Negative.          Objective:    BP 126/70   Pulse 76   Temp 98.2 F (36.8 C) (Oral)   Ht 5' 1 (1.549 m)   Wt 141 lb 12.8 oz (64.3 kg)   LMP 02/28/1990   SpO2 96%   BMI 26.79 kg/m    Physical  Exam Constitutional:      Appearance: Normal appearance.  HENT:     Head: Normocephalic and atraumatic.     Right Ear: There is impacted cerumen.     Left Ear: There is impacted cerumen.     Ears:     Comments: TM not clearly visualized secondary to cerumen    Mouth/Throat:     Pharynx: Oropharynx is clear. No oropharyngeal exudate or posterior oropharyngeal erythema.  Cardiovascular:     Rate and Rhythm: Normal rate and regular rhythm.     Heart sounds: Normal heart sounds.  Pulmonary:     Effort: No respiratory distress.     Breath sounds: Normal breath sounds. No wheezing or rales.  Musculoskeletal:        General: No swelling or tenderness.     Cervical back: Neck supple.  Lymphadenopathy:     Cervical: No cervical adenopathy.  Neurological:     Mental Status: She is alert and oriented to person, place, and time.  Psychiatric:        Mood and Affect: Mood normal.        Behavior: Behavior normal.     No results found for any visits on 08/15/23.      Assessment & Plan:   Problem List Items Addressed This Visit       Respiratory   URI (upper respiratory infection)   -  Patient complains of persistent cough, postnasal drip, nasal congestion and occasional epistaxis over the last 4 weeks.  She was treated with Augmentin  and prednisone  by Dr. Glendia on June 23 and initially improved but then her symptoms recurred -On exam today, patient with some mild bilateral frontal sinus tenderness.  Lungs are clear to auscultation bilaterally.  TM not visualized bilaterally secondary wax -I am concerned for recurrent sinusitis and will treat with doxycycline  (she completed a course of Augmentin  previously) -We will also prescribe Tessalon  Perles for her cough in addition to Tussionex to use at night -Continue with conservative measures including saline nasal rinses, warm showers with steam inhalation and over-the-counter Zyrtec -No further workup at this time      Relevant  Medications   valACYclovir (VALTREX) 500 MG tablet   benzonatate  (TESSALON ) 200 MG capsule   doxycycline  (VIBRA -TABS) 100 MG tablet     Other   Cough - Primary   - Given that patient has had recurrent cough for the last 4 weeks I am concerned for possible underlying pneumonia even though the likely diagnosis remains a viral infection with postviral bronchitis -Will obtain a chest x-ray for further evaluation -Other management as above under URI.      Relevant Medications   chlorpheniramine-HYDROcodone (TUSSIONEX) 10-8 MG/5ML   Other Relevant Orders   DG Chest 2 View    Meds ordered this encounter  Medications   benzonatate  (TESSALON ) 200 MG capsule    Sig: Take 1 capsule (200 mg total) by mouth 2 (two) times daily as needed for cough.    Dispense:  20 capsule    Refill:  0   doxycycline  (VIBRA -TABS) 100 MG tablet    Sig: Take 1 tablet (100 mg total) by mouth 2 (two) times daily.    Dispense:  10 tablet    Refill:  0   chlorpheniramine-HYDROcodone (TUSSIONEX) 10-8 MG/5ML    Sig: Take 5 mLs by mouth every 12 (twelve) hours as needed for cough.    Dispense:  115 mL    Refill:  0    No follow-ups on file.  Ipek Westra, MD

## 2023-08-15 NOTE — Assessment & Plan Note (Signed)
-   Given that patient has had recurrent cough for the last 4 weeks I am concerned for possible underlying pneumonia even though the likely diagnosis remains a viral infection with postviral bronchitis -Will obtain a chest x-ray for further evaluation -Other management as above under URI.

## 2023-08-15 NOTE — Patient Instructions (Addendum)
-   It was a pleasure meeting you today -I suspect that you likely have an acute sinusitis versus an upper respiratory tract infection -Given that your symptoms have not improved despite being treated with Augmentin  we will prescribe another antibiotic for you called doxycycline  -I have also prescribed medication for the cough including Tessalon  Perles and a cough syrup.  The cough syrup contains codeine .  Please do not use it and drive or operate heavy machinery -Continue with conservative measures including Mucinex , saline nasal rinses, warm showers with steam inhalation -We will obtain a chest x-ray for further evaluation as well -If your symptoms not improved please follow-up with us .  Please call us  with any questions or concerns or if you need any refills

## 2023-08-24 ENCOUNTER — Encounter: Payer: Self-pay | Admitting: Internal Medicine

## 2023-08-25 NOTE — Telephone Encounter (Signed)
 Please call pt and confirm what she is currently taking now. Confirm using saline nasal spray (flush nose 2x/day), flonase  or nasocort - 2 sprays each nostril one time per day.  I would recommend doing this in the evening. If allegra  is making her feel worse, stop allegra . See if she can take mucinex . If so, would recommend starting mucinex . Given persistent symptoms despite two rounds of abx and previous prednisone , would recommend ENT evaluation. If agreeable, see if she has a preference of which ENT she prefers to see.

## 2023-08-25 NOTE — Telephone Encounter (Signed)
 Patient has not taken anything in a few days. Will start nasal sprays and mucinex . Will stop allegra . She is established with Dr Milissa so she will call them.

## 2023-09-06 ENCOUNTER — Other Ambulatory Visit

## 2023-09-06 DIAGNOSIS — J324 Chronic pansinusitis: Secondary | ICD-10-CM | POA: Diagnosis not present

## 2023-09-06 DIAGNOSIS — J019 Acute sinusitis, unspecified: Secondary | ICD-10-CM | POA: Diagnosis not present

## 2023-09-06 DIAGNOSIS — R0982 Postnasal drip: Secondary | ICD-10-CM | POA: Diagnosis not present

## 2023-09-08 ENCOUNTER — Ambulatory Visit: Admitting: Internal Medicine

## 2023-09-09 DIAGNOSIS — H4312 Vitreous hemorrhage, left eye: Secondary | ICD-10-CM | POA: Diagnosis not present

## 2023-09-09 DIAGNOSIS — H43392 Other vitreous opacities, left eye: Secondary | ICD-10-CM | POA: Diagnosis not present

## 2023-09-09 DIAGNOSIS — H43813 Vitreous degeneration, bilateral: Secondary | ICD-10-CM | POA: Diagnosis not present

## 2023-09-23 DIAGNOSIS — J301 Allergic rhinitis due to pollen: Secondary | ICD-10-CM | POA: Diagnosis not present

## 2023-09-23 DIAGNOSIS — J329 Chronic sinusitis, unspecified: Secondary | ICD-10-CM | POA: Diagnosis not present

## 2023-09-23 DIAGNOSIS — R0982 Postnasal drip: Secondary | ICD-10-CM | POA: Diagnosis not present

## 2023-10-03 DIAGNOSIS — H43392 Other vitreous opacities, left eye: Secondary | ICD-10-CM | POA: Diagnosis not present

## 2023-10-03 DIAGNOSIS — H4312 Vitreous hemorrhage, left eye: Secondary | ICD-10-CM | POA: Diagnosis not present

## 2023-10-11 DIAGNOSIS — K458 Other specified abdominal hernia without obstruction or gangrene: Secondary | ICD-10-CM | POA: Diagnosis not present

## 2023-10-12 DIAGNOSIS — H4312 Vitreous hemorrhage, left eye: Secondary | ICD-10-CM | POA: Diagnosis not present

## 2023-10-12 DIAGNOSIS — H43811 Vitreous degeneration, right eye: Secondary | ICD-10-CM | POA: Diagnosis not present

## 2023-10-20 DIAGNOSIS — M170 Bilateral primary osteoarthritis of knee: Secondary | ICD-10-CM | POA: Diagnosis not present

## 2023-11-02 DIAGNOSIS — K449 Diaphragmatic hernia without obstruction or gangrene: Secondary | ICD-10-CM | POA: Diagnosis not present

## 2023-11-02 DIAGNOSIS — N200 Calculus of kidney: Secondary | ICD-10-CM | POA: Diagnosis not present

## 2023-11-02 DIAGNOSIS — K458 Other specified abdominal hernia without obstruction or gangrene: Secondary | ICD-10-CM | POA: Diagnosis not present

## 2023-11-02 DIAGNOSIS — R1900 Intra-abdominal and pelvic swelling, mass and lump, unspecified site: Secondary | ICD-10-CM | POA: Diagnosis not present

## 2023-11-03 DIAGNOSIS — H4312 Vitreous hemorrhage, left eye: Secondary | ICD-10-CM | POA: Diagnosis not present

## 2023-11-03 DIAGNOSIS — H43811 Vitreous degeneration, right eye: Secondary | ICD-10-CM | POA: Diagnosis not present

## 2023-11-07 ENCOUNTER — Other Ambulatory Visit: Payer: Self-pay | Admitting: Internal Medicine

## 2023-11-07 ENCOUNTER — Encounter: Payer: Self-pay | Admitting: Internal Medicine

## 2023-11-07 NOTE — Telephone Encounter (Signed)
 Reviewed message. Agree with appt - scheduled. Need to know what referral is needed. Also please call and confirm she is doing ok and ok to wait until 11/15/23 appt.

## 2023-11-07 NOTE — Telephone Encounter (Signed)
 Spoke to pt. Pt stated that the provider that did the scan is wanting her to follow up with pcp and get a referral for pt. Informed pt she will need an appt . Scheduled for 10/7 at 2pm

## 2023-11-08 NOTE — Telephone Encounter (Signed)
 Pt needing referral for PT- pelvic floor therapy. Patient is ok to wait until 10/7 to be seen

## 2023-11-15 ENCOUNTER — Ambulatory Visit: Admitting: Internal Medicine

## 2023-11-15 ENCOUNTER — Encounter: Payer: Self-pay | Admitting: Internal Medicine

## 2023-11-15 VITALS — BP 108/68 | HR 76 | Resp 16 | Ht 61.0 in | Wt 147.0 lb

## 2023-11-15 DIAGNOSIS — M25562 Pain in left knee: Secondary | ICD-10-CM | POA: Diagnosis not present

## 2023-11-15 DIAGNOSIS — K458 Other specified abdominal hernia without obstruction or gangrene: Secondary | ICD-10-CM

## 2023-11-15 DIAGNOSIS — E7800 Pure hypercholesterolemia, unspecified: Secondary | ICD-10-CM

## 2023-11-15 DIAGNOSIS — R739 Hyperglycemia, unspecified: Secondary | ICD-10-CM | POA: Diagnosis not present

## 2023-11-15 DIAGNOSIS — F439 Reaction to severe stress, unspecified: Secondary | ICD-10-CM

## 2023-11-15 DIAGNOSIS — L989 Disorder of the skin and subcutaneous tissue, unspecified: Secondary | ICD-10-CM | POA: Diagnosis not present

## 2023-11-15 DIAGNOSIS — F1090 Alcohol use, unspecified, uncomplicated: Secondary | ICD-10-CM

## 2023-11-15 DIAGNOSIS — Z1211 Encounter for screening for malignant neoplasm of colon: Secondary | ICD-10-CM

## 2023-11-15 DIAGNOSIS — R7989 Other specified abnormal findings of blood chemistry: Secondary | ICD-10-CM

## 2023-11-15 DIAGNOSIS — N281 Cyst of kidney, acquired: Secondary | ICD-10-CM

## 2023-11-15 LAB — HEPATIC FUNCTION PANEL
ALT: 31 U/L (ref 0–35)
AST: 22 U/L (ref 0–37)
Albumin: 4.4 g/dL (ref 3.5–5.2)
Alkaline Phosphatase: 57 U/L (ref 39–117)
Bilirubin, Direct: 0.1 mg/dL (ref 0.0–0.3)
Total Bilirubin: 0.5 mg/dL (ref 0.2–1.2)
Total Protein: 6.6 g/dL (ref 6.0–8.3)

## 2023-11-15 LAB — BASIC METABOLIC PANEL WITH GFR
BUN: 20 mg/dL (ref 6–23)
CO2: 28 meq/L (ref 19–32)
Calcium: 9.4 mg/dL (ref 8.4–10.5)
Chloride: 103 meq/L (ref 96–112)
Creatinine, Ser: 0.72 mg/dL (ref 0.40–1.20)
GFR: 83.7 mL/min (ref >60.00)
Glucose, Bld: 102 mg/dL — ABNORMAL HIGH (ref 70–99)
Potassium: 4.1 meq/L (ref 3.5–5.1)
Sodium: 139 meq/L (ref 135–145)

## 2023-11-15 LAB — LIPID PANEL
Cholesterol: 225 mg/dL — ABNORMAL HIGH (ref 0–200)
HDL: 47.3 mg/dL (ref >39.00)
LDL Cholesterol: 101 mg/dL — ABNORMAL HIGH (ref 0–99)
NonHDL: 177.95
Total CHOL/HDL Ratio: 5
Triglycerides: 387 mg/dL — ABNORMAL HIGH (ref 0.0–149.0)
VLDL: 77.4 mg/dL — ABNORMAL HIGH (ref 0.0–40.0)

## 2023-11-15 LAB — HEMOGLOBIN A1C: Hgb A1c MFr Bld: 5.4 % (ref 4.6–6.5)

## 2023-11-15 NOTE — Progress Notes (Signed)
 Subjective:    [name], female    DOB: 1950/12/08, 73 y.o.   MRN: [id number]  Patient here for  Chief Complaint  Patient presents with   Medical Management of Chronic Issues    HPI Here for a work in appt. Work in to discuss recent CT scan and referral. Recently evaluated by surgery - recurrent left flank bulge. CT - no hernia. Mesh is intact. Felt the bulge was likely due to eventration, which was felt coule be managed with core strengthening exercises. Surgery recommended pelvic floor therapy. Discussed. Request Chyrl Deal. Breathing stable. No chest paion reported. No abdominal pain reported. S/p left knee cortisone injection 9/11. Has f/u with Dr Mardee 12/20/23. Right forearm lesion - discussed f/u with Dr Arlyss. Followed by Dr Selinda [facility].    Past Medical History:  Diagnosis Date   Abdominal wall hernia    secondary to large left renal cyst removal   Acute gastritis without hemorrhage    Anxiety Don't know   Arthritis    hands   Atrophic vaginitis    Car sickness    Carpal tunnel syndrome    Cataract Don't know   Colon polyps    Complication of anesthesia    slow to wake   Depression    Don't know   Diverticulosis    Fibrocystic breast disease    GERD (gastroesophageal reflux disease)    History of fatty infiltration of liver    Hyperlipidemia    PONV (postoperative nausea and vomiting)    Postmenopausal    Sinusitis    Past Surgical History:  Procedure Laterality Date   ABDOMINAL HYSTERECTOMY  02/09/2003   partial   APPENDECTOMY     BLEPHAROPLASTY     Bone Spur     removal - left index finger   BRAVO PH STUDY N/A 06/15/2022   Procedure: BRAVO PH STUDY;  Surgeon: Jinny Carmine, MD;  Location: [facility];  Service: Endoscopy;  Laterality: N/A;   CATARACT EXTRACTION, BILATERAL  2000, 2005   COLONOSCOPY WITH PROPOFOL  N/A 10/26/2019   Procedure: COLONOSCOPY WITH PROPOFOL ;  Surgeon: Jinny Carmine, MD;   Location: [facility];  Service: Endoscopy;  Laterality: N/A;   COLONOSCOPY WITH PROPOFOL  N/A 06/15/2022   Procedure: COLONOSCOPY WITH PROPOFOL ;  Surgeon: Jinny Carmine, MD;  Location: [facility];  Service: Endoscopy;  Laterality: N/A;   COSMETIC SURGERY     N/A   ESOPHAGOGASTRODUODENOSCOPY (EGD) WITH PROPOFOL  N/A 10/26/2019   Procedure: ESOPHAGOGASTRODUODENOSCOPY (EGD) WITH PROPOFOL  and Dilation;  Surgeon: Jinny Carmine, MD;  Location: [facility];  Service: Endoscopy;  Laterality: N/A;   ESOPHAGOGASTRODUODENOSCOPY (EGD) WITH PROPOFOL  N/A 06/15/2022   Procedure: ESOPHAGOGASTRODUODENOSCOPY (EGD) WITH PROPOFOL ;  Surgeon: Jinny Carmine, MD;  Location: [facility];  Service: Endoscopy;  Laterality: N/A;   EXCISION [facility] NE[facility]OMA  [id number]   Dr. Donnice Cory   HERNIA REPAIR     lumb hernia     RENAL CYST EXCISION     TUBAL LIGATION     Family History  Problem Relation Age of Onset   Cancer Father        prostate   Lung cancer Other        parent   Hypercholesterolemia Other        parent   Social History   Socioeconomic History   Marital status: Married    Spouse name: Not on file   Number of children: 2   Years of education: Not  on file   Highest education level: 12th grade  Occupational History   Not on file  Tobacco Use   Smoking status: Former   Smokeless tobacco: Never  Vaping Use   Vaping status: Not on file  Substance and Sexual Activity   Alcohol use: Yes    Alcohol/week: 5.0 - 6.0 standard drinks of alcohol    Types: 5 - 6 Glasses of wine per week   Drug use: No   Sexual activity: Not on file  Other Topics Concern   Not on file  Social History Narrative   Not on file   Social Drivers of Health   Financial Resource Strain: Low Risk  (08/15/2023)   Overall Financial Resource Strain (CARDIA)    Difficulty of Paying Living Expenses: Not hard at all  Food Insecurity: No Food Insecurity (08/15/2023)   Hunger Vital Sign    Worried About  Running Out of Food in the Last Year: Never true    Ran Out of Food in the Last Year: Never true  Transportation Needs: No Transportation Needs (08/15/2023)   PRAPARE - Administrator, Civil Service (Medical): No    Lack of Transportation (Non-Medical): No  Physical Activity: Inactive (08/15/2023)   Exercise Vital Sign    Days of Exercise per Week: 0 days    Minutes of Exercise per Session: Not on file  Stress: No Stress Concern Present (08/15/2023)   [employer] of Occupational Health - Occupational Stress Questionnaire    Feeling of Stress: Only a little  Social Connections: Socially Integrated (08/15/2023)   Social Connection and Isolation Panel    Frequency of Communication with Friends and Family: Twice a week    Frequency of Social Gatherings with Friends and Family: Once a week    Attends Religious Services: More than 4 times per year    Active Member of [employer] or Organizations: Yes    Attends Banker Meetings: 1 to 4 times per year    Marital Status: Married     Review of Systems  Constitutional:  Negative for appetite change and unexpected weight change.  HENT:  Negative for congestion and sinus pressure.   Respiratory:  Negative for cough, chest tightness and shortness of breath.   Cardiovascular:  Negative for chest pain, palpitations and leg swelling.  Gastrointestinal:  Negative for abdominal pain, nausea and vomiting.  Genitourinary:  Negative for difficulty urinating and dysuria.  Musculoskeletal:  Negative for joint swelling and myalgias.  Skin:  Negative for color change and rash.  Neurological:  Negative for dizziness and headaches.  Psychiatric/Behavioral:  Negative for agitation and dysphoric mood.        Objective:     BP 108/68   Pulse 76   Resp 16   Ht 5' 1 (1.549 m)   Wt 147 lb (66.7 kg)   LMP 02/28/1990   SpO2 98%   BMI 27.78 kg/m  Wt Readings from Last 3 Encounters:  11/15/23 147 lb (66.7 kg)  08/15/23 141 lb 12.8 oz  (64.3 kg)  08/01/23 140 lb (63.5 kg)    Physical Exam Vitals reviewed.  Constitutional:      General: She is not in acute distress.    Appearance: Normal appearance.  HENT:     Head: Normocephalic and atraumatic.     Right Ear: External ear normal.     Left Ear: External ear normal.     Mouth/Throat:     Pharynx: No oropharyngeal exudate or posterior oropharyngeal  erythema.  Eyes:     General: No scleral icterus.       Right eye: No discharge.        Left eye: No discharge.     Conjunctiva/sclera: Conjunctivae normal.  Neck:     Thyroid : No thyromegaly.  Cardiovascular:     Rate and Rhythm: Normal rate and regular rhythm.  Pulmonary:     Effort: No respiratory distress.     Breath sounds: Normal breath sounds. No wheezing.  Abdominal:     General: Bowel sounds are normal.     Palpations: Abdomen is soft.     Tenderness: There is no abdominal tenderness.  Musculoskeletal:        General: No swelling or tenderness.     Cervical back: Neck supple. No tenderness.  Lymphadenopathy:     Cervical: No cervical adenopathy.  Skin:    Findings: No erythema or rash.  Neurological:     Mental Status: She is alert.  Psychiatric:        Mood and Affect: Mood normal.        Behavior: Behavior normal.         Outpatient Encounter Medications as of 11/15/2023  Medication Sig   ALPRAZolam  (XANAX ) 0.25 MG tablet Take 1 tablet (0.25 mg total) by mouth daily as needed.   esomeprazole  (NEXIUM ) 40 MG capsule Take 1 capsule (40 mg total) by mouth 2 (two) times daily before a meal.   sertraline  (ZOLOFT ) 50 MG tablet TAKE 1 TABLET BY MOUTH DAILY   traZODone  (DESYREL ) 50 MG tablet TAKE 1/2 TABLET BY MOUTH AT BEDTIME   valACYclovir (VALTREX) 500 MG tablet Take 500 mg by mouth 2 (two) times daily.   [DISCONTINUED] benzonatate  (TESSALON ) 200 MG capsule Take 1 capsule (200 mg total) by mouth 2 (two) times daily as needed for cough.   [DISCONTINUED] chlorpheniramine-HYDROcodone (TUSSIONEX)  10-8 MG/5ML Take 5 mLs by mouth every 12 (twelve) hours as needed for cough.   [DISCONTINUED] doxycycline  (VIBRA -TABS) 100 MG tablet Take 1 tablet (100 mg total) by mouth 2 (two) times daily.   [DISCONTINUED] fluticasone  (FLONASE ) 50 MCG/ACT nasal spray PLACE 2 SPRAYS INTO BOTH NOSTRILS DAILY.   No facility-administered encounter medications on file as of 11/15/2023.     Lab Results  Component Value Date   WBC 7.1 03/10/2023   HGB 13.5 03/10/2023   HCT 40.4 03/10/2023   PLT 238.0 03/10/2023   GLUCOSE 102 (H) 11/15/2023   CHOL 225 (H) 11/15/2023   TRIG 387.0 (H) 11/15/2023   HDL 47.30 11/15/2023   LDLDIRECT 132.0 04/21/2022   LDLCALC 101 (H) 11/15/2023   ALT 31 11/15/2023   AST 22 11/15/2023   NA 139 11/15/2023   K 4.1 11/15/2023   CL 103 11/15/2023   CREATININE 0.72 11/15/2023   BUN 20 11/15/2023   CO2 28 11/15/2023   TSH 2.50 03/10/2023   HGBA1C 5.4 11/15/2023    MR ANGIO HEAD WO CONTRAST Result Date: 07/06/2023 CLINICAL DATA:  Pulsatile tinnitus EXAM: MRA HEAD WITHOUT CONTRAST TECHNIQUE: Angiographic images of the Circle of Willis were acquired using MRA technique without intravenous contrast. COMPARISON:  MRI of the brain dated the same day FINDINGS: Anterior circulation: The intracranial internal carotid arteries are normal in course and caliber. The anterior cerebral arteries are normal in course and caliber. The middle cerebral arteries are normal in course and caliber. Posterior circulation: The posterior cerebral arteries are normal in course and caliber. The intradural vertebral arteries are normal in course and caliber.  The basilar artery is normal in course and caliber. The vertebral arteries are normal in course and caliber. Anatomic variants: None Other: None. IMPRESSION: No high-grade stenosis, occlusion, or aneurysm in the intracranial vasculature. No vascular aberration to explain the patient's tinnitus. Electronically Signed   By: Clem Savory M.D.   On: 07/06/2023  10:56   MR BRAIN W WO CONTRAST Result Date: 06/19/2023 EXAMINATION: MR BRAIN W WO CONTRAST HISTORY: Pulsatile tinnitus TECHNIQUE: MRI of the brain performed with and without IV contrast. CONTRAST: 7 mL Vueway  IV COMPARISON: None FINDINGS: No evidence for intracranial mass, hemorrhage, or acute infarct. There are mild bilateral periventricular and subcortical white matter T2 hyperintensities, which are nonspecific, but most likely secondary to chronic microvascular ischemia. The ventricles are symmetric and the basilar cisterns are patent. The paranasal sinuses and mastoid air cells appear well aerated other than a trace right mastoid effusion. The orbits are normal. No abnormal enhancement is identified following the administration of intravenous contrast material. IMPRESSION: No acute intracranial findings. Mild chronic microvascular ischemic changes. Electronically signed by: [place] Engel MD 06/19/2023 02:13 PM EDT RP Workstation: [id number]       Assessment & Plan:  Hypercholesterolemia Assessment & Plan: Was on crestor . Had persistent aching. Crestor  stopped. Better off. Now on pravastatin . Tolerating three days per week. Follow.   The 10-year ASCVD risk score (Arnett DK, et al., 2019) is: 8.9%   Values used to calculate the score:     Age: 65 years     Clincally relevant sex: Female     Is Non-Hispanic African American: No     Diabetic: No     Tobacco smoker: No     Systolic Blood Pressure: 108 mmHg     Is BP treated: No     HDL Cholesterol: 47.3 mg/dL     Total Cholesterol: 225 mg/dL   Orders: -     Hepatic function panel -     Lipid panel -     Basic metabolic panel with GFR  Hyperglycemia Assessment & Plan: Low carb diet and exercise. Follow met b and A1c.   Orders: -     Hemoglobin A1c  Arm skin lesion, right Assessment & Plan: Right forearm lesion. Referral to Dr Arlyss for evaluation. Has seen previously.   Orders: -     Ambulatory referral to Dermatology  Left knee  pain, unspecified chronicity -     Ambulatory referral to Physical Therapy  Hernia of flank Assessment & Plan:  Recently evaluated by surgery - recurrent left flank bulge. CT - no hernia. Mesh is intact. Felt the bulge was likely due to eventration, which was felt coule be managed with core strengthening exercises. Surgery recommended pelvic floor therapy. Discussed. Request Chyrl Deal.   Orders: -     Ambulatory referral to Physical Therapy  Stress Assessment & Plan: Appears to be doing well on zoloft  and trazodone . Follow.    Renal cyst Assessment & Plan: Dr Penne (10/2021) - Bilateral renal cyst of varying complexity, primarily Bosniak 1 and 2.  Recommend f/u renal ultrasound in 2 years. Just had CT - with mention of cysts left kidney. Needs f/u with urology.    Colon cancer screening Assessment & Plan: Colonoscopy 10/26/19 - pathology - tubular adenoma. Recommended f/u colonoscopy in 7 years.     Alcohol use (HCC) Assessment & Plan: Has decreased alcohol intake.    Abnormal liver function test Assessment & Plan: Check liver panel today to confirm remaining wnl.  Allena Hamilton, MD

## 2023-11-16 ENCOUNTER — Ambulatory Visit: Payer: Self-pay | Admitting: Internal Medicine

## 2023-11-17 DIAGNOSIS — D485 Neoplasm of uncertain behavior of skin: Secondary | ICD-10-CM | POA: Diagnosis not present

## 2023-11-17 DIAGNOSIS — C44622 Squamous cell carcinoma of skin of right upper limb, including shoulder: Secondary | ICD-10-CM | POA: Diagnosis not present

## 2023-11-20 ENCOUNTER — Encounter: Payer: Self-pay | Admitting: Internal Medicine

## 2023-11-20 ENCOUNTER — Telehealth: Payer: Self-pay | Admitting: Internal Medicine

## 2023-11-20 NOTE — Assessment & Plan Note (Signed)
Colonoscopy 10/26/19 - pathology - tubular adenoma. Recommended f/u colonoscopy in 7 years.

## 2023-11-20 NOTE — Assessment & Plan Note (Signed)
 Check liver panel today to confirm remaining wnl.

## 2023-11-20 NOTE — Assessment & Plan Note (Signed)
 Appears to be doing well on zoloft  and trazodone . Follow.

## 2023-11-20 NOTE — Telephone Encounter (Signed)
 Please call Ms Cajas and let her know that after reviewing her chart (after she left) - she previously saw Dr Penne - f/u kidney cysts. Dr Penne had recommended f/u in two years. See if agreeable for f/u with urology (or at least f/u renal ultrasound). Let me know and I can order.

## 2023-11-20 NOTE — Assessment & Plan Note (Signed)
 Recently evaluated by surgery - recurrent left flank bulge. CT - no hernia. Mesh is intact. Felt the bulge was likely due to eventration, which was felt coule be managed with core strengthening exercises. Surgery recommended pelvic floor therapy. Discussed. Request Chyrl Deal.

## 2023-11-20 NOTE — Assessment & Plan Note (Signed)
 Was on crestor . Had persistent aching. Crestor  stopped. Better off. Now on pravastatin . Tolerating three days per week. Follow.   The 10-year ASCVD risk score (Arnett DK, et al., 2019) is: 8.9%   Values used to calculate the score:     Age: 73 years     Clincally relevant sex: Female     Is Non-Hispanic African American: No     Diabetic: No     Tobacco smoker: No     Systolic Blood Pressure: 108 mmHg     Is BP treated: No     HDL Cholesterol: 47.3 mg/dL     Total Cholesterol: 225 mg/dL

## 2023-11-20 NOTE — Assessment & Plan Note (Signed)
 Right forearm lesion. Referral to Dr Arlyss for evaluation. Has seen previously.

## 2023-11-20 NOTE — Assessment & Plan Note (Signed)
 Has decreased alcohol intake

## 2023-11-20 NOTE — Assessment & Plan Note (Signed)
 Dr Penne (10/2021) - Bilateral renal cyst of varying complexity, primarily Bosniak 1 and 2.  Recommend f/u renal ultrasound in 2 years. Just had CT - with mention of cysts left kidney. Needs f/u with urology.

## 2023-11-20 NOTE — Assessment & Plan Note (Signed)
 Low-carb diet and exercise.  Follow met b and A1c.

## 2023-11-21 NOTE — Telephone Encounter (Signed)
 Patient says that Dr Darrelyn discussed this with her and told her that renal cysts were still present but unchanged according to her CT scan of her abdomen. Pt says she is agreeable to see urology but does not think it is necessary right now, maybe in the near future.

## 2023-11-30 ENCOUNTER — Encounter: Payer: Self-pay | Admitting: Internal Medicine

## 2023-11-30 DIAGNOSIS — M7522 Bicipital tendinitis, left shoulder: Secondary | ICD-10-CM | POA: Diagnosis not present

## 2023-11-30 DIAGNOSIS — M25512 Pain in left shoulder: Secondary | ICD-10-CM | POA: Insufficient documentation

## 2023-12-02 DIAGNOSIS — M6281 Muscle weakness (generalized): Secondary | ICD-10-CM | POA: Diagnosis not present

## 2023-12-02 DIAGNOSIS — M1712 Unilateral primary osteoarthritis, left knee: Secondary | ICD-10-CM | POA: Diagnosis not present

## 2023-12-07 DIAGNOSIS — M1712 Unilateral primary osteoarthritis, left knee: Secondary | ICD-10-CM | POA: Diagnosis not present

## 2023-12-07 DIAGNOSIS — M6281 Muscle weakness (generalized): Secondary | ICD-10-CM | POA: Diagnosis not present

## 2023-12-09 DIAGNOSIS — M1712 Unilateral primary osteoarthritis, left knee: Secondary | ICD-10-CM | POA: Diagnosis not present

## 2023-12-09 DIAGNOSIS — M6281 Muscle weakness (generalized): Secondary | ICD-10-CM | POA: Diagnosis not present

## 2023-12-12 DIAGNOSIS — R293 Abnormal posture: Secondary | ICD-10-CM | POA: Diagnosis not present

## 2023-12-12 DIAGNOSIS — S46092D Other injury of muscle(s) and tendon(s) of the rotator cuff of left shoulder, subsequent encounter: Secondary | ICD-10-CM | POA: Diagnosis not present

## 2023-12-12 DIAGNOSIS — M25512 Pain in left shoulder: Secondary | ICD-10-CM | POA: Diagnosis not present

## 2023-12-13 DIAGNOSIS — C44622 Squamous cell carcinoma of skin of right upper limb, including shoulder: Secondary | ICD-10-CM | POA: Diagnosis not present

## 2023-12-19 DIAGNOSIS — S46212D Strain of muscle, fascia and tendon of other parts of biceps, left arm, subsequent encounter: Secondary | ICD-10-CM | POA: Diagnosis not present

## 2023-12-20 DIAGNOSIS — M25561 Pain in right knee: Secondary | ICD-10-CM | POA: Diagnosis not present

## 2023-12-20 DIAGNOSIS — M25562 Pain in left knee: Secondary | ICD-10-CM | POA: Diagnosis not present

## 2023-12-20 DIAGNOSIS — M170 Bilateral primary osteoarthritis of knee: Secondary | ICD-10-CM | POA: Diagnosis not present

## 2023-12-28 ENCOUNTER — Other Ambulatory Visit: Payer: Self-pay | Admitting: Internal Medicine

## 2023-12-29 DIAGNOSIS — M25512 Pain in left shoulder: Secondary | ICD-10-CM | POA: Diagnosis not present

## 2023-12-29 DIAGNOSIS — S46092D Other injury of muscle(s) and tendon(s) of the rotator cuff of left shoulder, subsequent encounter: Secondary | ICD-10-CM | POA: Diagnosis not present

## 2023-12-29 DIAGNOSIS — R293 Abnormal posture: Secondary | ICD-10-CM | POA: Diagnosis not present

## 2024-01-02 DIAGNOSIS — M25512 Pain in left shoulder: Secondary | ICD-10-CM | POA: Diagnosis not present

## 2024-01-02 DIAGNOSIS — S46092D Other injury of muscle(s) and tendon(s) of the rotator cuff of left shoulder, subsequent encounter: Secondary | ICD-10-CM | POA: Diagnosis not present

## 2024-01-02 DIAGNOSIS — R293 Abnormal posture: Secondary | ICD-10-CM | POA: Diagnosis not present

## 2024-01-09 DIAGNOSIS — M25512 Pain in left shoulder: Secondary | ICD-10-CM | POA: Diagnosis not present

## 2024-01-24 DIAGNOSIS — M170 Bilateral primary osteoarthritis of knee: Secondary | ICD-10-CM | POA: Diagnosis not present

## 2024-02-19 ENCOUNTER — Encounter: Payer: Self-pay | Admitting: Internal Medicine

## 2024-02-28 ENCOUNTER — Other Ambulatory Visit: Payer: Self-pay | Admitting: Internal Medicine

## 2024-02-28 ENCOUNTER — Other Ambulatory Visit: Payer: Self-pay

## 2024-02-28 ENCOUNTER — Encounter: Payer: Self-pay | Admitting: Internal Medicine

## 2024-02-29 MED ORDER — TRAZODONE HCL 50 MG PO TABS: 25.0000 mg | ORAL_TABLET | Freq: Every day | ORAL | 1 refills | Status: AC

## 2024-03-19 ENCOUNTER — Ambulatory Visit: Admitting: Internal Medicine
